# Patient Record
Sex: Female | Born: 1950 | Race: White | Hispanic: No | Marital: Married | Smoking: Current every day smoker
Health system: Southern US, Community
[De-identification: ages and names within clinical notes are randomized; demographics above are authoritative.]

## PROBLEM LIST (undated history)

## (undated) DIAGNOSIS — M1990 Unspecified osteoarthritis, unspecified site: Secondary | ICD-10-CM

## (undated) DIAGNOSIS — M545 Low back pain, unspecified: Secondary | ICD-10-CM

## (undated) DIAGNOSIS — Z8709 Personal history of other diseases of the respiratory system: Secondary | ICD-10-CM

## (undated) DIAGNOSIS — K635 Polyp of colon: Secondary | ICD-10-CM

## (undated) DIAGNOSIS — D649 Anemia, unspecified: Secondary | ICD-10-CM

## (undated) DIAGNOSIS — F32A Depression, unspecified: Secondary | ICD-10-CM

## (undated) DIAGNOSIS — C50919 Malignant neoplasm of unspecified site of unspecified female breast: Secondary | ICD-10-CM

## (undated) DIAGNOSIS — R0603 Acute respiratory distress: Secondary | ICD-10-CM

## (undated) DIAGNOSIS — G4700 Insomnia, unspecified: Secondary | ICD-10-CM

## (undated) DIAGNOSIS — F909 Attention-deficit hyperactivity disorder, unspecified type: Secondary | ICD-10-CM

## (undated) DIAGNOSIS — F329 Major depressive disorder, single episode, unspecified: Secondary | ICD-10-CM

## (undated) DIAGNOSIS — M300 Polyarteritis nodosa: Secondary | ICD-10-CM

## (undated) DIAGNOSIS — K648 Other hemorrhoids: Secondary | ICD-10-CM

## (undated) DIAGNOSIS — J189 Pneumonia, unspecified organism: Secondary | ICD-10-CM

## (undated) DIAGNOSIS — G8929 Other chronic pain: Secondary | ICD-10-CM

## (undated) DIAGNOSIS — K5790 Diverticulosis of intestine, part unspecified, without perforation or abscess without bleeding: Secondary | ICD-10-CM

## (undated) DIAGNOSIS — K219 Gastro-esophageal reflux disease without esophagitis: Secondary | ICD-10-CM

## (undated) DIAGNOSIS — F419 Anxiety disorder, unspecified: Secondary | ICD-10-CM

## (undated) DIAGNOSIS — IMO0002 Reserved for concepts with insufficient information to code with codable children: Secondary | ICD-10-CM

## (undated) DIAGNOSIS — R519 Headache, unspecified: Secondary | ICD-10-CM

## (undated) DIAGNOSIS — M7070 Other bursitis of hip, unspecified hip: Secondary | ICD-10-CM

## (undated) DIAGNOSIS — G4730 Sleep apnea, unspecified: Secondary | ICD-10-CM

## (undated) DIAGNOSIS — R51 Headache: Secondary | ICD-10-CM

## (undated) DIAGNOSIS — Z9221 Personal history of antineoplastic chemotherapy: Secondary | ICD-10-CM

## (undated) DIAGNOSIS — R911 Solitary pulmonary nodule: Secondary | ICD-10-CM

## (undated) HISTORY — PX: EYE SURGERY: SHX253

## (undated) HISTORY — PX: FRACTURE SURGERY: SHX138

## (undated) HISTORY — PX: JOINT REPLACEMENT: SHX530

## (undated) HISTORY — DX: Depression, unspecified: F32.A

## (undated) HISTORY — PX: CATARACT EXTRACTION, BILATERAL: SHX1313

## (undated) HISTORY — PX: TOTAL SHOULDER REPLACEMENT: SUR1217

## (undated) HISTORY — DX: Other hemorrhoids: K64.8

## (undated) HISTORY — DX: Attention-deficit hyperactivity disorder, unspecified type: F90.9

## (undated) HISTORY — DX: Major depressive disorder, single episode, unspecified: F32.9

## (undated) HISTORY — DX: Diverticulosis of intestine, part unspecified, without perforation or abscess without bleeding: K57.90

## (undated) HISTORY — DX: Polyp of colon: K63.5

---

## 1969-02-14 HISTORY — PX: CHOLECYSTECTOMY: SHX55

## 1974-02-14 HISTORY — PX: ABDOMINAL HYSTERECTOMY: SHX81

## 1975-02-15 HISTORY — PX: APPENDECTOMY: SHX54

## 1999-02-15 DIAGNOSIS — Z9221 Personal history of antineoplastic chemotherapy: Secondary | ICD-10-CM

## 1999-02-15 HISTORY — DX: Personal history of antineoplastic chemotherapy: Z92.21

## 1999-02-15 HISTORY — PX: BREAST BIOPSY: SHX20

## 1999-02-15 HISTORY — PX: PORT-A-CATH REMOVAL: SHX5289

## 1999-02-15 HISTORY — PX: MASTECTOMY: SHX3

## 1999-04-05 ENCOUNTER — Other Ambulatory Visit: Admission: RE | Admit: 1999-04-05 | Discharge: 1999-04-05 | Payer: Self-pay | Admitting: General Surgery

## 1999-04-12 ENCOUNTER — Encounter (INDEPENDENT_AMBULATORY_CARE_PROVIDER_SITE_OTHER): Payer: Self-pay | Admitting: *Deleted

## 1999-04-12 ENCOUNTER — Ambulatory Visit (HOSPITAL_BASED_OUTPATIENT_CLINIC_OR_DEPARTMENT_OTHER): Admission: RE | Admit: 1999-04-12 | Discharge: 1999-04-12 | Payer: Self-pay | Admitting: General Surgery

## 1999-05-03 ENCOUNTER — Encounter: Payer: Self-pay | Admitting: General Surgery

## 1999-05-03 ENCOUNTER — Encounter (INDEPENDENT_AMBULATORY_CARE_PROVIDER_SITE_OTHER): Payer: Self-pay | Admitting: *Deleted

## 1999-05-04 ENCOUNTER — Inpatient Hospital Stay (HOSPITAL_COMMUNITY): Admission: RE | Admit: 1999-05-04 | Discharge: 1999-05-05 | Payer: Self-pay | Admitting: General Surgery

## 1999-06-07 ENCOUNTER — Encounter: Payer: Self-pay | Admitting: Oncology

## 1999-06-07 ENCOUNTER — Ambulatory Visit (HOSPITAL_COMMUNITY): Admission: RE | Admit: 1999-06-07 | Discharge: 1999-06-07 | Payer: Self-pay | Admitting: Oncology

## 1999-06-16 ENCOUNTER — Ambulatory Visit (HOSPITAL_BASED_OUTPATIENT_CLINIC_OR_DEPARTMENT_OTHER): Admission: RE | Admit: 1999-06-16 | Discharge: 1999-06-16 | Payer: Self-pay | Admitting: General Surgery

## 1999-06-16 ENCOUNTER — Encounter: Payer: Self-pay | Admitting: General Surgery

## 1999-09-07 ENCOUNTER — Ambulatory Visit (HOSPITAL_BASED_OUTPATIENT_CLINIC_OR_DEPARTMENT_OTHER): Admission: RE | Admit: 1999-09-07 | Discharge: 1999-09-07 | Payer: Self-pay | Admitting: General Surgery

## 1999-10-06 ENCOUNTER — Ambulatory Visit (HOSPITAL_BASED_OUTPATIENT_CLINIC_OR_DEPARTMENT_OTHER): Admission: RE | Admit: 1999-10-06 | Discharge: 1999-10-06 | Payer: Self-pay | Admitting: Plastic Surgery

## 2000-01-18 ENCOUNTER — Ambulatory Visit (HOSPITAL_BASED_OUTPATIENT_CLINIC_OR_DEPARTMENT_OTHER): Admission: RE | Admit: 2000-01-18 | Discharge: 2000-01-18 | Payer: Self-pay | Admitting: Plastic Surgery

## 2000-03-02 ENCOUNTER — Encounter: Admission: RE | Admit: 2000-03-02 | Discharge: 2000-05-31 | Payer: Self-pay | Admitting: General Surgery

## 2000-03-03 ENCOUNTER — Other Ambulatory Visit: Admission: RE | Admit: 2000-03-03 | Discharge: 2000-03-03 | Payer: Self-pay | Admitting: *Deleted

## 2000-03-07 ENCOUNTER — Encounter: Admission: RE | Admit: 2000-03-07 | Discharge: 2000-03-07 | Payer: Self-pay | Admitting: *Deleted

## 2000-03-21 ENCOUNTER — Encounter: Admission: RE | Admit: 2000-03-21 | Discharge: 2000-03-21 | Payer: Self-pay | Admitting: *Deleted

## 2000-06-26 ENCOUNTER — Encounter (INDEPENDENT_AMBULATORY_CARE_PROVIDER_SITE_OTHER): Payer: Self-pay | Admitting: Specialist

## 2000-06-26 ENCOUNTER — Ambulatory Visit (HOSPITAL_COMMUNITY): Admission: RE | Admit: 2000-06-26 | Discharge: 2000-06-26 | Payer: Self-pay | Admitting: Gastroenterology

## 2004-07-29 ENCOUNTER — Ambulatory Visit: Payer: Self-pay | Admitting: Oncology

## 2005-01-27 ENCOUNTER — Ambulatory Visit: Payer: Self-pay | Admitting: Oncology

## 2005-04-15 ENCOUNTER — Ambulatory Visit: Payer: Self-pay | Admitting: Oncology

## 2005-10-25 ENCOUNTER — Ambulatory Visit: Payer: Self-pay | Admitting: Oncology

## 2005-12-22 ENCOUNTER — Ambulatory Visit: Payer: Self-pay | Admitting: Oncology

## 2005-12-26 ENCOUNTER — Encounter: Payer: Self-pay | Admitting: Cardiology

## 2005-12-26 ENCOUNTER — Ambulatory Visit: Payer: Self-pay

## 2005-12-26 LAB — RESEARCH LABS

## 2006-03-23 ENCOUNTER — Encounter: Payer: Self-pay | Admitting: Cardiology

## 2006-03-23 ENCOUNTER — Ambulatory Visit: Payer: Self-pay | Admitting: Oncology

## 2006-03-23 ENCOUNTER — Ambulatory Visit: Payer: Self-pay

## 2006-06-15 HISTORY — PX: ORIF SHOULDER FRACTURE: SHX5035

## 2006-06-27 ENCOUNTER — Ambulatory Visit: Payer: Self-pay | Admitting: Oncology

## 2006-06-29 ENCOUNTER — Encounter: Payer: Self-pay | Admitting: Cardiovascular Disease

## 2006-06-29 ENCOUNTER — Ambulatory Visit: Payer: Self-pay

## 2006-09-26 ENCOUNTER — Ambulatory Visit: Payer: Self-pay | Admitting: Oncology

## 2006-09-29 ENCOUNTER — Ambulatory Visit: Payer: Self-pay

## 2006-09-29 LAB — RESEARCH LABS

## 2006-10-16 HISTORY — PX: SHOULDER SURGERY: SHX246

## 2006-11-08 ENCOUNTER — Ambulatory Visit: Payer: Self-pay | Admitting: Oncology

## 2006-11-13 LAB — RESEARCH LABS

## 2006-12-16 HISTORY — PX: TOTAL KNEE ARTHROPLASTY: SHX125

## 2006-12-26 ENCOUNTER — Ambulatory Visit: Payer: Self-pay | Admitting: Oncology

## 2007-05-17 ENCOUNTER — Ambulatory Visit: Payer: Self-pay | Admitting: Oncology

## 2007-12-11 ENCOUNTER — Ambulatory Visit: Payer: Self-pay | Admitting: Oncology

## 2007-12-13 LAB — CBC WITH DIFFERENTIAL/PLATELET
BASO%: 0.5 % (ref 0.0–2.0)
EOS%: 2.9 % (ref 0.0–7.0)
Eosinophils Absolute: 0.2 10*3/uL (ref 0.0–0.5)
HCT: 36.4 % (ref 34.8–46.6)
MCH: 30.7 pg (ref 26.0–34.0)
MCHC: 33.9 g/dL (ref 32.0–36.0)
MCV: 90.7 fL (ref 81.0–101.0)
MONO#: 0.7 10*3/uL (ref 0.1–0.9)
NEUT%: 54.8 % (ref 39.6–76.8)
RBC: 4.01 10*6/uL (ref 3.70–5.32)
WBC: 7.6 10*3/uL (ref 3.9–10.0)
lymph#: 2.5 10*3/uL (ref 0.9–3.3)

## 2007-12-31 LAB — CBC WITH DIFFERENTIAL/PLATELET
BASO%: 0.5 % (ref 0.0–2.0)
Basophils Absolute: 0 10*3/uL (ref 0.0–0.1)
EOS%: 2.9 % (ref 0.0–7.0)
HCT: 36.3 % (ref 34.8–46.6)
LYMPH%: 33.5 % (ref 14.0–48.0)
MCH: 30.8 pg (ref 26.0–34.0)
MCHC: 34.5 g/dL (ref 32.0–36.0)
MCV: 89.2 fL (ref 81.0–101.0)
MONO#: 0.6 10*3/uL (ref 0.1–0.9)
MONO%: 7.4 % (ref 0.0–13.0)
NEUT#: 4.6 10*3/uL (ref 1.5–6.5)
NEUT%: 55.7 % (ref 39.6–76.8)
Platelets: 194 10*3/uL (ref 145–400)
RBC: 4.07 10*6/uL (ref 3.70–5.32)

## 2008-04-04 ENCOUNTER — Encounter: Admission: RE | Admit: 2008-04-04 | Discharge: 2008-04-04 | Payer: Self-pay | Admitting: Gastroenterology

## 2008-04-24 ENCOUNTER — Ambulatory Visit (HOSPITAL_COMMUNITY): Admission: RE | Admit: 2008-04-24 | Discharge: 2008-04-24 | Payer: Self-pay | Admitting: Gastroenterology

## 2008-04-24 ENCOUNTER — Encounter (INDEPENDENT_AMBULATORY_CARE_PROVIDER_SITE_OTHER): Payer: Self-pay | Admitting: Gastroenterology

## 2008-06-27 ENCOUNTER — Ambulatory Visit: Payer: Self-pay | Admitting: Oncology

## 2008-09-11 ENCOUNTER — Encounter: Admission: RE | Admit: 2008-09-11 | Discharge: 2008-09-11 | Payer: Self-pay | Admitting: Gastroenterology

## 2008-09-17 ENCOUNTER — Ambulatory Visit (HOSPITAL_COMMUNITY): Admission: RE | Admit: 2008-09-17 | Discharge: 2008-09-17 | Payer: Self-pay | Admitting: Gastroenterology

## 2008-12-26 ENCOUNTER — Ambulatory Visit: Payer: Self-pay | Admitting: Oncology

## 2009-01-26 ENCOUNTER — Ambulatory Visit: Payer: Self-pay | Admitting: Oncology

## 2009-03-05 ENCOUNTER — Emergency Department (HOSPITAL_BASED_OUTPATIENT_CLINIC_OR_DEPARTMENT_OTHER): Admission: EM | Admit: 2009-03-05 | Discharge: 2009-03-05 | Payer: Self-pay | Admitting: Emergency Medicine

## 2009-03-05 ENCOUNTER — Ambulatory Visit: Payer: Self-pay | Admitting: Diagnostic Radiology

## 2009-08-24 ENCOUNTER — Emergency Department (HOSPITAL_BASED_OUTPATIENT_CLINIC_OR_DEPARTMENT_OTHER): Admission: EM | Admit: 2009-08-24 | Discharge: 2009-08-24 | Payer: Self-pay | Admitting: Emergency Medicine

## 2009-08-24 ENCOUNTER — Ambulatory Visit: Payer: Self-pay | Admitting: Diagnostic Radiology

## 2009-09-09 ENCOUNTER — Ambulatory Visit: Payer: Self-pay | Admitting: Oncology

## 2009-11-12 ENCOUNTER — Ambulatory Visit: Payer: Self-pay | Admitting: Diagnostic Radiology

## 2009-11-12 ENCOUNTER — Emergency Department (HOSPITAL_BASED_OUTPATIENT_CLINIC_OR_DEPARTMENT_OTHER): Admission: EM | Admit: 2009-11-12 | Discharge: 2009-11-12 | Payer: Self-pay | Admitting: Emergency Medicine

## 2010-03-18 ENCOUNTER — Encounter (HOSPITAL_BASED_OUTPATIENT_CLINIC_OR_DEPARTMENT_OTHER): Payer: Medicare Other

## 2010-03-18 DIAGNOSIS — C50919 Malignant neoplasm of unspecified site of unspecified female breast: Secondary | ICD-10-CM

## 2010-04-29 LAB — DIFFERENTIAL
Basophils Relative: 1 % (ref 0–1)
Eosinophils Absolute: 0.1 10*3/uL (ref 0.0–0.7)
Eosinophils Relative: 2 % (ref 0–5)
Lymphocytes Relative: 34 % (ref 12–46)
Lymphs Abs: 1.7 10*3/uL (ref 0.7–4.0)
Monocytes Absolute: 0.5 10*3/uL (ref 0.1–1.0)
Monocytes Relative: 11 % (ref 3–12)
Neutro Abs: 2.6 10*3/uL (ref 1.7–7.7)
Neutrophils Relative %: 52 % (ref 43–77)

## 2010-04-29 LAB — POCT CARDIAC MARKERS
CKMB, poc: <1 ng/mL — ABNORMAL LOW (ref 1.0–8.0)
Myoglobin, poc: 11.9 ng/mL — ABNORMAL LOW (ref 12–200)
Troponin i, poc: <0.05 ng/mL (ref 0.00–0.09)

## 2010-04-29 LAB — URINALYSIS, ROUTINE W REFLEX MICROSCOPIC
Glucose, UA: NEGATIVE mg/dL
Nitrite: NEGATIVE
Specific Gravity, Urine: 1.008 (ref 1.005–1.030)

## 2010-04-29 LAB — BASIC METABOLIC PANEL
BUN: 11 mg/dL (ref 6–23)
CO2: 29 mEq/L (ref 19–32)
Calcium: 8.8 mg/dL (ref 8.4–10.5)
Chloride: 102 mEq/L (ref 96–112)
GFR calc non Af Amer: >60 mL/min (ref >60)
Potassium: 4.4 mEq/L (ref 3.5–5.1)
Sodium: 137 mEq/L (ref 135–145)

## 2010-04-29 LAB — CBC
HCT: 34.1 % — ABNORMAL LOW (ref 36.0–46.0)
MCH: 29.6 pg (ref 26.0–34.0)
MCHC: 33.6 g/dL (ref 30.0–36.0)
RBC: 3.87 MIL/uL (ref 3.87–5.11)
WBC: 4.9 10*3/uL (ref 4.0–10.5)

## 2010-05-02 LAB — CBC
HCT: 38.6 % (ref 36.0–46.0)
Hemoglobin: 12.5 g/dL (ref 12.0–15.0)
MCH: 29.4 pg (ref 26.0–34.0)
MCHC: 32.3 g/dL (ref 30.0–36.0)
MCV: 90.8 fL (ref 78.0–100.0)
Platelets: 306 10*3/uL (ref 150–400)
RBC: 4.25 MIL/uL (ref 3.87–5.11)
RDW: 14.4 % (ref 11.5–15.5)
WBC: 8.5 10*3/uL (ref 4.0–10.5)

## 2010-05-02 LAB — DIFFERENTIAL
Basophils Relative: 1 % (ref 0–1)
Eosinophils Absolute: 0 10*3/uL (ref 0.0–0.7)
Eosinophils Relative: 0 % (ref 0–5)
Lymphs Abs: 1.5 10*3/uL (ref 0.7–4.0)
Monocytes Absolute: 0.5 10*3/uL (ref 0.1–1.0)
Monocytes Relative: 6 % (ref 3–12)
Neutro Abs: 6.4 10*3/uL (ref 1.7–7.7)

## 2010-05-02 LAB — BASIC METABOLIC PANEL
Chloride: 103 mEq/L (ref 96–112)
GFR calc Af Amer: >60 mL/min (ref >60)
GFR calc non Af Amer: >60 mL/min (ref >60)

## 2010-05-02 LAB — D-DIMER, QUANTITATIVE: D-Dimer, Quant: 1.26 ug/mL-FEU — ABNORMAL HIGH (ref 0.00–0.48)

## 2010-05-03 LAB — CBC
HCT: 39.8 % (ref 36.0–46.0)
Hemoglobin: 13.2 g/dL (ref 12.0–15.0)
MCHC: 33.2 g/dL (ref 30.0–36.0)
Platelets: 210 10*3/uL (ref 150–400)
RDW: 12.8 % (ref 11.5–15.5)
WBC: 9.1 10*3/uL (ref 4.0–10.5)

## 2010-05-03 LAB — DIFFERENTIAL
Basophils Absolute: 0.2 10*3/uL — ABNORMAL HIGH (ref 0.0–0.1)
Basophils Relative: 3 % — ABNORMAL HIGH (ref 0–1)
Lymphocytes Relative: 20 % (ref 12–46)
Lymphs Abs: 1.8 10*3/uL (ref 0.7–4.0)
Monocytes Relative: 9 % (ref 3–12)
Neutrophils Relative %: 68 % (ref 43–77)

## 2010-05-03 LAB — URINE MICROSCOPIC-ADD ON

## 2010-05-03 LAB — COMPREHENSIVE METABOLIC PANEL
ALT: 21 U/L (ref 0–35)
AST: 19 U/L (ref 0–37)
Albumin: 3.7 g/dL (ref 3.5–5.2)
Alkaline Phosphatase: 106 U/L (ref 39–117)
Calcium: 8.4 mg/dL (ref 8.4–10.5)
Creatinine, Ser: 0.6 mg/dL (ref 0.4–1.2)
GFR calc non Af Amer: >60 mL/min (ref >60)
Glucose, Bld: 93 mg/dL (ref 70–99)
Sodium: 142 mEq/L (ref 135–145)
Total Bilirubin: 0.4 mg/dL (ref 0.3–1.2)
Total Protein: 6.7 g/dL (ref 6.0–8.3)

## 2010-05-03 LAB — LIPASE, BLOOD: Lipase: 55 U/L (ref 23–300)

## 2010-05-03 LAB — URINALYSIS, ROUTINE W REFLEX MICROSCOPIC
Bilirubin Urine: NEGATIVE
Glucose, UA: NEGATIVE mg/dL
Hgb urine dipstick: NEGATIVE
Ketones, ur: NEGATIVE mg/dL
Nitrite: NEGATIVE
Protein, ur: NEGATIVE mg/dL
Specific Gravity, Urine: 1.012 (ref 1.005–1.030)

## 2010-05-03 LAB — URINE CULTURE: Colony Count: NO GROWTH

## 2010-05-14 ENCOUNTER — Emergency Department (HOSPITAL_BASED_OUTPATIENT_CLINIC_OR_DEPARTMENT_OTHER)
Admission: EM | Admit: 2010-05-14 | Discharge: 2010-05-14 | Disposition: A | Discharge status: Home / Self Care | Payer: Medicare Other | Attending: Emergency Medicine | Admitting: Emergency Medicine

## 2010-05-14 ENCOUNTER — Emergency Department (INDEPENDENT_AMBULATORY_CARE_PROVIDER_SITE_OTHER): Payer: Medicare Other

## 2010-05-14 DIAGNOSIS — F17200 Nicotine dependence, unspecified, uncomplicated: Secondary | ICD-10-CM | POA: Insufficient documentation

## 2010-05-14 DIAGNOSIS — R109 Unspecified abdominal pain: Secondary | ICD-10-CM

## 2010-05-14 DIAGNOSIS — M549 Dorsalgia, unspecified: Secondary | ICD-10-CM | POA: Insufficient documentation

## 2010-05-14 DIAGNOSIS — IMO0002 Reserved for concepts with insufficient information to code with codable children: Secondary | ICD-10-CM | POA: Insufficient documentation

## 2010-05-14 DIAGNOSIS — G8929 Other chronic pain: Secondary | ICD-10-CM | POA: Insufficient documentation

## 2010-05-14 DIAGNOSIS — N390 Urinary tract infection, site not specified: Secondary | ICD-10-CM | POA: Insufficient documentation

## 2010-05-14 DIAGNOSIS — K5730 Diverticulosis of large intestine without perforation or abscess without bleeding: Secondary | ICD-10-CM

## 2010-05-14 DIAGNOSIS — M62838 Other muscle spasm: Secondary | ICD-10-CM | POA: Insufficient documentation

## 2010-05-14 DIAGNOSIS — K219 Gastro-esophageal reflux disease without esophagitis: Secondary | ICD-10-CM | POA: Insufficient documentation

## 2010-05-14 LAB — URINALYSIS, ROUTINE W REFLEX MICROSCOPIC
Hgb urine dipstick: NEGATIVE
Ketones, ur: 15 mg/dL — AB
Nitrite: NEGATIVE
Protein, ur: 30 mg/dL — AB
Specific Gravity, Urine: 1.024 (ref 1.005–1.030)
Urobilinogen, UA: 1 mg/dL (ref 0.0–1.0)

## 2010-05-14 LAB — BASIC METABOLIC PANEL
Calcium: 9.2 mg/dL (ref 8.4–10.5)
Chloride: 105 mEq/L (ref 96–112)
GFR calc Af Amer: >60 mL/min (ref >60)
GFR calc non Af Amer: >60 mL/min (ref >60)
Potassium: 4.2 mEq/L (ref 3.5–5.1)

## 2010-05-14 LAB — CBC
MCHC: 33.9 g/dL (ref 30.0–36.0)
MCV: 88.6 fL (ref 78.0–100.0)
Platelets: 199 10*3/uL (ref 150–400)
RDW: 14.6 % (ref 11.5–15.5)
WBC: 10.2 10*3/uL (ref 4.0–10.5)

## 2010-05-14 LAB — DIFFERENTIAL
Eosinophils Absolute: 0.2 10*3/uL (ref 0.0–0.7)
Eosinophils Relative: 2 % (ref 0–5)
Lymphocytes Relative: 22 % (ref 12–46)
Monocytes Relative: 11 % (ref 3–12)
Neutro Abs: 6.6 10*3/uL (ref 1.7–7.7)
Neutrophils Relative %: 65 % (ref 43–77)

## 2010-05-14 LAB — URINE MICROSCOPIC-ADD ON

## 2010-05-15 ENCOUNTER — Emergency Department (INDEPENDENT_AMBULATORY_CARE_PROVIDER_SITE_OTHER): Payer: Medicare Other

## 2010-05-15 ENCOUNTER — Emergency Department (HOSPITAL_BASED_OUTPATIENT_CLINIC_OR_DEPARTMENT_OTHER)
Admission: EM | Admit: 2010-05-15 | Discharge: 2010-05-15 | Disposition: A | Discharge status: Home / Self Care | Payer: Medicare Other | Attending: Emergency Medicine | Admitting: Emergency Medicine

## 2010-05-15 DIAGNOSIS — M549 Dorsalgia, unspecified: Secondary | ICD-10-CM

## 2010-05-15 DIAGNOSIS — G8929 Other chronic pain: Secondary | ICD-10-CM | POA: Insufficient documentation

## 2010-05-15 DIAGNOSIS — IMO0002 Reserved for concepts with insufficient information to code with codable children: Secondary | ICD-10-CM | POA: Insufficient documentation

## 2010-05-15 DIAGNOSIS — K219 Gastro-esophageal reflux disease without esophagitis: Secondary | ICD-10-CM | POA: Insufficient documentation

## 2010-05-15 DIAGNOSIS — M5380 Other specified dorsopathies, site unspecified: Secondary | ICD-10-CM | POA: Insufficient documentation

## 2010-05-15 DIAGNOSIS — R29898 Other symptoms and signs involving the musculoskeletal system: Secondary | ICD-10-CM

## 2010-05-15 DIAGNOSIS — Z853 Personal history of malignant neoplasm of breast: Secondary | ICD-10-CM

## 2010-05-15 DIAGNOSIS — F17200 Nicotine dependence, unspecified, uncomplicated: Secondary | ICD-10-CM | POA: Insufficient documentation

## 2010-05-27 LAB — HEMOGLOBIN AND HEMATOCRIT, BLOOD
HCT: 40.4 % (ref 36.0–46.0)
Hemoglobin: 13.5 g/dL (ref 12.0–15.0)

## 2010-06-29 NOTE — Op Note (Signed)
[name], Lindsey Rose                 ACCOUNT NO.:  [id number]   MEDICAL RECORD NO.:  [id number]          PATIENT TYPE:  AMB   LOCATION:  ENDO                         FACILITY:  [facility]   PHYSICIAN:  Bernette Redbird, M.D.   DATE OF BIRTH:  [birth year]   DATE OF PROCEDURE:  04/24/2008  DATE OF DISCHARGE:                               OPERATIVE REPORT   PROCEDURE:  Upper endoscopy with Savary dilatation of the esophagus  under fluoroscopy.   INDICATIONS:  A 60 year old female with dysphagia symptoms and  radiographically documented impediment of passage of a barium tablet.   FINDINGS:  Essentially normal exam.  Some bile reflux present.  No  discrete ring or stricture identified.  Dilatation performed to 18 mm.   DESCRIPTION OF PROCEDURE:  The procedure had been discussed with the  patient who provided written consent after coming as an outpatient to  the endoscopy unit at [facility].  Sedation was propofol and  Versed by the Anesthesia Department.  The Pentax video endoscope was  passed under direct vision, entering the esophagus without difficulty.  On the way out, I was able to see the vocal cords well and they appeared  normal.   The esophagus was endoscopically normal.  No reflux esophagitis,  Barrett's esophagus, varices, infection or neoplasia were observed.  A  very small hiatal hernia was present and there was some suggestion of a  ring at the squamocolumnar junction, but no discrete ring and no high-  grade stenosis.  There was perhaps a little bit of resistance to passage  of the endoscope, perhaps due to muscular spasm in that region.   The stomach contained a moderate bilious residual.  This was suctioned  up.  No gastritis, erosions, ulcers, polyps or masses were observed,  including a retroflexed view of the cardia.  The pylorus, duodenal bulb  and second duodenum looked normal.   Savary dilatation was then performed in the standard fashion.  The  spring tipped  guidewire was passed through the scope, and the scope was  removed in an exchange fashion, leaving the guidewire in place.  Sequential dilatation was then performed using Savary dilators, sizes 16  and 18 mm, each time confirming appropriate positioning of the wire and  passage of the widest portion of the dilator below the level of the  diaphragm.  There was no discrete pop or click as the dilators were  passed, and no significant resistance.   The patient was then re-endoscoped under direct vision.  There was no  mucosal disruption observed, specifically, no fracture of a ring, nor  any evidence of undue trauma to the hypopharynx, esophagus or stomach.  The scope was removed from the patient who tolerated the procedure well  and without apparent complication.   IMPRESSION:  1. Dysphagia symptoms without clearly defined source on current      examination.  2. Empiric dilatation to 18 mm by Savary technique performed as      described above.  3. Bile reflux of doubtful clinical significance, without obvious bile      gastritis.  PLAN:  Clinical follow-up of dysphagia symptoms.           ______________________________  Bernette Redbird, M.D.     RB/MEDQ  D:  04/24/2008  T:  04/24/2008  Job:  [id number]   cc:   Dalbert Mayotte, M.D.

## 2010-06-29 NOTE — Op Note (Signed)
Lindsey Rose, Lindsey Rose                 ACCOUNT NO.:  [id number]   MEDICAL RECORD NO.:  [id number]          PATIENT TYPE:  AMB   LOCATION:  ENDO                         [facility]ACILITY:  [facility]   PHYSICIAN:  Bernette Redbird, M.D.   DATE OF BIRTH:  Feb 28, 1950   DATE OF PROCEDURE:  04/24/2008  DATE OF DISCHARGE:                               OPERATIVE REPORT   PROCEDURE:  Colonoscopy with polypectomy and biopsy.   INDICATIONS:  A 60 year old female for follow-up of previous colonic  adenomas.   FINDINGS:  Several polyps removed.  Mild sigmoid diverticulosis.   DESCRIPTION OF PROCEDURE:  The nature, purpose and risks of the  procedure were familiar to the patient from prior examination and she  provided written consent.  Sedation was propofol and Versed by the  Anesthesia Department.  The Pentax pediatric video colonoscope was  advanced without too much difficulty through a somewhat sigmoid fixated  region and then with some difficulty in the proximal colon due to  significant looping which was overcome by external abdominal  compression, allowing [place] to reach the base of the cecum as identified by  the absence of further lumen and visualization of the ileocecal valve.  Pullback was then performed.  The quality of the prep was very good and  it is felt that all areas were well seen.   On the way in, I found a small sessile polyp near the distal transverse  colon that was removed by several cold biopsies.  A somewhat larger  polyp in the proximal colon was removed by cold snare, measuring  approximately 3 x 5 mm.  In the distal transverse colon, I encountered  another small polyp and finally near the splenic flexure yet another,  each removed by cold snare.  It is felt that all polyp fragments were  successfully retrieved with the possible exception of one.   No large polyps, cancer, colitis or vascular ectasia were noted.  There  was mild sigmoid diverticulosis.   Retroflexion in the rectum was  normal as was re-inspection of the  rectum.  The patient tolerated the procedure well and there were no  apparent complications.   IMPRESSION:  Multiple polyps removed as described above.   PLAN:  Await pathology results.  The fact that she has formed a large  number of polyps, albeit small in size, in the 2-1/2 years since her  last colonoscopy would imply that she probably will need fairly early  colonoscopic follow-up.           ______________________________  Bernette Redbird, M.D.     RB/MEDQ  D:  04/24/2008  T:  04/24/2008  Job:  [id number]   cc:   Dalbert Mayotte, M.D.

## 2010-07-02 NOTE — Consult Note (Signed)
[facility]  Patient:    Lindsey Rose, Lindsey Rose                          MRN: [id number] Proc. Date: 05/05/99 Adm. Date:  [id number] Attending:  Janalyn Rouse CC:         Alfredia Ferguson, M.D.             Winn Jock. Charmian Muff, M.D.             Rose Phi. Maple Hudson, M.D.                          Consultation Report  REFERRING:  Rose Phi. Maple Hudson, M.D.  IDENTIFICATION:  Lindsey Rose is a 60 year old with a new diagnosis of invasive breast cancer.  HISTORY OF PRESENT ILLNESS:  Lindsey Rose has a strong family history for invasive  breast cancer.  She had a screening mammogram at [facility] on March 23, 1999 that revealed a focal area of asymmetry with architectural distortion and scattered calcifications in the left breast.  She was referred for additional mammographic views and an ultrasound on March 25, 1999 and this confirmed a spiculated mass in the left breast with features suspicious for carcinoma.  She was referred to Dr. Francina Ames and a mass was palpated in the left breast nd she underwent a fine-needle aspiration biopsy ([id number]) that revealed findings suspicious for lobular carcinoma.  She was taken to an excisional biopsy procedure ([id number]) at [facility] on April 12, 1999 and this confirmed a 2 cm invasive lobular carcinoma with positive surgical margins.  There was a suspicion for vascular/lymphatic space invasion and there was a less than 5% in situ component.  The tumor returned positive for estrogen (96%) and negative for progesterone receptors.  The HER-2/neu stain returned positive at 3+ with a DNA index of 1.63 and an S phase fraction f 3.2%.  After further discussion with Dr. Maple Hudson and Dr. Delia Chimes, she was taken to he operating room and underwent bilateral mastectomy procedure and left-sided sentinel lymph node biopsy at [facility] on May 03, 1999.  The final pathology from this procedure is pending.  Lindsey Rose states that  she felt well prior to the recent surgery.  She had not noted any change over the left breast.  She denies pain.  PAST MEDICAL HISTORY:  G2, P2.  On Prempro for approximately the past five to six years.  PAST SURGICAL HISTORY: 1. Status post hysterectomy in 1976. 2. Cholecystectomy March 1993.  MEDICATIONS ON ADMISSION:  Prempro.  ALLERGIES:  ALEVE caused a rash and "swelling."  REVIEW OF SYSTEMS:  Unremarkable.  FAMILY HISTORY:  She has six sisters and one brother.  Three sisters have been diagnosed with breast cancer; one at age 50 and two (twins) at age 17.  A niece was recently diagnosed with breast cancer at age 48.  Her father died at age 43 with gastric carcinoma and a paternal aunt also was diagnosed with gastric cancer. o other family history of cancer including uterine, colorectal, lung, and ovarian  carcinoma.  SOCIAL HISTORY:  She works in an IT sales professional.  She smokes one pack of cigarettes per day and she has for 30 years.  She is a moderate alcohol drinker and stated today that she drank up to a six-pack of beer each night.  PHYSICAL EXAMINATION:  VITAL SIGNS:  Temperature 98.3, blood pressure 128/75, pulse 80,  respirations 18.  HEENT:  Oropharynx without visible mass lesions.  NECK:  Without palpable mass.  LUNGS:  Clear except for rales at the bases on end-inspiration.  No distress.  CARDIAC:  Regular rate and rhythm.  No gallop.  BREAST:  Status post bilateral mastectomies with healing incisions and tissue expanders in place.  She has bilateral Jackson-Pratt drains in place.  ABDOMEN:  Soft.  No organomegaly.  LYMPH NODES:  No palpable cervical, clavicular, or axillary nodes.  LABORATORY DATA:  From April 29, 1999:  Hemoglobin 13.4, white count 9.9, platelets 277,000.  BUN 10, creatinine 0.6, calcium 9.4.  Chest x-ray from March 31, 1999:  Normal.  IMPRESSIONS AND RECOMMENDATIONS:  Lindsey Rose is a 60 year old with an  extensive family history for invasive breast cancer, who has been diagnosed with an invasive lobular carcinoma of the left breast.  She has undergone a left mastectomy and prophylactic right mastectomy with a left-sided sentinel lymph node biopsy. The final pathology report from the mastectomy/sentinel lymph node procedure is pending at this time.  I had a general discussion with the patient and her husband today regarding her  diagnosis of invasive breast cancer and adjuvant treatment options.  I will recommend a five-year course of adjuvant tamoxifen therapy, given the hormone receptor positive nature of the lobular carcinoma.  Depending on the final pathology from both breasts and the sentinel lymph node biopsy, we will consider the indication of adjuvant systemic chemotherapy.  She has been evaluated in the genetics screening clinic at the [facility] and her family is currently undergoing testing for the BRCA-1/2 mutations.  I will plan to see her back in the office within the next few weeks to make a decision on adjuvant therapy. DD:  05/05/99 TD:  05/05/99 Job: [id number]

## 2011-01-19 ENCOUNTER — Other Ambulatory Visit: Payer: Self-pay

## 2011-01-19 ENCOUNTER — Inpatient Hospital Stay (HOSPITAL_BASED_OUTPATIENT_CLINIC_OR_DEPARTMENT_OTHER)
Admission: EM | Admit: 2011-01-19 | Discharge: 2011-01-21 | DRG: 315 | Disposition: A | Discharge status: Home / Self Care | Payer: Medicare Other | Attending: Internal Medicine | Admitting: Internal Medicine

## 2011-01-19 ENCOUNTER — Encounter: Payer: Self-pay | Admitting: Family Medicine

## 2011-01-19 ENCOUNTER — Emergency Department (INDEPENDENT_AMBULATORY_CARE_PROVIDER_SITE_OTHER): Payer: Medicare Other

## 2011-01-19 DIAGNOSIS — I951 Orthostatic hypotension: Secondary | ICD-10-CM | POA: Diagnosis present

## 2011-01-19 DIAGNOSIS — F32A Depression, unspecified: Secondary | ICD-10-CM | POA: Diagnosis present

## 2011-01-19 DIAGNOSIS — N390 Urinary tract infection, site not specified: Secondary | ICD-10-CM | POA: Diagnosis present

## 2011-01-19 DIAGNOSIS — M51369 Other intervertebral disc degeneration, lumbar region without mention of lumbar back pain or lower extremity pain: Secondary | ICD-10-CM | POA: Diagnosis present

## 2011-01-19 DIAGNOSIS — M545 Low back pain, unspecified: Secondary | ICD-10-CM | POA: Diagnosis present

## 2011-01-19 DIAGNOSIS — Z853 Personal history of malignant neoplasm of breast: Secondary | ICD-10-CM

## 2011-01-19 DIAGNOSIS — R001 Bradycardia, unspecified: Secondary | ICD-10-CM | POA: Diagnosis present

## 2011-01-19 DIAGNOSIS — R5381 Other malaise: Secondary | ICD-10-CM

## 2011-01-19 DIAGNOSIS — I498 Other specified cardiac arrhythmias: Secondary | ICD-10-CM | POA: Diagnosis present

## 2011-01-19 DIAGNOSIS — I959 Hypotension, unspecified: Principal | ICD-10-CM | POA: Diagnosis present

## 2011-01-19 DIAGNOSIS — G8929 Other chronic pain: Secondary | ICD-10-CM | POA: Diagnosis present

## 2011-01-19 DIAGNOSIS — F419 Anxiety disorder, unspecified: Secondary | ICD-10-CM | POA: Diagnosis present

## 2011-01-19 DIAGNOSIS — F341 Dysthymic disorder: Secondary | ICD-10-CM | POA: Diagnosis present

## 2011-01-19 DIAGNOSIS — M51379 Other intervertebral disc degeneration, lumbosacral region without mention of lumbar back pain or lower extremity pain: Secondary | ICD-10-CM | POA: Diagnosis present

## 2011-01-19 DIAGNOSIS — E860 Dehydration: Secondary | ICD-10-CM | POA: Diagnosis present

## 2011-01-19 DIAGNOSIS — M5136 Other intervertebral disc degeneration, lumbar region: Secondary | ICD-10-CM | POA: Diagnosis present

## 2011-01-19 DIAGNOSIS — M5137 Other intervertebral disc degeneration, lumbosacral region: Secondary | ICD-10-CM | POA: Diagnosis present

## 2011-01-19 DIAGNOSIS — R5383 Other fatigue: Secondary | ICD-10-CM

## 2011-01-19 DIAGNOSIS — F329 Major depressive disorder, single episode, unspecified: Secondary | ICD-10-CM | POA: Diagnosis present

## 2011-01-19 DIAGNOSIS — R42 Dizziness and giddiness: Secondary | ICD-10-CM

## 2011-01-19 HISTORY — DX: Malignant neoplasm of unspecified site of unspecified female breast: C50.919

## 2011-01-19 HISTORY — DX: Unspecified osteoarthritis, unspecified site: M19.90

## 2011-01-19 HISTORY — DX: Anxiety disorder, unspecified: F41.9

## 2011-01-19 HISTORY — DX: Reserved for concepts with insufficient information to code with codable children: IMO0002

## 2011-01-19 LAB — URINALYSIS, ROUTINE W REFLEX MICROSCOPIC
Glucose, UA: NEGATIVE mg/dL
Hgb urine dipstick: NEGATIVE
Nitrite: NEGATIVE
Protein, ur: NEGATIVE mg/dL
Specific Gravity, Urine: 1.023 (ref 1.005–1.030)
pH: 6 (ref 5.0–8.0)

## 2011-01-19 LAB — BASIC METABOLIC PANEL
BUN: 20 mg/dL (ref 6–23)
CO2: 25 mEq/L (ref 19–32)
Calcium: 9 mg/dL (ref 8.4–10.5)
Chloride: 104 mEq/L (ref 96–112)
Creatinine, Ser: 0.8 mg/dL (ref 0.50–1.10)
GFR calc Af Amer: 90 mL/min — ABNORMAL LOW (ref >90)
Glucose, Bld: 107 mg/dL — ABNORMAL HIGH (ref 70–99)
Potassium: 3.8 mEq/L (ref 3.5–5.1)

## 2011-01-19 LAB — CBC
HCT: 36.8 % (ref 36.0–46.0)
Hemoglobin: 12.1 g/dL (ref 12.0–15.0)
RBC: 4.08 MIL/uL (ref 3.87–5.11)
WBC: 6.7 10*3/uL (ref 4.0–10.5)

## 2011-01-19 LAB — URINE MICROSCOPIC-ADD ON

## 2011-01-19 LAB — LACTIC ACID, PLASMA: Lactic Acid, Venous: 0.7 mmol/L (ref 0.5–2.2)

## 2011-01-19 LAB — CARDIAC PANEL(CRET KIN+CKTOT+MB+TROPI)
Relative Index: INVALID (ref 0.0–2.5)
Troponin I: <0.3 ng/mL (ref <0.30)

## 2011-01-19 MED ORDER — CELECOXIB 100 MG PO CAPS
100.0000 mg | ORAL_CAPSULE | Freq: Two times a day (BID) | ORAL | Status: DC
Start: 2011-01-19 — End: 2011-01-21
  Administered 2011-01-20 – 2011-01-21 (×4): 100 mg via ORAL
  Filled 2011-01-19 (×6): qty 1

## 2011-01-19 MED ORDER — OXYCODONE HCL 5 MG PO TABS
15.0000 mg | ORAL_TABLET | Freq: Every day | ORAL | Status: DC
  Administered 2011-01-20: 15 mg via ORAL
  Filled 2011-01-19 (×2): qty 3

## 2011-01-19 MED ORDER — VANCOMYCIN HCL IN DEXTROSE 1-5 GM/200ML-% IV SOLN
1000.0000 mg | Freq: Once | INTRAVENOUS | Status: AC
  Administered 2011-01-19 (×2): 1000 mg via INTRAVENOUS
  Filled 2011-01-19: qty 200

## 2011-01-19 MED ORDER — SENNA 8.6 MG PO TABS
1.0000 | ORAL_TABLET | Freq: Two times a day (BID) | ORAL | Status: DC
  Administered 2011-01-20 (×3): 8.6 mg via ORAL
  Filled 2011-01-19 (×5): qty 1

## 2011-01-19 MED ORDER — SODIUM CHLORIDE 0.9 % IV BOLUS (SEPSIS)
1000.0000 mL | Freq: Once | INTRAVENOUS | Status: AC
  Administered 2011-01-19: 1000 mL via INTRAVENOUS

## 2011-01-19 MED ORDER — FLUTICASONE PROPIONATE 50 MCG/ACT NA SUSP
1.0000 | Freq: Every day | NASAL | Status: DC
  Administered 2011-01-20 – 2011-01-21 (×2): 1 via NASAL
  Filled 2011-01-19: qty 16

## 2011-01-19 MED ORDER — MUSCLE RUB 10-15 % EX CREA
TOPICAL_CREAM | CUTANEOUS | Status: DC | PRN
  Filled 2011-01-19: qty 85

## 2011-01-19 MED ORDER — TROLAMINE SALICYLATE 10 % EX LOTN: 1.0000 "application " | TOPICAL_LOTION | CUTANEOUS | Status: DC | PRN

## 2011-01-19 MED ORDER — FLUOXETINE HCL 20 MG PO CAPS
20.0000 mg | ORAL_CAPSULE | Freq: Every day | ORAL | Status: DC
  Administered 2011-01-20 – 2011-01-21 (×2): 20 mg via ORAL
  Filled 2011-01-19 (×2): qty 1

## 2011-01-19 MED ORDER — PIPERACILLIN-TAZOBACTAM 3.375 G IVPB
3.3750 g | Freq: Once | INTRAVENOUS | Status: AC
  Administered 2011-01-19: 3.375 g via INTRAVENOUS
  Filled 2011-01-19: qty 50

## 2011-01-19 MED ORDER — SODIUM CHLORIDE 0.9 % IV SOLN
INTRAVENOUS | Status: DC
  Administered 2011-01-19: via INTRAVENOUS

## 2011-01-19 MED ORDER — OXYCODONE HCL 10 MG PO TB12
30.0000 mg | ORAL_TABLET | Freq: Three times a day (TID) | ORAL | Status: DC
  Administered 2011-01-20: 30 mg via ORAL
  Filled 2011-01-19 (×2): qty 3

## 2011-01-19 MED ORDER — SODIUM CHLORIDE 0.9 % IV SOLN: INTRAVENOUS | Status: DC

## 2011-01-19 MED ORDER — DEXTROSE 5 % IV SOLN
1.0000 g | INTRAVENOUS | Status: DC
  Administered 2011-01-20 – 2011-01-21 (×2): 1 g via INTRAVENOUS
  Filled 2011-01-19 (×2): qty 10

## 2011-01-19 MED ORDER — ALBUTEROL SULFATE HFA 108 (90 BASE) MCG/ACT IN AERS
2.0000 | INHALATION_SPRAY | Freq: Four times a day (QID) | RESPIRATORY_TRACT | Status: DC | PRN
  Filled 2011-01-19: qty 6.7

## 2011-01-19 MED ORDER — ENOXAPARIN SODIUM 40 MG/0.4ML ~~LOC~~ SOLN
40.0000 mg | SUBCUTANEOUS | Status: DC
  Administered 2011-01-20 – 2011-01-21 (×2): 40 mg via SUBCUTANEOUS
  Filled 2011-01-19 (×2): qty 0.4

## 2011-01-19 NOTE — ED Notes (Signed)
Report given to Shon Hale RN on 2602

## 2011-01-19 NOTE — ED Notes (Signed)
Pt. To have only one set of blood cultures done.  Pt. Will need lactic acid drawn

## 2011-01-19 NOTE — H&P (Signed)
PCP:  Dalbert Mayotte, MD, MD   DOA:  01/19/2011  4:16 PM  Chief Complaint:  Dizziness and fatigue  HPI:  Patient is 60 year old female with past medical history outlined below who presents to [facility] transferred from [facility] for further evaluation of her dizziness and fatigue that initially started morning of admission. Upon admission to ED she was found to be hypotensive with a pressure of 80/40. She reports being in her usual state of health yesterday and woke up feeling tired with dizziness. She has had increasing pain in her left shoulder and she reports this is chronic pain for her. She denies chest pain, palpitations, shortness of breath, and no cough, no fevers or chills. She also denies specific abdominal or urinary concerns and no focal weakness. Patient also denies headaches, visual changes, syncopal episodes. She denies recent significant hospitalizations, no sick contacts or exposures.  Allergies: Allergies  Allergen Reactions  . Ivp Dye (Iodinated Diagnostic Agents) Hives and Shortness Of Breath  . Aleve Hives    Prior to Admission medications   Medication Sig Start Date End Date Taking? Authorizing Provider  albuterol (PROVENTIL HFA;VENTOLIN HFA) 108 (90 BASE) MCG/ACT inhaler Inhale 2 puffs into the lungs every 6 (six) hours as needed. For shortness of breath and wheezing    Yes Historical Provider, MD  celecoxib (CELEBREX) 100 MG capsule Take 100 mg by mouth 2 (two) times daily.     Yes Historical Provider, MD  FLUoxetine (PROZAC) 20 MG capsule Take 20 mg by mouth daily.     Yes Historical Provider, MD  fluticasone (FLONASE) 50 MCG/ACT nasal spray Place 1 spray into both nostrils daily.     Yes Historical Provider, MD  Multiple Vitamin (MULTIVITAMIN) tablet Take 1 tablet by mouth daily.     Yes Historical Provider, MD  oxycodone (OXYCONTIN) 30 MG TB12 Take 30 mg by mouth 3 (three) times daily.     Yes Historical Provider, MD  oxyCODONE (ROXICODONE) 15  MG immediate release tablet Take 15 mg by mouth 6 (six) times daily.     Yes Historical Provider, MD  Trolamine Salicylate (ASPERCREME) 10 % LOTN Apply topically as needed. For pain     Yes Historical Provider, MD  VITAMIN D, CHOLECALCIFEROL, PO Take 2,500 mg by mouth daily.     Yes Historical Provider, MD    Past Medical History  Diagnosis Date  . Cancer   . Breast cancer   . Arthritis   . Disc degeneration   . Anxiety     Past Surgical History  Procedure Date  . Breast surgery   . Abdominal hysterectomy   . Cholecystectomy   . Joint replacement     Social History:  reports that she has been smoking.  She does not have any smokeless tobacco history on file. She reports that she drinks alcohol. She reports that she does not use illicit drugs.  History reviewed. No pertinent family history.  Review of Systems:  Constitutional: Denies fever, chills, diaphoresis, appetite change and fatigue.  HEENT: Denies photophobia, eye pain, redness, hearing loss, ear pain, congestion, sore throat, rhinorrhea, sneezing, mouth sores, trouble swallowing, neck pain, neck stiffness and tinnitus.   Respiratory: Denies SOB, DOE, cough, chest tightness,  and wheezing.   Cardiovascular: Denies chest pain, palpitations and leg swelling.  Gastrointestinal: Denies nausea, vomiting, abdominal pain, diarrhea, constipation, blood in stool and abdominal distention.  Genitourinary: Denies dysuria, urgency, frequency, hematuria, flank pain and difficulty urinating.  Musculoskeletal: Denies myalgias, back pain,  joint swelling, arthralgias and gait problem.  Skin: Denies pallor, rash and wound.  Neurological: HPI Hematological: Denies adenopathy. Easy bruising, personal or family bleeding history  Psychiatric/Behavioral: Denies suicidal ideation, mood changes, confusion, nervousness, sleep disturbance and agitation   Physical Exam:  Filed Vitals:   01/19/11 1846 01/19/11 1953 01/19/11 2045 01/19/11 2205    BP: 105/60  144/68 153/52  Pulse: 52 71 55 55  Temp:      TempSrc:      Resp: 18  20   Height:      Weight:      SpO2: 100% 100% 99% 99%    Constitutional: Vital signs reviewed.  Patient is a well-developed and well-nourished in no acute distress and cooperative with exam. Alert and oriented x3.  Head: Normocephalic and atraumatic Ear: TM normal bilaterally Mouth: no erythema or exudates, MMM Eyes: PERRL, EOMI, conjunctivae normal, No scleral icterus.  Neck: Supple, Trachea midline normal ROM, No JVD, mass, thyromegaly, or carotid bruit present.  Cardiovascular: regular rhythm but bradycardic, S1 normal, S2 normal, no MRG, pulses symmetric and intact bilaterally Pulmonary/Chest: CTAB, no wheezes, rales, or rhonchi Abdominal: Soft. Non-tender, non-distended, bowel sounds are normal, no masses, organomegaly, or guarding present.  GU: no CVA tenderness Musculoskeletal: No joint deformities, erythema, or stiffness, ROM full and no nontender Ext: no edema and no cyanosis, pulses palpable bilaterally (DP and PT) Hematology: no cervical, inginal, or axillary adenopathy.  Neurological: A&O x3, Strenght is normal and symmetric bilaterally, cranial nerve II-XII are grossly intact, no focal motor deficit, sensory intact to light touch bilaterally.  Skin: Warm, dry and intact. No rash, cyanosis, or clubbing.  Psychiatric: Normal mood and affect. speech and behavior is normal. Judgment and thought content normal. Cognition and memory are normal.   Labs on Admission:  Results for orders placed during the hospital encounter of 01/19/11 (from the past 48 hour(s))  CARDIAC PANEL(CRET KIN+CKTOT+MB+TROPI)     Status: Normal   Collection Time   01/19/11  4:31 PM      Component Value Range Comment   Total CK 44  7 - 177 (U/L)    CK, MB 2.5  0.3 - 4.0 (ng/mL)    Troponin I <0.30  <0.30 (ng/mL)    Relative Index RELATIVE INDEX IS INVALID  0.0 - 2.5    BASIC METABOLIC PANEL     Status: Abnormal    Collection Time   01/19/11  4:31 PM      Component Value Range Comment   Sodium 139  135 - 145 (mEq/L)    Potassium 3.8  3.5 - 5.1 (mEq/L)    Chloride 104  96 - 112 (mEq/L)    CO2 25  19 - 32 (mEq/L)    Glucose, Bld 107 (*) 70 - 99 (mg/dL)    BUN 20  6 - 23 (mg/dL)    Creatinine, Ser 1.61  0.50 - 1.10 (mg/dL)    Calcium 9.0  8.4 - 10.5 (mg/dL)    GFR calc non Af Amer 77 (*) >90 (mL/min)    GFR calc Af Amer 90 (*) >90 (mL/min)   CBC     Status: Normal   Collection Time   01/19/11  4:31 PM      Component Value Range Comment   WBC 6.7  4.0 - 10.5 (K/uL)    RBC 4.08  3.87 - 5.11 (MIL/uL)    Hemoglobin 12.1  12.0 - 15.0 (g/dL)    HCT 09.6  04.5 - 40.9 (%)  MCV 90.2  78.0 - 100.0 (fL)    MCH 29.7  26.0 - 34.0 (pg)    MCHC 32.9  30.0 - 36.0 (g/dL)    RDW 96.0  45.4 - 09.8 (%)    Platelets 166  150 - 400 (K/uL)   URINALYSIS, ROUTINE W REFLEX MICROSCOPIC     Status: Abnormal   Collection Time   01/19/11  5:07 PM      Component Value Range Comment   Color, Urine YELLOW  YELLOW     APPearance CLEAR  CLEAR     Specific Gravity, Urine 1.023  1.005 - 1.030     pH 6.0  5.0 - 8.0     Glucose, UA NEGATIVE  NEGATIVE (mg/dL)    Hgb urine dipstick NEGATIVE  NEGATIVE     Bilirubin Urine SMALL (*) NEGATIVE     Ketones, ur NEGATIVE  NEGATIVE (mg/dL)    Protein, ur NEGATIVE  NEGATIVE (mg/dL)    Urobilinogen, UA 0.2  0.0 - 1.0 (mg/dL)    Nitrite NEGATIVE  NEGATIVE     Leukocytes, UA SMALL (*) NEGATIVE    URINE MICROSCOPIC-ADD ON     Status: Abnormal   Collection Time   01/19/11  5:07 PM      Component Value Range Comment   Squamous Epithelial / LPF RARE  RARE     WBC, UA 3-6  <3 (WBC/hpf)    RBC / HPF 0-2  <3 (RBC/hpf)    Bacteria, UA FEW (*) RARE     Casts HYALINE CASTS (*) NEGATIVE    LACTIC ACID, PLASMA     Status: Normal   Collection Time   01/19/11  8:14 PM      Component Value Range Comment   Lactic Acid, Venous 0.7  0.5 - 2.2 (mmol/L)     Radiological Exams on  Admission: 01/19/2011 - CXR: no acute cardiopulmonary processes noted  Assessment/Plan  Principal Problem:  *Hypotension - Unclear etiology of patient's hypotension on admission but her dizziness and fatigue are likely related to hypotension - Etiology includes dehydration, possibly infectious etiology even though unlikely given no fevers, no leukocytosis and no clinical symptoms suggestive of infection - Urinalysis significant only for small leukocytes and I'm not sure that this exactly needs to be treated since patient denies any specific urinary symptoms but she has already received dose of Rocephin at [facility] - She was admitted to step down unit - Will treat symptomatically with IV fluids, will obtain CT of the head without contrast - Check for orthostatic vitals, and followup on CBC, CMP, cardiac enzymes, 12-lead EKG, TSH  Active Problems:  UTI (lower urinary tract infection) - Given the absence of symptoms of urinary tract infection I'm not sure that this needs to be treated but will obtain urine culture - Will treat empirically with Rocephin for now   Bradycardia - Currently resolved - Will monitor on telemetry and obtain cardiac enzymes as well as 12-lead EKG - Check TSH   Disposition - Diagnosis, plan discussed with patient as well as blood work analysis - Patient has verbalized understanding and agrees with plan  Time Spent on Admission: Over 30 minutes  Lindsey Rose 01/19/2011, 11:29 PM

## 2011-01-19 NOTE — ED Notes (Signed)
Pt. Reports she feels tired and is in no distress at present time.  Pt. Reports no pain at this time.

## 2011-01-19 NOTE — ED Notes (Signed)
Pt. Has had bilat. Mastectomy with Node removal on the L.  B/P cuff is on the R lower limb due to bruise on the R upper arm at sited B/P was being checked.

## 2011-01-19 NOTE — ED Provider Notes (Signed)
History     CSN: [id number] Arrival date & time: 01/19/2011  4:16 PM   First MD Initiated Contact with Patient 01/19/11 1617      Chief Complaint  Patient presents with  . Hypotension   patient reported feeling dizzy and fatigued. This morning upon awakening. She says that she actually went back to bed 2 hours after she woke up. Subsequent to feeling fatigued. She was apparently in her normal state of health yesterday. Throughout the day. She had increasing pain in her left shoulder, which can be normal for her as she has chronic shoulder pain and problems. She denied any chest pain, however, denied any palpitations. Denies any other pains in her body. She has had minimal shortness of breath, but denies any pleuritic pain. She's had no recent fevers or illness. No new medications. No recent episodes of nausea, vomiting, or diarrhea. She denies any focal motor deficits. Denies any headaches. She states her blood pressure at home was approximately 80/40, and she states normally runs 110/60. She had no syncope. Denies any recent GI bleeding. Denies the use of any alcohol or illicit drugs.  (Consider location/radiation/quality/duration/timing/severity/associated sxs/prior treatment) HPI  Past Medical History  Diagnosis Date  . Cancer   . Breast cancer   . Arthritis   . Disc degeneration   . Anxiety     Past Surgical History  Procedure Date  . Breast surgery   . Abdominal hysterectomy   . Cholecystectomy   . Joint replacement     No family history on file.  History  Substance Use Topics  . Smoking status: Current Everyday Smoker  . Smokeless tobacco: Not on file  . Alcohol Use: Yes    OB History    Grav Para Term Preterm Abortions TAB SAB Ect Mult Living                  Review of Systems  All other systems reviewed and are negative.    Allergies  Ivp dye and Aleve  Home Medications   Current Outpatient Rx  Name Route Sig Dispense Refill  . ALBUTEROL SULFATE HFA  108 (90 BASE) MCG/ACT IN AERS Inhalation Inhale 2 puffs into the lungs every 6 (six) hours as needed. For shortness of breath and wheezing     . CELECOXIB 100 MG PO CAPS Oral Take 100 mg by mouth 2 (two) times daily.      Marland Kitchen FLUOXETINE HCL 20 MG PO CAPS Oral Take 20 mg by mouth daily.      Marland Kitchen FLUTICASONE PROPIONATE 50 MCG/ACT NA SUSP Each Nare Place 1 spray into both nostrils daily.      Marland Kitchen ONE-DAILY MULTI VITAMINS PO TABS Oral Take 1 tablet by mouth daily.      . OXYCODONE HCL ER 30 MG PO TB12 Oral Take 30 mg by mouth 3 (three) times daily.      . OXYCODONE HCL 15 MG PO TABS Oral Take 15 mg by mouth 6 (six) times daily.      . TROLAMINE SALICYLATE 10 % EX LOTN Apply externally Apply topically as needed. For pain      . VITAMIN D (CHOLECALCIFEROL) PO Oral Take 2,500 mg by mouth daily.        BP 97/57  Pulse 52  Temp(Src) 98 F (36.7 C) (Oral)  Resp 18  Ht 5' 2.5" (1.588 m)  Wt 140 lb (63.504 kg)  BMI 25.20 kg/m2  SpO2 100%  Physical Exam  Nursing note and vitals reviewed.  Constitutional: She is oriented to person, place, and time. She appears well-developed and well-nourished. No distress.  HENT:  Head: Normocephalic and atraumatic.  Eyes: Conjunctivae and EOM are normal. Pupils are equal, round, and reactive to light.  Neck: Neck supple.  Cardiovascular: Normal rate and regular rhythm.  Exam reveals no gallop and no friction rub.   No murmur heard. Pulmonary/Chest: Breath sounds normal. She has no wheezes. She has no rales. She exhibits no tenderness.  Abdominal: Soft. Bowel sounds are normal. She exhibits no distension. There is no tenderness. There is no rebound and no guarding.  Musculoskeletal: Normal range of motion. She exhibits no edema and no tenderness.  Neurological: She is alert and oriented to person, place, and time. She has normal reflexes. No cranial nerve deficit. She exhibits normal muscle tone. Coordination normal.  Skin: Skin is warm and dry. No rash noted.    Psychiatric: She has a normal mood and affect.    ED Course  Procedures (including critical care time)  Labs Reviewed  BASIC METABOLIC PANEL - Abnormal; Notable for the following:    Glucose, Bld 107 (*)    GFR calc non Af Amer 77 (*)    GFR calc Af Amer 90 (*)    All other components within normal limits  URINALYSIS, ROUTINE W REFLEX MICROSCOPIC - Abnormal; Notable for the following:    Bilirubin Urine SMALL (*)    Leukocytes, UA SMALL (*)    All other components within normal limits  URINE MICROSCOPIC-ADD ON - Abnormal; Notable for the following:    Bacteria, UA FEW (*)    Casts HYALINE CASTS (*)    All other components within normal limits  CARDIAC PANEL(CRET KIN+CKTOT+MB+TROPI)  CBC   Dg Chest 2 View  01/19/2011  *RADIOLOGY REPORT*  Clinical Data: Weakness and lightheadedness.  Hypotension.  History of breast cancer, hysterectomy and cholecystectomy.  CHEST - 2 VIEW  Comparison: 11/12/2009 radiographs and 08/24/2009 CT.  Findings: The heart size and mediastinal contours are stable.  The lungs are stable with mild diffuse central airway thickening. There is no hyperinflation, confluent airspace opacity or pleural effusion.  Postsurgical changes within the left proximal humerus are stable.  There are no acute osseous findings.  Multiple telemetry leads overlie the chest.  IMPRESSION: Stable mild chronic central airway thickening.  No acute cardiopulmonary process.  Original Report Authenticated By: Gerrianne Scale, M.D.     No diagnosis found.    MDM  Pt is seen and examined;  Initial history and physical completed.  Will follow.    Date: 01/19/2011  Rate: 53   Rhythm: sinus bradycardia  QRS Axis: normal  Intervals: normal  ST/T Wave abnormalities: normal  Conduction Disutrbances:prolonged QT  Narrative Interpretation:   Old EKG Reviewed: changes noted   Results for orders placed during the hospital encounter of 01/19/11  CARDIAC PANEL(CRET KIN+CKTOT+MB+TROPI)       Component Value Range   Total CK 44  7 - 177 (U/L)   CK, MB 2.5  0.3 - 4.0 (ng/mL)   Troponin I <0.30  <0.30 (ng/mL)   Relative Index RELATIVE INDEX IS INVALID  0.0 - 2.5   BASIC METABOLIC PANEL      Component Value Range   Sodium 139  135 - 145 (mEq/L)   Potassium 3.8  3.5 - 5.1 (mEq/L)   Chloride 104  96 - 112 (mEq/L)   CO2 25  19 - 32 (mEq/L)   Glucose, Bld 107 (*) 70 - 99 (mg/dL)  BUN 20  6 - 23 (mg/dL)   Creatinine, Ser 4.09  0.50 - 1.10 (mg/dL)   Calcium 9.0  8.4 - 81.1 (mg/dL)   GFR calc non Af Amer 77 (*) >90 (mL/min)   GFR calc Af Amer 90 (*) >90 (mL/min)  URINALYSIS, ROUTINE W REFLEX MICROSCOPIC      Component Value Range   Color, Urine YELLOW  YELLOW    APPearance CLEAR  CLEAR    Specific Gravity, Urine 1.023  1.005 - 1.030    pH 6.0  5.0 - 8.0    Glucose, UA NEGATIVE  NEGATIVE (mg/dL)   Hgb urine dipstick NEGATIVE  NEGATIVE    Bilirubin Urine SMALL (*) NEGATIVE    Ketones, ur NEGATIVE  NEGATIVE (mg/dL)   Protein, ur NEGATIVE  NEGATIVE (mg/dL)   Urobilinogen, UA 0.2  0.0 - 1.0 (mg/dL)   Nitrite NEGATIVE  NEGATIVE    Leukocytes, UA SMALL (*) NEGATIVE   CBC      Component Value Range   WBC 6.7  4.0 - 10.5 (K/uL)   RBC 4.08  3.87 - 5.11 (MIL/uL)   Hemoglobin 12.1  12.0 - 15.0 (g/dL)   HCT 91.4  78.2 - 95.6 (%)   MCV 90.2  78.0 - 100.0 (fL)   MCH 29.7  26.0 - 34.0 (pg)   MCHC 32.9  30.0 - 36.0 (g/dL)   RDW 21.3  08.6 - 57.8 (%)   Platelets 166  150 - 400 (K/uL)  URINE MICROSCOPIC-ADD ON      Component Value Range   Squamous Epithelial / LPF RARE  RARE    WBC, UA 3-6  <3 (WBC/hpf)   RBC / HPF 0-2  <3 (RBC/hpf)   Bacteria, UA FEW (*) RARE    Casts HYALINE CASTS (*) NEGATIVE    Dg Chest 2 View  01/19/2011  *RADIOLOGY REPORT*  Clinical Data: Weakness and lightheadedness.  Hypotension.  History of breast cancer, hysterectomy and cholecystectomy.  CHEST - 2 VIEW  Comparison: 11/12/2009 radiographs and 08/24/2009 CT.  Findings: The heart size and mediastinal  contours are stable.  The lungs are stable with mild diffuse central airway thickening. There is no hyperinflation, confluent airspace opacity or pleural effusion.  Postsurgical changes within the left proximal humerus are stable.  There are no acute osseous findings.  Multiple telemetry leads overlie the chest.  IMPRESSION: Stable mild chronic central airway thickening.  No acute cardiopulmonary process.  Original Report Authenticated By: Gerrianne Scale, M.D.    6:32 PM  Feels better, persistent low BP;  [facility] paged for further admission.   7:04 PM Discussed the [facility] hospitalist. They're requesting blood cultures, vancomycin and Zosyn to cover for possible early sepsis. They're accepting the patient to the step down unit, unit at [facility]. She remains stable at this time             Theron Arista A. Patrica Duel, MD 01/19/11 [id number]

## 2011-01-19 NOTE — ED Notes (Signed)
Pt sts BP low at home and c/o "feeling lightheaded and weak today". Pt BP 85/45 in triage.

## 2011-01-20 ENCOUNTER — Other Ambulatory Visit: Payer: Self-pay

## 2011-01-20 ENCOUNTER — Inpatient Hospital Stay (HOSPITAL_COMMUNITY): Payer: Medicare Other

## 2011-01-20 DIAGNOSIS — I951 Orthostatic hypotension: Secondary | ICD-10-CM | POA: Diagnosis present

## 2011-01-20 DIAGNOSIS — G8929 Other chronic pain: Secondary | ICD-10-CM | POA: Diagnosis present

## 2011-01-20 DIAGNOSIS — M5136 Other intervertebral disc degeneration, lumbar region: Secondary | ICD-10-CM | POA: Diagnosis present

## 2011-01-20 DIAGNOSIS — Z853 Personal history of malignant neoplasm of breast: Secondary | ICD-10-CM

## 2011-01-20 DIAGNOSIS — F419 Anxiety disorder, unspecified: Secondary | ICD-10-CM | POA: Diagnosis present

## 2011-01-20 DIAGNOSIS — M545 Low back pain, unspecified: Secondary | ICD-10-CM | POA: Diagnosis present

## 2011-01-20 DIAGNOSIS — E860 Dehydration: Secondary | ICD-10-CM | POA: Diagnosis present

## 2011-01-20 DIAGNOSIS — F32A Depression, unspecified: Secondary | ICD-10-CM | POA: Diagnosis present

## 2011-01-20 LAB — COMPREHENSIVE METABOLIC PANEL
ALT: 11 U/L (ref 0–35)
AST: 17 U/L (ref 0–37)
Albumin: 3.1 g/dL — ABNORMAL LOW (ref 3.5–5.2)
Alkaline Phosphatase: 89 U/L (ref 39–117)
Potassium: 4.1 mEq/L (ref 3.5–5.1)
Sodium: 138 mEq/L (ref 135–145)
Total Bilirubin: 0.3 mg/dL (ref 0.3–1.2)
Total Protein: 6.1 g/dL (ref 6.0–8.3)

## 2011-01-20 LAB — APTT: aPTT: 32 seconds (ref 24–37)

## 2011-01-20 LAB — CARDIAC PANEL(CRET KIN+CKTOT+MB+TROPI)
CK, MB: 2 ng/mL (ref 0.3–4.0)
CK, MB: 2.1 ng/mL (ref 0.3–4.0)
CK, MB: 2 ng/mL (ref 0.3–4.0)
Relative Index: INVALID (ref 0.0–2.5)
Total CK: 34 U/L (ref 7–177)
Total CK: 30 U/L (ref 7–177)
Troponin I: <0.3 ng/mL (ref <0.30)
Troponin I: <0.3 ng/mL (ref <0.30)

## 2011-01-20 LAB — PROTIME-INR
INR: 1.06 (ref 0.00–1.49)
Prothrombin Time: 14 seconds (ref 11.6–15.2)

## 2011-01-20 LAB — BASIC METABOLIC PANEL
BUN: 13 mg/dL (ref 6–23)
CO2: 24 mEq/L (ref 19–32)
Calcium: 8.4 mg/dL (ref 8.4–10.5)
Chloride: 109 mEq/L (ref 96–112)
Glucose, Bld: 87 mg/dL (ref 70–99)
Potassium: 3.8 mEq/L (ref 3.5–5.1)
Sodium: 142 mEq/L (ref 135–145)

## 2011-01-20 LAB — MRSA PCR SCREENING: MRSA by PCR: NEGATIVE

## 2011-01-20 LAB — CBC
HCT: 36.9 % (ref 36.0–46.0)
Hemoglobin: 12 g/dL (ref 12.0–15.0)
MCH: 29.6 pg (ref 26.0–34.0)
MCV: 90.9 fL (ref 78.0–100.0)
Platelets: 146 10*3/uL — ABNORMAL LOW (ref 150–400)
RBC: 4.06 MIL/uL (ref 3.87–5.11)

## 2011-01-20 LAB — HEMOGLOBIN A1C
Hgb A1c MFr Bld: 5.7 % — ABNORMAL HIGH (ref <5.7)
Mean Plasma Glucose: 117 mg/dL — ABNORMAL HIGH (ref <117)

## 2011-01-20 MED ORDER — POLYETHYLENE GLYCOL 3350 17 G PO PACK
17.0000 g | PACK | Freq: Every day | ORAL | Status: DC | PRN
  Filled 2011-01-20: qty 1

## 2011-01-20 MED ORDER — POLYETHYLENE GLYCOL 3350 17 G PO PACK
17.0000 g | PACK | Freq: Every day | ORAL | Status: DC
  Administered 2011-01-20: 17 g via ORAL
  Filled 2011-01-20 (×2): qty 1

## 2011-01-20 MED ORDER — PNEUMOCOCCAL VAC POLYVALENT 25 MCG/0.5ML IJ INJ
0.5000 mL | INJECTION | INTRAMUSCULAR | Status: DC
  Filled 2011-01-20: qty 0.5

## 2011-01-20 MED ORDER — OXYCODONE HCL 5 MG PO TABS
15.0000 mg | ORAL_TABLET | Freq: Four times a day (QID) | ORAL | Status: DC | PRN
  Administered 2011-01-20 – 2011-01-21 (×3): 15 mg via ORAL
  Filled 2011-01-20 (×4): qty 3

## 2011-01-20 MED ORDER — OXYCODONE HCL 10 MG PO TB12: 30.0000 mg | ORAL_TABLET | Freq: Three times a day (TID) | ORAL | Status: DC

## 2011-01-20 MED ORDER — PNEUMOCOCCAL 13-VAL CONJ VACC IM SUSP: 0.5000 mL | INTRAMUSCULAR | Status: DC

## 2011-01-20 MED ORDER — DOCUSATE SODIUM 100 MG PO CAPS
100.0000 mg | ORAL_CAPSULE | Freq: Two times a day (BID) | ORAL | Status: DC
  Administered 2011-01-20 – 2011-01-21 (×3): 100 mg via ORAL
  Filled 2011-01-20 (×4): qty 1

## 2011-01-20 MED ORDER — OXYCODONE HCL 5 MG PO TABS: 15.0000 mg | ORAL_TABLET | Freq: Four times a day (QID) | ORAL | Status: DC | PRN

## 2011-01-20 MED ORDER — OXYCODONE HCL 10 MG PO TB12
30.0000 mg | ORAL_TABLET | Freq: Three times a day (TID) | ORAL | Status: DC
  Administered 2011-01-20 – 2011-01-21 (×2): 30 mg via ORAL
  Filled 2011-01-20 (×2): qty 3

## 2011-01-20 NOTE — Progress Notes (Signed)
D/C from PT before the evaluation due to pt being back at baseline and Independent with mobility.  01/20/2011  Corcoran Bing, PT [phone] (pager)

## 2011-01-20 NOTE — Progress Notes (Signed)
Subjective: Awake. Denies current dizziness, chest pain or SOB. Did feel she is constipated but states was on Miralax in the past but stopped "because it made have a bowel movement every day" Endorses has lost about 5 ponds over the past month but also states has had poor appetite. Denies change in bowel patterns or any blood in stools. States had normal colonoscopy about 1 year ago.  Objective: Vital signs in last 24 hours: Temp:  [97.5 F (36.4 C)-98 F (36.7 C)] 97.9 F (36.6 C) (12/06 1143) Pulse Rate:  [47-71] 58  (12/06 1143) Resp:  [7-20] 16  (12/06 1143) BP: (85-156)/(45-68) 122/56 mmHg (12/06 1143) SpO2:  [95 %-100 %] 99 % (12/06 1143) Weight:  [62 kg (136 lb 11 oz)-63.504 kg (140 lb)] 136 lb 11 oz (62 kg) (12/05 2334) Weight change:     Intake/Output from previous day: 12/05 0701 - 12/06 0700 In: 2292.5 [P.O.:180; I.V.:2000; IV Piggyback:112.5] Out: 1830 [Urine:1830] Intake/Output this shift: Total I/O In: 240 [P.O.:240] Out: 600 [Urine:600]  General appearance: alert, cooperative, appears stated age and no distress Resp: clear to auscultation bilaterally Cardio: regular rate and rhythm, S1, S2 normal, no murmur, click, rub or gallop; IN fluids at 125 cc/hr GI: soft, non-tender; bowel sounds normal; no masses,  no organomegaly Extremities: extremities normal, atraumatic, no cyanosis or edema Neurologic: Grossly normal  Lab Results:  Basename 01/20/11 0425 01/19/11 1631  WBC 6.1 6.7  HGB 12.0 12.1  HCT 36.9 36.8  PLT 146* 166   BMET  Basename 01/20/11 0425 01/19/11 2346  NA 142 138  K 3.8 4.1  CL 109 108  CO2 24 20  GLUCOSE 87 91  BUN 13 15  CREATININE 0.76 0.70  CALCIUM 8.4 8.4    Studies/Results: Dg Chest 2 View  01/19/2011  *RADIOLOGY REPORT*  Clinical Data: Weakness and lightheadedness.  Hypotension.  History of breast cancer, hysterectomy and cholecystectomy.  CHEST - 2 VIEW  Comparison: 11/12/2009 radiographs and 08/24/2009 CT.  Findings: The  heart size and mediastinal contours are stable.  The lungs are stable with mild diffuse central airway thickening. There is no hyperinflation, confluent airspace opacity or pleural effusion.  Postsurgical changes within the left proximal humerus are stable.  There are no acute osseous findings.  Multiple telemetry leads overlie the chest.  IMPRESSION: Stable mild chronic central airway thickening.  No acute cardiopulmonary process.  Original Report Authenticated By: Gerrianne Scale, M.D.   Ct Head Wo Contrast  01/20/2011  *RADIOLOGY REPORT*  Clinical Data: Hypotension; near-syncope.  Dizziness.  CT HEAD WITHOUT CONTRAST  Technique:  Contiguous axial images were obtained from the base of the skull through the vertex without contrast.  Comparison: None.  Findings: There is no evidence of acute infarction, mass lesion, or intra- or extra-axial hemorrhage on CT.  Prominence of the sulci suggests mild cortical volume loss.  The posterior fossa, including the cerebellum, brainstem and fourth ventricle, is within normal limits.  The third and lateral ventricles, and basal ganglia are unremarkable in appearance.  The cerebral hemispheres are symmetric in appearance, with normal gray- white differentiation.  No mass effect or midline shift is seen.  There is no evidence of fracture; visualized osseous structures are unremarkable in appearance.  The visualized portions of the orbits are within normal limits.  The paranasal sinuses and mastoid air cells are well-aerated.  No significant soft tissue abnormalities are seen.  IMPRESSION:  1.  No acute intracranial pathology seen on CT. 2.  Mild cortical volume  loss noted.  Original Report Authenticated By: Tonia Ghent, M.D.    Medications: I have reviewed the patient's current medications.  Assessment/Plan:  Principal Problem:  *Hypotension/ Orthostasis/Dehydration Now normotensive after restoration of fluid volume. Suspect poor intake pre admit in setting of  chronic narcotics is contributing factor. Suspect she may be constipated as well. Will repeat OVS at least once. Will continue IV fluids for now.  Active Problems:  UTI (lower urinary tract infection) UA borderline- follow up on urine culture. Suspect UA abnormalities due to dehydration   Bradycardia Not on offending meds pre admit and currently is normotensive insetting of HR low 50's and high 40's. TSH is normal. 2D ECHO pending. Cardiac panel negative x 3.   Chronic lumbar pain/ Degenerative disc disease, lumbar Will continue home meds. RN did call and the patient took a dose of her long-acting Oxycontin which she had kept in her room. The patient reported she did not realize that the Oxycontin the RN gave her earlier that am was also the long acting form. Subsequently the home meds have been sent home with the husband. Due to concerns of possible constipation from these meds we will add stool softners and PRN Miralax.   Anxiety and depression Stable at this time   History of breast cancer in female Treated in 2001   Disposition Due to extra narcotics she will remain in SDU additional 24 hours for observation.   LOS: 1 day   Junious Silk, ANP pager [phone] 01/20/2011, 2:16 PM

## 2011-01-20 NOTE — Progress Notes (Signed)
  Echocardiogram 2D Echocardiogram has been performed.  Lindsey Rose 01/20/2011, 10:36 AM

## 2011-01-20 NOTE — Progress Notes (Signed)
I have examined the patient and reviewed the chart. The patient's nurse has contacted me and mentioned that although she is due for her regularly scheduled Oxycodone, she is not having much pain. This may be due to the fact that she had a double dose of Oxycontin this AM. I have changed her Oxycodone to PRN for now.

## 2011-01-21 LAB — BASIC METABOLIC PANEL
CO2: 23 mEq/L (ref 19–32)
Calcium: 8.2 mg/dL — ABNORMAL LOW (ref 8.4–10.5)
Chloride: 107 mEq/L (ref 96–112)
Glucose, Bld: 87 mg/dL (ref 70–99)
Potassium: 3.8 mEq/L (ref 3.5–5.1)
Sodium: 139 mEq/L (ref 135–145)

## 2011-01-21 LAB — URINE CULTURE
Culture  Setup Time: 201212060502
Culture: NO GROWTH

## 2011-01-21 LAB — CBC
HCT: 34.4 % — ABNORMAL LOW (ref 36.0–46.0)
Hemoglobin: 11.2 g/dL — ABNORMAL LOW (ref 12.0–15.0)
Platelets: 146 10*3/uL — ABNORMAL LOW (ref 150–400)
RBC: 3.83 MIL/uL — ABNORMAL LOW (ref 3.87–5.11)
WBC: 6.3 10*3/uL (ref 4.0–10.5)

## 2011-01-21 MED ORDER — POLYETHYLENE GLYCOL 3350 17 G PO PACK: 17.0000 g | PACK | Freq: Every day | ORAL | Status: AC

## 2011-01-21 MED ORDER — DSS 100 MG PO CAPS: 100.0000 mg | ORAL_CAPSULE | Freq: Two times a day (BID) | ORAL | Status: AC

## 2011-01-21 NOTE — Discharge Summary (Signed)
DISCHARGE SUMMARY  Lindsey Rose  MR#: [id number]  DOB:03/14/1950  Date of Admission: 01/19/2011 Date of Discharge: 01/21/2011  Attending Physician:Saima Rizwan  Patient's ION:GEXBMW,UXLKGM, MD, MD  Consults: None  Discharge Diagnoses: Principal Problem:  *Hypotension Active Problems:  UTI (lower urinary tract infection)  Bradycardia  Orthostasis  Dehydration  Chronic lumbar pain  Degenerative disc disease, lumbar  Anxiety and depression  History of breast cancer in female   Radiology: Dg Chest 2 View  01/19/2011  *RADIOLOGY REPORT*  Clinical Data: Weakness and lightheadedness.  Hypotension.  History of breast cancer, hysterectomy and cholecystectomy.  CHEST - 2 VIEW  Comparison: 11/12/2009 radiographs and 08/24/2009 CT.  Findings: The heart size and mediastinal contours are stable.  The lungs are stable with mild diffuse central airway thickening. There is no hyperinflation, confluent airspace opacity or pleural effusion.  Postsurgical changes within the left proximal humerus are stable.  There are no acute osseous findings.  Multiple telemetry leads overlie the chest.  IMPRESSION: Stable mild chronic central airway thickening.  No acute cardiopulmonary process.  Original Report Authenticated By: Gerrianne Scale, M.D.   Ct Head Wo Contrast  01/20/2011  *RADIOLOGY REPORT*  Clinical Data: Hypotension; near-syncope.  Dizziness.  CT HEAD WITHOUT CONTRAST  Technique:  Contiguous axial images were obtained from the base of the skull through the vertex without contrast.  Comparison: None.  Findings: There is no evidence of acute infarction, mass lesion, or intra- or extra-axial hemorrhage on CT.  Prominence of the sulci suggests mild cortical volume loss.  The posterior fossa, including the cerebellum, brainstem and fourth ventricle, is within normal limits.  The third and lateral ventricles, and basal ganglia are unremarkable in appearance.  The cerebral hemispheres are symmetric in  appearance, with normal gray- white differentiation.  No mass effect or midline shift is seen.  There is no evidence of fracture; visualized osseous structures are unremarkable in appearance.  The visualized portions of the orbits are within normal limits.  The paranasal sinuses and mastoid air cells are well-aerated.  No significant soft tissue abnormalities are seen.  IMPRESSION:  1.  No acute intracranial pathology seen on CT. 2.  Mild cortical volume loss noted.  Original Report Authenticated By: Tonia Ghent, M.D.    Laboratory: Results for orders placed during the hospital encounter of 01/19/11 (from the past 48 hour(s))  CARDIAC PANEL(CRET KIN+CKTOT+MB+TROPI)     Status: Normal   Collection Time   01/19/11  4:31 PM      Component Value Range Comment   Total CK 44  7 - 177 (U/L)    CK, MB 2.5  0.3 - 4.0 (ng/mL)    Troponin I <0.30  <0.30 (ng/mL)    Relative Index RELATIVE INDEX IS INVALID  0.0 - 2.5    BASIC METABOLIC PANEL     Status: Abnormal   Collection Time   01/19/11  4:31 PM      Component Value Range Comment   Sodium 139  135 - 145 (mEq/L)    Potassium 3.8  3.5 - 5.1 (mEq/L)    Chloride 104  96 - 112 (mEq/L)    CO2 25  19 - 32 (mEq/L)    Glucose, Bld 107 (*) 70 - 99 (mg/dL)    BUN 20  6 - 23 (mg/dL)    Creatinine, Ser 0.10  0.50 - 1.10 (mg/dL)    Calcium 9.0  8.4 - 10.5 (mg/dL)    GFR calc non Af Amer 77 (*) >90 (mL/min)  GFR calc Af Amer 90 (*) >90 (mL/min)   CBC     Status: Normal   Collection Time   01/19/11  4:31 PM      Component Value Range Comment   WBC 6.7  4.0 - 10.5 (K/uL)    RBC 4.08  3.87 - 5.11 (MIL/uL)    Hemoglobin 12.1  12.0 - 15.0 (g/dL)    HCT 16.1  09.6 - 04.5 (%)    MCV 90.2  78.0 - 100.0 (fL)    MCH 29.7  26.0 - 34.0 (pg)    MCHC 32.9  30.0 - 36.0 (g/dL)    RDW 40.9  81.1 - 91.4 (%)    Platelets 166  150 - 400 (K/uL)   URINALYSIS, ROUTINE W REFLEX MICROSCOPIC     Status: Abnormal   Collection Time   01/19/11  5:07 PM      Component Value  Range Comment   Color, Urine YELLOW  YELLOW     APPearance CLEAR  CLEAR     Specific Gravity, Urine 1.023  1.005 - 1.030     pH 6.0  5.0 - 8.0     Glucose, UA NEGATIVE  NEGATIVE (mg/dL)    Hgb urine dipstick NEGATIVE  NEGATIVE     Bilirubin Urine SMALL (*) NEGATIVE     Ketones, ur NEGATIVE  NEGATIVE (mg/dL)    Protein, ur NEGATIVE  NEGATIVE (mg/dL)    Urobilinogen, UA 0.2  0.0 - 1.0 (mg/dL)    Nitrite NEGATIVE  NEGATIVE     Leukocytes, UA SMALL (*) NEGATIVE    URINE MICROSCOPIC-ADD ON     Status: Abnormal   Collection Time   01/19/11  5:07 PM      Component Value Range Comment   Squamous Epithelial / LPF RARE  RARE     WBC, UA 3-6  <3 (WBC/hpf)    RBC / HPF 0-2  <3 (RBC/hpf)    Bacteria, UA FEW (*) RARE     Casts HYALINE CASTS (*) NEGATIVE    LACTIC ACID, PLASMA     Status: Normal   Collection Time   01/19/11  8:14 PM      Component Value Range Comment   Lactic Acid, Venous 0.7  0.5 - 2.2 (mmol/L)   CULTURE, BLOOD (ROUTINE X 2)     Status: Normal (Preliminary result)   Collection Time   01/19/11  8:20 PM      Component Value Range Comment   Specimen Description BLOOD RIGHT WRIST IV SITE      Special Requests NONE BOTTLES DRAWN AEROBIC AND ANAEROBIC 5 ML      Setup Time [id number]      Culture        Value:        BLOOD CULTURE RECEIVED NO GROWTH TO DATE CULTURE WILL BE HELD FOR 5 DAYS BEFORE ISSUING A FINAL NEGATIVE REPORT   Report Status PENDING     MRSA PCR SCREENING     Status: Normal   Collection Time   01/19/11 11:24 PM      Component Value Range Comment   MRSA by PCR NEGATIVE  NEGATIVE    COMPREHENSIVE METABOLIC PANEL     Status: Abnormal   Collection Time   01/19/11 11:46 PM      Component Value Range Comment   Sodium 138  135 - 145 (mEq/L)    Potassium 4.1  3.5 - 5.1 (mEq/L)    Chloride 108  96 - 112 (mEq/L)  CO2 20  19 - 32 (mEq/L)    Glucose, Bld 91  70 - 99 (mg/dL)    BUN 15  6 - 23 (mg/dL)    Creatinine, Ser 4.09  0.50 - 1.10 (mg/dL)    Calcium 8.4  8.4  - 10.5 (mg/dL)    Total Protein 6.1  6.0 - 8.3 (g/dL)    Albumin 3.1 (*) 3.5 - 5.2 (g/dL)    AST 17  0 - 37 (U/L)    ALT 11  0 - 35 (U/L)    Alkaline Phosphatase 89  39 - 117 (U/L)    Total Bilirubin 0.3  0.3 - 1.2 (mg/dL)    GFR calc non Af Amer >90  >90 (mL/min)    GFR calc Af Amer >90  >90 (mL/min)   MAGNESIUM     Status: Normal   Collection Time   01/19/11 11:46 PM      Component Value Range Comment   Magnesium 2.2  1.5 - 2.5 (mg/dL)   PHOSPHORUS     Status: Normal   Collection Time   01/19/11 11:46 PM      Component Value Range Comment   Phosphorus 4.1  2.3 - 4.6 (mg/dL)   TSH     Status: Normal   Collection Time   01/19/11 11:46 PM      Component Value Range Comment   TSH 1.827  0.350 - 4.500 (uIU/mL)   APTT     Status: Normal   Collection Time   01/19/11 11:46 PM      Component Value Range Comment   aPTT 32  24 - 37 (seconds)   PROTIME-INR     Status: Normal   Collection Time   01/19/11 11:46 PM      Component Value Range Comment   Prothrombin Time 14.0  11.6 - 15.2 (seconds)    INR 1.06  0.00 - 1.49    CARDIAC PANEL(CRET KIN+CKTOT+MB+TROPI)     Status: Normal   Collection Time   01/19/11 11:46 PM      Component Value Range Comment   Total CK 34  7 - 177 (U/L)    CK, MB 2.0  0.3 - 4.0 (ng/mL)    Troponin I <0.30  <0.30 (ng/mL)    Relative Index RELATIVE INDEX IS INVALID  0.0 - 2.5    HEMOGLOBIN A1C     Status: Abnormal   Collection Time   01/19/11 11:46 PM      Component Value Range Comment   Hemoglobin A1C 5.7 (*) <5.7 (%)    Mean Plasma Glucose 117 (*) <117 (mg/dL)   BASIC METABOLIC PANEL     Status: Abnormal   Collection Time   01/20/11  4:25 AM      Component Value Range Comment   Sodium 142  135 - 145 (mEq/L)    Potassium 3.8  3.5 - 5.1 (mEq/L)    Chloride 109  96 - 112 (mEq/L)    CO2 24  19 - 32 (mEq/L)    Glucose, Bld 87  70 - 99 (mg/dL)    BUN 13  6 - 23 (mg/dL)    Creatinine, Ser 8.11  0.50 - 1.10 (mg/dL)    Calcium 8.4  8.4 - 10.5 (mg/dL)    GFR  calc non Af Amer 89 (*) >90 (mL/min)    GFR calc Af Amer >90  >90 (mL/min)   CBC     Status: Abnormal   Collection Time   01/20/11  4:25 AM  Component Value Range Comment   WBC 6.1  4.0 - 10.5 (K/uL)    RBC 4.06  3.87 - 5.11 (MIL/uL)    Hemoglobin 12.0  12.0 - 15.0 (g/dL)    HCT 08.6  57.8 - 46.9 (%)    MCV 90.9  78.0 - 100.0 (fL)    MCH 29.6  26.0 - 34.0 (pg)    MCHC 32.5  30.0 - 36.0 (g/dL)    RDW 62.9  52.8 - 41.3 (%)    Platelets 146 (*) 150 - 400 (K/uL)   PROTIME-INR     Status: Normal   Collection Time   01/20/11  4:25 AM      Component Value Range Comment   Prothrombin Time 13.7  11.6 - 15.2 (seconds)    INR 1.03  0.00 - 1.49    APTT     Status: Normal   Collection Time   01/20/11  4:25 AM      Component Value Range Comment   aPTT 33  24 - 37 (seconds)   URINE CULTURE     Status: Normal   Collection Time   01/20/11  4:30 AM      Component Value Range Comment   Specimen Description URINE, CLEAN CATCH      Special Requests NONE      Setup Time [id number]      Colony Count NO GROWTH      Culture NO GROWTH      Report Status 01/21/2011 FINAL     CARDIAC PANEL(CRET KIN+CKTOT+MB+TROPI)     Status: Normal   Collection Time   01/20/11  9:04 AM      Component Value Range Comment   Total CK 32  7 - 177 (U/L)    CK, MB 2.1  0.3 - 4.0 (ng/mL)    Troponin I <0.30  <0.30 (ng/mL)    Relative Index RELATIVE INDEX IS INVALID  0.0 - 2.5    CARDIAC PANEL(CRET KIN+CKTOT+MB+TROPI)     Status: Normal   Collection Time   01/20/11  3:32 PM      Component Value Range Comment   Total CK 30  7 - 177 (U/L)    CK, MB 2.0  0.3 - 4.0 (ng/mL)    Troponin I <0.30  <0.30 (ng/mL)    Relative Index RELATIVE INDEX IS INVALID  0.0 - 2.5    CBC     Status: Abnormal   Collection Time   01/21/11  4:20 AM      Component Value Range Comment   WBC 6.3  4.0 - 10.5 (K/uL)    RBC 3.83 (*) 3.87 - 5.11 (MIL/uL)    Hemoglobin 11.2 (*) 12.0 - 15.0 (g/dL)    HCT 64.4 (*) 03.4 - 46.0 (%)    MCV 89.8   78.0 - 100.0 (fL)    MCH 29.2  26.0 - 34.0 (pg)    MCHC 32.6  30.0 - 36.0 (g/dL)    RDW 74.2  59.5 - 63.8 (%)    Platelets 146 (*) 150 - 400 (K/uL)   BASIC METABOLIC PANEL     Status: Abnormal   Collection Time   01/21/11  4:20 AM      Component Value Range Comment   Sodium 139  135 - 145 (mEq/L)    Potassium 3.8  3.5 - 5.1 (mEq/L)    Chloride 107  96 - 112 (mEq/L)    CO2 23  19 - 32 (mEq/L)    Glucose, Bld 87  70 - 99 (mg/dL)    BUN 11  6 - 23 (mg/dL)    Creatinine, Ser 4.78  0.50 - 1.10 (mg/dL)    Calcium 8.2 (*) 8.4 - 10.5 (mg/dL)    GFR calc non Af Amer >90  >90 (mL/min)    GFR calc Af Amer >90  >90 (mL/min)      Current Discharge Medication List    START taking these medications   Details  docusate sodium 100 MG CAPS Take 100 mg by mouth 2 (two) times daily. Qty: 10 capsule    polyethylene glycol (MIRALAX / GLYCOLAX) packet Take 17 g by mouth daily. Qty: 14 each      CONTINUE these medications which have NOT CHANGED   Details  albuterol (PROVENTIL HFA;VENTOLIN HFA) 108 (90 BASE) MCG/ACT inhaler Inhale 2 puffs into the lungs every 6 (six) hours as needed. For shortness of breath and wheezing     celecoxib (CELEBREX) 100 MG capsule Take 100 mg by mouth 2 (two) times daily.      FLUoxetine (PROZAC) 20 MG capsule Take 20 mg by mouth daily.      fluticasone (FLONASE) 50 MCG/ACT nasal spray Place 1 spray into both nostrils daily.      Multiple Vitamin (MULTIVITAMIN) tablet Take 1 tablet by mouth daily.      oxycodone (OXYCONTIN) 30 MG TB12 Take 30 mg by mouth 3 (three) times daily.      oxyCODONE (ROXICODONE) 15 MG immediate release tablet Take 15 mg by mouth 6 (six) times daily.      Trolamine Salicylate (ASPERCREME) 10 % LOTN Apply topically as needed. For pain      VITAMIN D, CHOLECALCIFEROL, PO Take 2,500 mg by mouth daily.         History of Present Illness: 3 transfer from [facility] to [facility] for further evaluation of dizziness and  fatigue. Symptoms initially started on 01/18/2011 [facility] Emergency Department where she was found to be hypotensive BP reading 80/40. In addition she woke up feeling very tired with dizziness. She has chronic left shoulder pain which appears to be somewhat worse as well. She denied any chest pain, sensation of palpitations, shortness of breath. She denied any constitutional symptoms such as cough fevers or chills. She denied any abdominal pains or urinary symptoms. She had no focal weakness or numbness. She denied headaches visual changes syncopal episodes. Significant hospitalization sick contacts or recent exposures to illness. When she initially arrived to the emergency department her systolic blood pressure was 85 with a heart rate of 58. Is given fluid challenges with blood pressure upon transfer out of the emergency department being 153/52 with a heart rate of 55. She was in no acute distress. Exam completed at admission revealed no significant clinical findings other than the presentation with hypotension and bradycardia. Due to  symptomatic hypotension with bradycardia she was admitted to [facility] for further evaluation.  Hospital Course: Principal Problem:  *Hypotension/Orthostasis/Dehydration Her hypotension resolved slowly with restoration of fluid volume with a combination of normal saline  fluid challenges as well as continuous IV fluid hydration. In further discussion with the patient endorses sensation of feeling constipated. As noted she is on chronic narcotic pain medications and she does endorse that she had previously been on MiraLAX in the past but she stopped this because "it made me have a bowel movement every day". She endorsed non-volitional weight loss of 5 pounds over the past month but stated this was in correlation  with recent poor appetite. She denied any change in bowel patterns or any blood in her stools or dark stools. She endorse she had a normal colonoscopy about one  year prior. Orthostatic vital signs were ordered fortunately these were completed after she has been appropriately rehydrated and these were within normal limits. Based on the history given it is suspected that the primary etiology to the patient's symptomatic hypotension was underlying dehydration due to recent poor oral intake.  Active Problems:  UTI (lower urinary tract infection) Tthe initial urinalysis was negative. Urine culture pending at time of discharge. She had no leukocytosis of other or other evidence of acute infection or fevers. It is surmised that the abnormalites found on the urinalysis were due to dehydration.    Bradycardia   She was initially bradycardic with heart rates as low as the mid 40s at presentation. Given the fact that she was also hypotensive it was unclear as to as well as a symptomatic bradycardia he had other etiologies separated from the low blood pressure. Because of enzymes were cycled x4 collections and were negative. Echocardiogram was completed but results are not available at time of discharge. She has not had any cardiac symptoms since admission such as chest pain, shortness of breath or sensation of tachypalpitations. Noted that with restoration of a normotensive blood pressure her bradycardia persisted with rates in the high 50s over the past 12 hours making it less likely that she was experiencing symptomatic bradycardia. Morning of discharge heart rate was 63 with a blood pressure of 152/76.    Chronic lumbar pain/ Degenerative disc disease, lumbar  She takes a combination of long-acting and short-acting narcotics at home. As previously mentioned we feel that the primary etiology to her dehydration was related to poor by mouth intake. We feel this is a combination of constipation from narcotics as well as an expected appetite suppressant effect of the narcotic medication. Because she has significant pain she should continue her current medication. We have added  stool softeners and laxative agents to her usual medication regimen to prevent constipation in the future. She has been instructed on a taking these medications as prescribed.   Anxiety and depression  This problem has remained stable this admission and she will resume preadmission medications.    History of breast cancer in female No acute issues regarding this problem during this hospitalization.   Day of Discharge BP 152/76  Pulse 63  Temp(Src) 97.8 F (36.6 C) (Oral)  Resp 15  Ht 5\' 2"  (1.575 m)  Wt 69.7 kg (153 lb 10.6 oz)  BMI 28.10 kg/m2  SpO2 97%  Physical Exam:  General appearance: alert, cooperative, appears stated age and no distress Resp: clear to auscultation bilaterally Cardio: regular rate and rhythm, S1, S2 normal, no murmur, click, rub or gallop GI: soft, non-tender; bowel sounds normal; no masses,  no organomegaly Neurologic: Grossly normal  Follow-up:  Please call Dr. Ilsa Iha to be seen in one to 2 weeks for routine hospital followup   Disposition: She will discharge to home in accompaniment of her husband with no need to anticipated at this time   Junious Silk, ANP pager [phone]

## 2011-01-21 NOTE — Progress Notes (Signed)
   CARE MANAGEMENT NOTE 01/21/2011  Patient:  Lindsey Rose, Lindsey Rose   Account Number:  [id number]  Date Initiated:  01/21/2011  Documentation initiated by:  Onnie Boer  Subjective/Objective Assessment:   PT WAS ADMITTED WITH HYPOTENSION AND WEAKNESS     Action/Plan:   PROGRESSION OF CARE AND DISCHARGE PLANNING   Anticipated DC Date:  01/21/2011   Anticipated DC Plan:  HOME/SELF CARE      DC Planning Services  CM consult      Choice offered to / List presented to:             Status of service:  Completed, signed off Medicare Important Message given?   (If response is "NO", the following Medicare IM given date fields will be blank) Date Medicare IM given:   Date Additional Medicare IM given:    Discharge Disposition:  HOME/SELF CARE  Per UR Regulation:  Reviewed for med. necessity/level of care/duration of stay  Comments:  UR COMPLETED 01/21/2011 Onnie Boer, RN, BSN 1049 PT WILL DC TO HOME WITH SELF CARE TODAY

## 2011-01-26 LAB — CULTURE, BLOOD (ROUTINE X 2): Culture: NO GROWTH

## 2011-01-30 NOTE — Discharge Summary (Signed)
I have examined Lindsey Rose and discussed discharge plans with her today. I have reviewed the above discharge summary outlined by Junious Silk NP and agree with its contents.   Calvert Cantor MD [phone]

## 2011-02-21 ENCOUNTER — Encounter: Payer: Self-pay | Admitting: *Deleted

## 2011-02-22 ENCOUNTER — Ambulatory Visit (INDEPENDENT_AMBULATORY_CARE_PROVIDER_SITE_OTHER): Payer: Medicare Other | Admitting: Cardiology

## 2011-02-22 ENCOUNTER — Encounter: Payer: Self-pay | Admitting: Cardiology

## 2011-02-22 DIAGNOSIS — F17200 Nicotine dependence, unspecified, uncomplicated: Secondary | ICD-10-CM

## 2011-02-22 DIAGNOSIS — I959 Hypotension, unspecified: Secondary | ICD-10-CM

## 2011-02-22 DIAGNOSIS — Z720 Tobacco use: Secondary | ICD-10-CM

## 2011-02-22 NOTE — Assessment & Plan Note (Signed)
The patient was most likely mildly dehydrated at the time of her admission. Her symptoms improved with IV fluids. She also was treated for urinary tract infection. She has had no recurrent symptoms since discharge. Her echocardiogram showed normal LV function, her electrocardiogram is normal and her enzymes were negative. She was apparently bradycardic in the hospital but symptoms not clearly related. I do not think we need to pursue further cardiac evaluation at this time unless she develops recurrent symptoms in the future.

## 2011-02-22 NOTE — Assessment & Plan Note (Signed)
Patient counseled on discontinuing. 

## 2011-02-22 NOTE — Progress Notes (Signed)
HPI: 61 year old female with no prior cardiac history for evaluation of palpitations and hypotension. Admitted in December with dizziness and fatigue. Blood pressure was 80/40. Echocardiogram in December of 2012 showed normal LV function. TSH normal. Cardiac enzymes negative. Patient states that she was not being well heart to that admission. She also had some fatigue. She felt increased dizziness with standing that improved with sitting. She was treated with IV fluids and also for a possible urinary tract infection by her report. Her symptoms resolved and she has had no problems since. No history of syncope. She denies dyspnea on exertion, orthopnea, PND, pedal edema, exertional chest pain. Occasional brief "skipped" but no sustained palpitations.  Current Outpatient Prescriptions  Medication Sig Dispense Refill  . albuterol (PROVENTIL HFA;VENTOLIN HFA) 108 (90 BASE) MCG/ACT inhaler Inhale 2 puffs into the lungs every 6 (six) hours as needed. For shortness of breath and wheezing       . Bisacodyl (DULCOLAX PO) Take by mouth.        . celecoxib (CELEBREX) 100 MG capsule Take 100 mg by mouth 2 (two) times daily.        . cyanocobalamin 500 MCG tablet Take 1,000 mcg by mouth daily.       . Ferrous Sulfate (IRON SUPPLEMENT PO) Take 1 tablet by mouth every other day.        Marland Kitchen FLUoxetine (PROZAC) 20 MG capsule Take 20 mg by mouth daily.        . fluticasone (FLONASE) 50 MCG/ACT nasal spray Place 1 spray into both nostrils daily.        Marland Kitchen gabapentin (NEURONTIN) 300 MG capsule daily. Two tabs at night      . Glucosamine-Chondroitin (COSAMIN DS PO) Take 1 tablet by mouth daily.        . Melatonin 3 MG TABS Take 1 tablet by mouth as needed.        . Multiple Vitamin (MULTIVITAMIN) tablet Take 1 tablet by mouth daily.        Marland Kitchen oxycodone (OXYCONTIN) 30 MG TB12 Take 30 mg by mouth 3 (three) times daily.        Marland Kitchen oxyCODONE (ROXICODONE) 15 MG immediate release tablet Take 15 mg by mouth 6 (six) times daily.         . polyethylene glycol powder (GLYCOLAX/MIRALAX) powder Take 17 g by mouth 2 (two) times daily.        . traZODone (DESYREL) 50 MG tablet Take by mouth. 1-2 tablets, daily at bedtime       . Trolamine Salicylate (ASPERCREME) 10 % LOTN Apply topically as needed. For pain        . VITAMIN D, CHOLECALCIFEROL, PO Take 2,500 mg by mouth daily.          Allergies  Allergen Reactions  . Ivp Dye (Iodinated Diagnostic Agents) Hives and Shortness Of Breath  . Aleve Hives    Past Medical History  Diagnosis Date  . Breast cancer   . Arthritis   . Disc degeneration   . Anxiety   . Depression   . ADD (attention deficit disorder with hyperactivity)   . Diverticulosis   . Internal hemorrhoid   . Colon polyps     Past Surgical History  Procedure Date  . Mastectomy     Bilateral 2001  . Abdominal hysterectomy   . Cholecystectomy   . Right knee replacement   . Appendectomy     History   Social History  . Marital Status: Married  Spouse Name: N/A    Number of Children: 2  . Years of Education: N/A   Occupational History  .      Disability   Social History Main Topics  . Smoking status: Current Everyday Smoker -- 0.5 packs/day for 42 years    Types: Cigarettes  . Smokeless tobacco: Not on file  . Alcohol Use: Yes     occasional  . Drug Use: No  . Sexually Active: Yes   Other Topics Concern  . Not on file   Social History Narrative  . No narrative on file    Family History  Problem Relation Age of Onset  . Cancer Father   . Hypertension Mother   . Heart disease Father     MI at age 58  . Diabetes Mother   . Hypertension Paternal Grandfather   . Heart disease Paternal Grandfather   . Coronary artery disease Maternal Grandfather   . Coronary artery disease Maternal Grandmother   . Breast cancer Sister   . Breast cancer Sister   . Breast cancer Sister     ROS: Problems with back pain but no fevers or chills, productive cough, hemoptysis, dysphasia,  odynophagia, melena, hematochezia, dysuria, hematuria, rash, seizure activity, orthopnea, PND, pedal edema, claudication. Remaining systems are negative.  Physical Exam:   Blood pressure 127/73, pulse 70, height 5\' 3"  (1.6 m), weight 149 lb (67.586 kg).  General:  Well developed/well nourished in NAD Skin warm/dry Patient not depressed No peripheral clubbing Back-normal HEENT-normal/normal eyelids Neck supple/normal carotid upstroke bilaterally; no bruits; no JVD; no thyromegaly chest - CTA/ normal expansion; previous breast reconstruction CV - RRR/normal S1 and S2; no  rubs or gallops;  PMI nondisplaced; 1/6 systolic ejection murmur Abdomen -NT/ND, no HSM, no mass, + bowel sounds, no bruit 2+ femoral pulses, no bruits Ext-no edema, chords, 2+ DP Neuro-grossly nonfocal  ECG normal sinus rhythm at a rate of 70. No ST changes.

## 2011-03-18 ENCOUNTER — Ambulatory Visit (HOSPITAL_BASED_OUTPATIENT_CLINIC_OR_DEPARTMENT_OTHER): Payer: Medicare Other | Admitting: Oncology

## 2011-03-18 VITALS — BP 129/69 | HR 89 | Temp 97.9°F | Ht 63.0 in | Wt 148.2 lb

## 2011-03-18 DIAGNOSIS — C50919 Malignant neoplasm of unspecified site of unspecified female breast: Secondary | ICD-10-CM

## 2011-03-18 DIAGNOSIS — Z853 Personal history of malignant neoplasm of breast: Secondary | ICD-10-CM

## 2011-03-18 NOTE — Progress Notes (Signed)
OFFICE PROGRESS NOTE   INTERVAL HISTORY:   She returns as scheduled. She recently discontinue smoking. There's been no change at the chest wall. She has no complaint.  Objective:  Vital signs in last 24 hours:  Blood pressure 129/69, pulse 89, temperature 97.9 F (36.6 C), temperature source Oral, height 5\' 3"  (1.6 m), weight 148 lb 3.2 oz (67.223 kg).    HEENT: Neck without mass Lymphatics: No cervical, supraclavicular, or axillary nodes Resp: Lungs with scattered end inspiratory rails and wheezes. No respiratory distress. Cardio: Regular rate and rhythm GI: No hepatomegaly Vascular: No leg edema  Breast: Status post bilateral mastectomy with implants in place. No evidence for chest wall tumor recurrence.    Medications: I have reviewed the patient's current medications.  Assessment/Plan: 1.Bilateral invasive lobular breast cancer diagnosed in 04/1999, status post bilateral mastectomy with implants.  She was treated with adjuvant AC chemotherapy, and began tamoxifen in 08/1999.  She completed 5 years of tamoxifen, and began Femara in 07/2004.  She completed 5 years of Femara in 07/2009.  2. Status post left shoulder surgery with a decreased range of motion at the left shoulder. 3. Chronic arthralgias. 4. Status post right total knee replacement   Disposition:  She remains in clinical remission from breast cancer. She would like to continue followup at the cancer Center. She will return for an office visit in one year.   Lucile Shutters, MD  03/18/2011  12:01 PM

## 2011-06-21 ENCOUNTER — Emergency Department (INDEPENDENT_AMBULATORY_CARE_PROVIDER_SITE_OTHER): Payer: Medicare Other

## 2011-06-21 ENCOUNTER — Inpatient Hospital Stay (HOSPITAL_BASED_OUTPATIENT_CLINIC_OR_DEPARTMENT_OTHER)
Admission: EM | Admit: 2011-06-21 | Discharge: 2011-06-24 | DRG: 193 | Disposition: A | Discharge status: Home / Self Care | Payer: Medicare Other | Attending: Internal Medicine | Admitting: Internal Medicine

## 2011-06-21 ENCOUNTER — Encounter (HOSPITAL_BASED_OUTPATIENT_CLINIC_OR_DEPARTMENT_OTHER): Payer: Self-pay | Admitting: *Deleted

## 2011-06-21 DIAGNOSIS — Z96619 Presence of unspecified artificial shoulder joint: Secondary | ICD-10-CM

## 2011-06-21 DIAGNOSIS — M129 Arthropathy, unspecified: Secondary | ICD-10-CM | POA: Diagnosis present

## 2011-06-21 DIAGNOSIS — R0902 Hypoxemia: Secondary | ICD-10-CM | POA: Diagnosis present

## 2011-06-21 DIAGNOSIS — Z79899 Other long term (current) drug therapy: Secondary | ICD-10-CM

## 2011-06-21 DIAGNOSIS — R918 Other nonspecific abnormal finding of lung field: Secondary | ICD-10-CM

## 2011-06-21 DIAGNOSIS — F411 Generalized anxiety disorder: Secondary | ICD-10-CM | POA: Diagnosis present

## 2011-06-21 DIAGNOSIS — M5136 Other intervertebral disc degeneration, lumbar region: Secondary | ICD-10-CM | POA: Diagnosis present

## 2011-06-21 DIAGNOSIS — Z96659 Presence of unspecified artificial knee joint: Secondary | ICD-10-CM

## 2011-06-21 DIAGNOSIS — J9601 Acute respiratory failure with hypoxia: Secondary | ICD-10-CM

## 2011-06-21 DIAGNOSIS — M5137 Other intervertebral disc degeneration, lumbosacral region: Secondary | ICD-10-CM | POA: Diagnosis present

## 2011-06-21 DIAGNOSIS — E876 Hypokalemia: Secondary | ICD-10-CM | POA: Diagnosis present

## 2011-06-21 DIAGNOSIS — J189 Pneumonia, unspecified organism: Principal | ICD-10-CM | POA: Diagnosis present

## 2011-06-21 DIAGNOSIS — F17200 Nicotine dependence, unspecified, uncomplicated: Secondary | ICD-10-CM | POA: Diagnosis present

## 2011-06-21 DIAGNOSIS — D72829 Elevated white blood cell count, unspecified: Secondary | ICD-10-CM | POA: Diagnosis present

## 2011-06-21 DIAGNOSIS — F329 Major depressive disorder, single episode, unspecified: Secondary | ICD-10-CM | POA: Diagnosis present

## 2011-06-21 DIAGNOSIS — M51379 Other intervertebral disc degeneration, lumbosacral region without mention of lumbar back pain or lower extremity pain: Secondary | ICD-10-CM | POA: Diagnosis present

## 2011-06-21 DIAGNOSIS — Z860100 Personal history of colon polyps, unspecified: Secondary | ICD-10-CM

## 2011-06-21 DIAGNOSIS — Z853 Personal history of malignant neoplasm of breast: Secondary | ICD-10-CM

## 2011-06-21 DIAGNOSIS — J9600 Acute respiratory failure, unspecified whether with hypoxia or hypercapnia: Secondary | ICD-10-CM | POA: Diagnosis present

## 2011-06-21 DIAGNOSIS — J4489 Other specified chronic obstructive pulmonary disease: Secondary | ICD-10-CM | POA: Diagnosis present

## 2011-06-21 DIAGNOSIS — G8929 Other chronic pain: Secondary | ICD-10-CM | POA: Diagnosis present

## 2011-06-21 DIAGNOSIS — F32A Depression, unspecified: Secondary | ICD-10-CM | POA: Diagnosis present

## 2011-06-21 DIAGNOSIS — R059 Cough, unspecified: Secondary | ICD-10-CM

## 2011-06-21 DIAGNOSIS — F419 Anxiety disorder, unspecified: Secondary | ICD-10-CM | POA: Diagnosis present

## 2011-06-21 DIAGNOSIS — R05 Cough: Secondary | ICD-10-CM

## 2011-06-21 DIAGNOSIS — R0602 Shortness of breath: Secondary | ICD-10-CM

## 2011-06-21 DIAGNOSIS — F3289 Other specified depressive episodes: Secondary | ICD-10-CM | POA: Diagnosis present

## 2011-06-21 DIAGNOSIS — J449 Chronic obstructive pulmonary disease, unspecified: Secondary | ICD-10-CM | POA: Diagnosis present

## 2011-06-21 DIAGNOSIS — D649 Anemia, unspecified: Secondary | ICD-10-CM | POA: Diagnosis present

## 2011-06-21 HISTORY — DX: Personal history of other diseases of the respiratory system: Z87.09

## 2011-06-21 HISTORY — DX: Attention-deficit hyperactivity disorder, unspecified type: F90.9

## 2011-06-21 HISTORY — DX: Low back pain, unspecified: M54.50

## 2011-06-21 HISTORY — DX: Other chronic pain: G89.29

## 2011-06-21 HISTORY — DX: Gastro-esophageal reflux disease without esophagitis: K21.9

## 2011-06-21 HISTORY — DX: Pneumonia, unspecified organism: J18.9

## 2011-06-21 HISTORY — DX: Anemia, unspecified: D64.9

## 2011-06-21 HISTORY — DX: Low back pain: M54.5

## 2011-06-21 HISTORY — DX: Acute respiratory distress: R06.03

## 2011-06-21 HISTORY — DX: Other bursitis of hip, unspecified hip: M70.70

## 2011-06-21 HISTORY — DX: Insomnia, unspecified: G47.00

## 2011-06-21 HISTORY — DX: Personal history of antineoplastic chemotherapy: Z92.21

## 2011-06-21 LAB — CBC
MCH: 30 pg (ref 26.0–34.0)
MCHC: 34.6 g/dL (ref 30.0–36.0)
MCV: 86.6 fL (ref 78.0–100.0)
Platelets: 241 10*3/uL (ref 150–400)
RDW: 13.3 % (ref 11.5–15.5)
WBC: 20.8 10*3/uL — ABNORMAL HIGH (ref 4.0–10.5)

## 2011-06-21 LAB — DIFFERENTIAL
Basophils Absolute: 0 10*3/uL (ref 0.0–0.1)
Basophils Relative: 0 % (ref 0–1)
Eosinophils Absolute: 0.2 10*3/uL (ref 0.0–0.7)
Eosinophils Relative: 1 % (ref 0–5)
Lymphocytes Relative: 8 % — ABNORMAL LOW (ref 12–46)
Monocytes Absolute: 1.3 10*3/uL — ABNORMAL HIGH (ref 0.1–1.0)
Neutrophils Relative %: 85 % — ABNORMAL HIGH (ref 43–77)

## 2011-06-21 NOTE — ED Provider Notes (Addendum)
History     CSN: [id number]  Arrival date & time 06/21/11  2300   First MD Initiated Contact with Patient 06/21/11 2331      Chief Complaint  Patient presents with  . Shortness of Breath    (Consider location/radiation/quality/duration/timing/severity/associated sxs/prior treatment) HPI This is a 61 year old white female with about a two-week history of respiratory infection. By this she means primarily cough. She's been using albuterol and was placed on amoxicillin a week ago. She is here this evening because her symptoms have worsened today. She is complaining of shortness of breath, worse with exertion, along with low-grade fever, general malaise and worsening cough. She has chest soreness with cough and has had difficulty coughing up phlegm. She was noted to be hypoxic in triage. She denies nausea or vomiting.  Past Medical History  Diagnosis Date  . Breast cancer   . Arthritis   . Disc degeneration   . Anxiety   . Depression   . ADD (attention deficit disorder with hyperactivity)   . Diverticulosis   . Internal hemorrhoid   . Colon polyps     Past Surgical History  Procedure Date  . Mastectomy     Bilateral 2001  . Abdominal hysterectomy   . Cholecystectomy   . Right knee replacement   . Appendectomy   . Total shoulder replacement     Family History  Problem Relation Age of Onset  . Cancer Father   . Hypertension Mother   . Heart disease Father     MI at age 3  . Diabetes Mother   . Hypertension Paternal Grandfather   . Heart disease Paternal Grandfather   . Coronary artery disease Maternal Grandfather   . Coronary artery disease Maternal Grandmother   . Breast cancer Sister   . Breast cancer Sister   . Breast cancer Sister     History  Substance Use Topics  . Smoking status: Current Everyday Smoker -- 0.5 packs/day for 42 years    Types: Cigarettes  . Smokeless tobacco: Not on file  . Alcohol Use: Yes     occasional    OB History    Grav Para  Term Preterm Abortions TAB SAB Ect Mult Living                  Review of Systems  All other systems reviewed and are negative.    Allergies  Ivp dye and Naproxen sodium  Home Medications   Current Outpatient Rx  Name Route Sig Dispense Refill  . ALBUTEROL SULFATE HFA 108 (90 BASE) MCG/ACT IN AERS Inhalation Inhale 2 puffs into the lungs every 6 (six) hours as needed. For shortness of breath and wheezing     . DULCOLAX PO Oral Take by mouth.      . CELECOXIB 100 MG PO CAPS Oral Take 100 mg by mouth 2 (two) times daily.      . CYANOCOBALAMIN 500 MCG PO TABS Oral Take 1,000 mcg by mouth daily.     . IRON SUPPLEMENT PO Oral Take 1 tablet by mouth every other day.      Marland Kitchen FLUOXETINE HCL 20 MG PO CAPS Oral Take 20 mg by mouth daily.      Marland Kitchen FLUTICASONE PROPIONATE 50 MCG/ACT NA SUSP Each Nare Place 1 spray into both nostrils daily.      Marland Kitchen GABAPENTIN 300 MG PO CAPS  600 mg daily as needed. Two tabs at night    . COSAMIN DS PO Oral Take 1  tablet by mouth daily.      Marland Kitchen MELATONIN 3 MG PO TABS Oral Take 1 tablet by mouth as needed.      Marland Kitchen ONE-DAILY MULTI VITAMINS PO TABS Oral Take 1 tablet by mouth daily.      . OXYCODONE HCL ER 30 MG PO TB12 Oral Take 30 mg by mouth 3 (three) times daily.      . OXYCODONE HCL 15 MG PO TABS Oral Take 15 mg by mouth 4 (four) times daily.     Marland Kitchen POLYETHYLENE GLYCOL 3350 PO POWD Oral Take 17 g by mouth daily as needed.     . TROLAMINE SALICYLATE 10 % EX LOTN Apply externally Apply topically as needed. For pain      . VITAMIN D (CHOLECALCIFEROL) PO Oral Take 2,500 mg by mouth daily.        BP 104/60  Pulse 76  Temp(Src) 98.1 F (36.7 C) (Oral)  Resp 20  SpO2 94%  Physical Exam General: Well-developed, well-nourished female in no acute distress; appearance consistent with age of record HENT: normocephalic, atraumatic Eyes: pupils equal round and reactive to light; extraocular muscles intact Neck: supple Heart: regular rate and rhythm; tachycardic Lungs:  Tachypnea; rales in bases bilaterally Abdomen: soft; nondistended; nontender; bowel sounds present Extremities: No deformity; full range of motion; pulses normal Neurologic: Awake, alert and oriented; motor function intact in all extremities and symmetric; no facial droop Skin: Warm and dry     ED Course  Procedures (including critical care time)     MDM   Nursing notes and vitals signs, including pulse oximetry, reviewed.  Summary of this visit's results, reviewed by myself:  Labs:  Results for orders placed during the hospital encounter of 06/21/11  CBC      Component Value Range   WBC 20.8 (*) 4.0 - 10.5 (K/uL)   RBC 3.97  3.87 - 5.11 (MIL/uL)   Hemoglobin 11.9 (*) 12.0 - 15.0 (g/dL)   HCT 16.1 (*) 09.6 - 46.0 (%)   MCV 86.6  78.0 - 100.0 (fL)   MCH 30.0  26.0 - 34.0 (pg)   MCHC 34.6  30.0 - 36.0 (g/dL)   RDW 04.5  40.9 - 81.1 (%)   Platelets 241  150 - 400 (K/uL)  DIFFERENTIAL      Component Value Range   Neutrophils Relative 85 (*) 43 - 77 (%)   Neutro Abs 17.7 (*) 1.7 - 7.7 (K/uL)   Lymphocytes Relative 8 (*) 12 - 46 (%)   Lymphs Abs 1.6  0.7 - 4.0 (K/uL)   Monocytes Relative 6  3 - 12 (%)   Monocytes Absolute 1.3 (*) 0.1 - 1.0 (K/uL)   Eosinophils Relative 1  0 - 5 (%)   Eosinophils Absolute 0.2  0.0 - 0.7 (K/uL)   Basophils Relative 0  0 - 1 (%)   Basophils Absolute 0.0  0.0 - 0.1 (K/uL)  COMPREHENSIVE METABOLIC PANEL      Component Value Range   Sodium 133 (*) 135 - 145 (mEq/L)   Potassium 3.6  3.5 - 5.1 (mEq/L)   Chloride 98  96 - 112 (mEq/L)   CO2 26  19 - 32 (mEq/L)   Glucose, Bld 137 (*) 70 - 99 (mg/dL)   BUN 13  6 - 23 (mg/dL)   Creatinine, Ser 9.14  0.50 - 1.10 (mg/dL)   Calcium 9.1  8.4 - 78.2 (mg/dL)   Total Protein 7.3  6.0 - 8.3 (g/dL)   Albumin 3.2 (*)  3.5 - 5.2 (g/dL)   AST 15  0 - 37 (U/L)   ALT 9  0 - 35 (U/L)   Alkaline Phosphatase 113  39 - 117 (U/L)   Total Bilirubin 0.4  0.3 - 1.2 (mg/dL)   GFR calc non Af Amer >90  >90 (mL/min)    GFR calc Af Amer >90  >90 (mL/min)  PRO B NATRIURETIC PEPTIDE      Component Value Range   Pro B Natriuretic peptide (BNP) 394.1 (*) 0 - 125 (pg/mL)  TROPONIN I      Component Value Range   Troponin I <0.30  <0.30 (ng/mL)  D-DIMER, QUANTITATIVE      Component Value Range   D-Dimer, Quant 1.66 (*) 0.00 - 0.48 (ug/mL-FEU)  POCT I-STAT 3, BLOOD GAS (G3+)      Component Value Range   pH, Arterial 7.345 (*) 7.350 - 7.400    pCO2 arterial 46.3 (*) 35.0 - 45.0 (mmHg)   pO2, Arterial 77.0 (*) 80.0 - 100.0 (mmHg)   Bicarbonate 25.3 (*) 20.0 - 24.0 (mEq/L)   TCO2 27  0 - 100 (mmol/L)   O2 Saturation 95.0     Acid-base deficit 1.0  0.0 - 2.0 (mmol/L)   Patient temperature 98.1 F     Collection site RADIAL, Pastorino'S TEST ACCEPTABLE     Drawn by RT     Sample type ARTERIAL      Imaging Studies: Dg Chest 2 View  06/22/2011  *RADIOLOGY REPORT*  Clinical Data: Shortness of breath and cough.  CHEST - 2 VIEW  Comparison: Chest radiograph performed 01/19/2011  Findings: The lungs are well-aerated.  Mild bibasilar airspace opacities may reflect mild pneumonia.  There is no evidence of pleural effusion or pneumothorax.  The heart is normal in size; the mediastinal contour is within normal limits.  No acute osseous abnormalities are seen.  Pins are noted at the left humeral head.  IMPRESSION: Mild bibasilar airspace opacities may reflect mild pneumonia.  Original Report Authenticated By: Tonia Ghent, M.D.   Ct Angio Chest W/cm &/or Wo Cm  06/22/2011  *RADIOLOGY REPORT*  Clinical Data: Shortness of breath and cough.  CT ANGIOGRAPHY CHEST  Technique:  Multidetector CT imaging of the chest using the standard protocol during bolus administration of intravenous contrast. Multiplanar reconstructed images including MIPs were obtained and reviewed to evaluate the vascular anatomy.  Contrast: 80mL OMNIPAQUE IOHEXOL 350 MG/ML SOLN  Comparison: Chest radiograph performed 06/21/2011, and CTA of the chest performed  08/24/2009  Findings: There is no evidence of significant pulmonary embolus. Evaluation for pulmonary embolus is suboptimal in areas of airspace consolidation.  Patchy airspace opacification is noted at both lung bases, compatible with pneumonia.  Additional ground-glass airspace opacification is seen throughout the expanded portions of both lungs.  There is no evidence of pleural effusion or pneumothorax. No masses are identified; no abnormal focal contrast enhancement is seen.  Scattered hilar and mediastinal nodes are borderline normal in size, aside from a mildly prominent 1.1 cm right hilar node.  No pericardial effusion is identified.  There is mild luminal narrowing along the left subclavian artery due to intramural thrombus.  No axillary lymphadenopathy is seen.  The visualized portions of the thyroid gland are unremarkable in appearance.  Mild apparent wall thickening along the distal esophagus may reflect mild esophagitis.  The visualized portions of the liver and spleen are unremarkable.  Bilateral breast implants are noted.  No acute osseous abnormalities are seen.  Screws are noted at  the left humeral head.  IMPRESSION:  1.  No evidence of significant pulmonary embolus. 2.  Bibasilar pneumonia noted; additional ground-glass airspace opacification throughout the expanded portions of both lungs, also compatible with pneumonia. 3.  Borderline prominent right hilar node. 4.  Mild apparent wall thickening along the distal esophagus may reflect mild esophagitis. 5.  Mild luminal narrowing along the left subclavian artery due to a small amount of intramural thrombus.  Original Report Authenticated By: Tonia Ghent, M.D.    Date: 06/21/2011 23:25  Rate: 97  Rhythm: normal sinus rhythm  QRS Axis: normal  Intervals: normal  ST/T Wave abnormalities: normal  Conduction Disutrbances: none  Narrative Interpretation: unremarkable  Previous EKG: Rate is faster  12:51 AM We'll start antibiotics for  presumed early pneumonia. The patient's recent airplane flight also raises concern of pulmonary embolism, and chest x-ray findings do not fully explain the patient's tachypnea and hypoxia. We will premedicate do to history of urticaria due to IVP dye and obtain CT imaging the chest.                Hanley Seamen, MD 06/22/11 1610  Hanley Seamen, MD 06/22/11 [phone]

## 2011-06-21 NOTE — ED Notes (Signed)
Patient transported to X-ray. O2 Sat 93% on 2L [place].  Restricted arm band placed on left arm.

## 2011-06-21 NOTE — ED Notes (Signed)
Pt has had a URI for approx 2 weeks. Was prescribed antibiotics without some relief. Pt had a flight in from [place] today and was SOB at that time but worsened this evening. Pt c/o fatigue, cough and SOB.

## 2011-06-22 ENCOUNTER — Encounter (HOSPITAL_COMMUNITY): Payer: Self-pay | Admitting: Internal Medicine

## 2011-06-22 ENCOUNTER — Emergency Department (INDEPENDENT_AMBULATORY_CARE_PROVIDER_SITE_OTHER): Payer: Medicare Other

## 2011-06-22 DIAGNOSIS — F17200 Nicotine dependence, unspecified, uncomplicated: Secondary | ICD-10-CM

## 2011-06-22 DIAGNOSIS — R05 Cough: Secondary | ICD-10-CM

## 2011-06-22 DIAGNOSIS — J189 Pneumonia, unspecified organism: Secondary | ICD-10-CM

## 2011-06-22 DIAGNOSIS — D696 Thrombocytopenia, unspecified: Secondary | ICD-10-CM

## 2011-06-22 DIAGNOSIS — J9600 Acute respiratory failure, unspecified whether with hypoxia or hypercapnia: Secondary | ICD-10-CM

## 2011-06-22 DIAGNOSIS — R0602 Shortness of breath: Secondary | ICD-10-CM

## 2011-06-22 DIAGNOSIS — D7289 Other specified disorders of white blood cells: Secondary | ICD-10-CM

## 2011-06-22 DIAGNOSIS — R059 Cough, unspecified: Secondary | ICD-10-CM

## 2011-06-22 HISTORY — DX: Pneumonia, unspecified organism: J18.9

## 2011-06-22 LAB — COMPREHENSIVE METABOLIC PANEL
ALT: 9 U/L (ref 0–35)
ALT: 9 U/L (ref 0–35)
AST: 15 U/L (ref 0–37)
AST: 16 U/L (ref 0–37)
Albumin: 2.8 g/dL — ABNORMAL LOW (ref 3.5–5.2)
Albumin: 3.2 g/dL — ABNORMAL LOW (ref 3.5–5.2)
Alkaline Phosphatase: 117 U/L (ref 39–117)
CO2: 22 mEq/L (ref 19–32)
Calcium: 9 mg/dL (ref 8.4–10.5)
Calcium: 9.1 mg/dL (ref 8.4–10.5)
Chloride: 102 mEq/L (ref 96–112)
Creatinine, Ser: 0.48 mg/dL — ABNORMAL LOW (ref 0.50–1.10)
Creatinine, Ser: 0.6 mg/dL (ref 0.50–1.10)
GFR calc non Af Amer: >90 mL/min (ref >90)
Potassium: 4.1 mEq/L (ref 3.5–5.1)
Sodium: 133 mEq/L — ABNORMAL LOW (ref 135–145)
Total Bilirubin: 0.2 mg/dL — ABNORMAL LOW (ref 0.3–1.2)
Total Bilirubin: 0.4 mg/dL (ref 0.3–1.2)
Total Protein: 7.3 g/dL (ref 6.0–8.3)

## 2011-06-22 LAB — CBC
HCT: 34.7 % — ABNORMAL LOW (ref 36.0–46.0)
MCH: 29 pg (ref 26.0–34.0)
MCHC: 32.9 g/dL (ref 30.0–36.0)
MCV: 88.3 fL (ref 78.0–100.0)
Platelets: 234 10*3/uL (ref 150–400)
RBC: 3.93 MIL/uL (ref 3.87–5.11)
RDW: 13.7 % (ref 11.5–15.5)

## 2011-06-22 LAB — TROPONIN I: Troponin I: <0.3 ng/mL (ref <0.30)

## 2011-06-22 LAB — DIFFERENTIAL
Basophils Absolute: 0 10*3/uL (ref 0.0–0.1)
Basophils Relative: 0 % (ref 0–1)
Eosinophils Absolute: 0 10*3/uL (ref 0.0–0.7)
Eosinophils Relative: 0 % (ref 0–5)
Lymphs Abs: 1 10*3/uL (ref 0.7–4.0)
Neutrophils Relative %: 94 % — ABNORMAL HIGH (ref 43–77)

## 2011-06-22 LAB — POCT I-STAT 3, ART BLOOD GAS (G3+)
Acid-base deficit: 1 mmol/L (ref 0.0–2.0)
Bicarbonate: 25.3 mEq/L — ABNORMAL HIGH (ref 20.0–24.0)
TCO2: 27 mmol/L (ref 0–100)
pH, Arterial: 7.345 — ABNORMAL LOW (ref 7.350–7.400)
pO2, Arterial: 77 mmHg — ABNORMAL LOW (ref 80.0–100.0)

## 2011-06-22 LAB — PRO B NATRIURETIC PEPTIDE
Pro B Natriuretic peptide (BNP): 394.1 pg/mL — ABNORMAL HIGH (ref 0–125)
Pro B Natriuretic peptide (BNP): 483.3 pg/mL — ABNORMAL HIGH (ref 0–125)

## 2011-06-22 LAB — STREP PNEUMONIAE URINARY ANTIGEN: Strep Pneumo Urinary Antigen: NEGATIVE

## 2011-06-22 LAB — D-DIMER, QUANTITATIVE: D-Dimer, Quant: 1.66 ug/mL-FEU — ABNORMAL HIGH (ref 0.00–0.48)

## 2011-06-22 MED ORDER — GUAIFENESIN ER 600 MG PO TB12
1200.0000 mg | ORAL_TABLET | Freq: Two times a day (BID) | ORAL | Status: DC
  Administered 2011-06-22 – 2011-06-23 (×4): 1200 mg via ORAL
  Filled 2011-06-22 (×6): qty 2

## 2011-06-22 MED ORDER — DEXTROSE 5 % IV SOLN
500.0000 mg | Freq: Once | INTRAVENOUS | Status: AC
  Administered 2011-06-22: 500 mg via INTRAVENOUS
  Filled 2011-06-22: qty 500

## 2011-06-22 MED ORDER — SODIUM CHLORIDE 0.9 % IV BOLUS (SEPSIS)
1000.0000 mL | Freq: Once | INTRAVENOUS | Status: AC
  Administered 2011-06-22: 1000 mL via INTRAVENOUS

## 2011-06-22 MED ORDER — METHYLPREDNISOLONE SODIUM SUCC 125 MG IJ SOLR
125.0000 mg | Freq: Once | INTRAMUSCULAR | Status: AC
  Administered 2011-06-22: 125 mg via INTRAVENOUS
  Filled 2011-06-22: qty 2

## 2011-06-22 MED ORDER — FLUOXETINE HCL 20 MG PO CAPS
20.0000 mg | ORAL_CAPSULE | Freq: Every day | ORAL | Status: DC
Start: 2011-06-22 — End: 2011-06-24
  Administered 2011-06-22 – 2011-06-23 (×2): 20 mg via ORAL
  Filled 2011-06-22 (×4): qty 1

## 2011-06-22 MED ORDER — CEFTRIAXONE SODIUM 1 G IJ SOLR
1.0000 g | INTRAMUSCULAR | Status: DC
  Administered 2011-06-22 – 2011-06-24 (×3): 1 g via INTRAVENOUS
  Filled 2011-06-22 (×3): qty 10

## 2011-06-22 MED ORDER — ACETAMINOPHEN 650 MG RE SUPP: 650.0000 mg | Freq: Four times a day (QID) | RECTAL | Status: DC | PRN

## 2011-06-22 MED ORDER — ENOXAPARIN SODIUM 40 MG/0.4ML ~~LOC~~ SOLN
40.0000 mg | SUBCUTANEOUS | Status: DC
  Administered 2011-06-22 – 2011-06-23 (×2): 40 mg via SUBCUTANEOUS
  Filled 2011-06-22 (×2): qty 0.4

## 2011-06-22 MED ORDER — POLYETHYLENE GLYCOL 3350 17 GM/SCOOP PO POWD
17.0000 g | Freq: Every day | ORAL | Status: DC | PRN
  Filled 2011-06-22: qty 255

## 2011-06-22 MED ORDER — GABAPENTIN 300 MG PO CAPS
600.0000 mg | ORAL_CAPSULE | Freq: Every day | ORAL | Status: DC | PRN
  Administered 2011-06-23 (×2): 600 mg via ORAL
  Filled 2011-06-22 (×2): qty 2

## 2011-06-22 MED ORDER — DIPHENHYDRAMINE HCL 50 MG/ML IJ SOLN
50.0000 mg | Freq: Once | INTRAMUSCULAR | Status: AC
  Administered 2011-06-22: 50 mg via INTRAVENOUS
  Filled 2011-06-22: qty 1

## 2011-06-22 MED ORDER — IPRATROPIUM BROMIDE 0.02 % IN SOLN
0.5000 mg | Freq: Four times a day (QID) | RESPIRATORY_TRACT | Status: DC
  Administered 2011-06-22 (×2): 0.5 mg via RESPIRATORY_TRACT
  Filled 2011-06-22 (×2): qty 2.5

## 2011-06-22 MED ORDER — FERROUS SULFATE 325 (65 FE) MG PO TABS
325.0000 mg | ORAL_TABLET | Freq: Every day | ORAL | Status: DC
  Administered 2011-06-22 – 2011-06-24 (×3): 325 mg via ORAL
  Filled 2011-06-22 (×4): qty 1

## 2011-06-22 MED ORDER — SODIUM CHLORIDE 0.9 % IV SOLN
INTRAVENOUS | Status: DC
  Administered 2011-06-22: 07:00:00 via INTRAVENOUS

## 2011-06-22 MED ORDER — SODIUM CHLORIDE 0.9 % IV SOLN
INTRAVENOUS | Status: DC
  Administered 2011-06-22: 17:00:00 via INTRAVENOUS

## 2011-06-22 MED ORDER — SODIUM CHLORIDE 0.9 % IJ SOLN
3.0000 mL | Freq: Two times a day (BID) | INTRAMUSCULAR | Status: DC
  Administered 2011-06-22 – 2011-06-23 (×3): 3 mL via INTRAVENOUS

## 2011-06-22 MED ORDER — ACETAMINOPHEN 325 MG PO TABS: 650.0000 mg | ORAL_TABLET | Freq: Four times a day (QID) | ORAL | Status: DC | PRN

## 2011-06-22 MED ORDER — VITAMIN B-12 1000 MCG PO TABS
1000.0000 ug | ORAL_TABLET | Freq: Every day | ORAL | Status: DC
  Administered 2011-06-22 – 2011-06-23 (×2): 1000 ug via ORAL
  Filled 2011-06-22 (×3): qty 1

## 2011-06-22 MED ORDER — OXYCODONE HCL 5 MG PO TABS
15.0000 mg | ORAL_TABLET | Freq: Four times a day (QID) | ORAL | Status: DC | PRN
  Administered 2011-06-22 – 2011-06-24 (×5): 15 mg via ORAL
  Filled 2011-06-22 (×6): qty 3

## 2011-06-22 MED ORDER — ONDANSETRON HCL 4 MG/2ML IJ SOLN: 4.0000 mg | Freq: Four times a day (QID) | INTRAMUSCULAR | Status: DC | PRN

## 2011-06-22 MED ORDER — IOHEXOL 350 MG/ML SOLN
80.0000 mL | Freq: Once | INTRAVENOUS | Status: AC | PRN
  Administered 2011-06-22: 80 mL via INTRAVENOUS

## 2011-06-22 MED ORDER — ALBUTEROL SULFATE (5 MG/ML) 0.5% IN NEBU: 2.5000 mg | INHALATION_SOLUTION | RESPIRATORY_TRACT | Status: DC | PRN

## 2011-06-22 MED ORDER — ALBUTEROL SULFATE (5 MG/ML) 0.5% IN NEBU
2.5000 mg | INHALATION_SOLUTION | Freq: Four times a day (QID) | RESPIRATORY_TRACT | Status: DC
  Administered 2011-06-22 – 2011-06-23 (×3): 2.5 mg via RESPIRATORY_TRACT
  Filled 2011-06-22 (×4): qty 0.5

## 2011-06-22 MED ORDER — DEXTROSE 5 % IV SOLN
500.0000 mg | INTRAVENOUS | Status: DC
  Administered 2011-06-22 – 2011-06-24 (×3): 500 mg via INTRAVENOUS
  Filled 2011-06-22 (×3): qty 500

## 2011-06-22 MED ORDER — OXYCODONE HCL 5 MG PO TABS: 15.0000 mg | ORAL_TABLET | Freq: Four times a day (QID) | ORAL | Status: DC

## 2011-06-22 MED ORDER — ONDANSETRON HCL 4 MG PO TABS: 4.0000 mg | ORAL_TABLET | Freq: Four times a day (QID) | ORAL | Status: DC | PRN

## 2011-06-22 MED ORDER — IPRATROPIUM BROMIDE 0.02 % IN SOLN
0.5000 mg | Freq: Four times a day (QID) | RESPIRATORY_TRACT | Status: DC
  Administered 2011-06-23 (×2): 0.5 mg via RESPIRATORY_TRACT
  Filled 2011-06-22 (×2): qty 2.5

## 2011-06-22 MED ORDER — CEFTRIAXONE SODIUM 1 G IJ SOLR
1.0000 g | Freq: Once | INTRAMUSCULAR | Status: AC
  Administered 2011-06-22: 1 g via INTRAVENOUS
  Filled 2011-06-22: qty 10

## 2011-06-22 MED ORDER — OXYCODONE HCL 10 MG PO TB12
30.0000 mg | ORAL_TABLET | Freq: Three times a day (TID) | ORAL | Status: DC
  Administered 2011-06-22 – 2011-06-23 (×6): 30 mg via ORAL
  Filled 2011-06-22 (×6): qty 3

## 2011-06-22 MED ORDER — FLUTICASONE PROPIONATE 50 MCG/ACT NA SUSP
1.0000 | Freq: Every day | NASAL | Status: DC
  Administered 2011-06-22 – 2011-06-23 (×2): 1 via NASAL
  Filled 2011-06-22: qty 16

## 2011-06-22 NOTE — Care Management Note (Signed)
    Page 1 of 1   06/24/2011     11:36:00 AM   CARE MANAGEMENT NOTE 06/24/2011  Patient:  Lindsey Rose, Lindsey Rose   Account Number:  [id number]  Date Initiated:  06/22/2011  Documentation initiated by:  Letha Cape  Subjective/Objective Assessment:   dx bil pna  admit- lives with spouse.  pta independent.     Action/Plan:   pt eval- no pt needs   Anticipated DC Date:  06/24/2011   Anticipated DC Plan:  HOME/SELF CARE      DC Planning Services  CM consult      Choice offered to / List presented to:             Status of service:  Completed, signed off Medicare Important Message given?   (If response is "NO", the following Medicare IM given date fields will be blank) Date Medicare IM given:   Date Additional Medicare IM given:    Discharge Disposition:  HOME/SELF CARE  Per UR Regulation:  Reviewed for med. necessity/level of care/duration of stay  If discussed at Long Length of Stay Meetings, dates discussed:    Comments:  PCP Dalbert Mayotte  06/24/11 11:34 Letha Cape RN, BSN [phone] patient discharged to home today, patient has no needs.  06/22/11 15:01 Letha Cape RN, BSN [phone] patient lives with spouse.  PTA indpendent.  Patient has medicare part D for medication coverage and also has [employer] [place], she has transportation whenever she is ready for dc. Per physical therapy patient has no pt needs.  NCM will continue to follow for dc needs.

## 2011-06-22 NOTE — Progress Notes (Signed)
Patient ID: Lindsey Rose, female   DOB: 1950-04-06, 61 y.o.   MRN: [id number] PATIENT DETAILS Name: Lindsey Rose Age: 61 y.o. Sex: female Date of Birth: 06/16/1950 Admit Date: 06/21/2011 PCP:  Dalbert Mayotte, MD, MD    Interim History: H/O breast cancer, s/p double mastectomy and chemotherapy.  H/O mild COPD on occassional albuterol inhaler.  SOB x 2 weeks.  Failed outpt treatment with 7 day course of Augmentin.  Continues to smoke.  Subjective: Feels better than yesterday.  Becomes significantly SOB with exertion.  +Cough and low grade fever.  Denies diarrhea/vomiting.  Objective: Weight change:   Intake/Output Summary (Last 24 hours) at 06/22/11 0741 Last data filed at 06/22/11 0559  Gross per 24 hour  Intake      0 ml  Output    150 ml  Net   -150 ml   Blood pressure 102/64, pulse 84, temperature 98.4 F (36.9 C), temperature source Axillary, resp. rate 24, height 5' 2.5" (1.588 m), weight 68.1 kg (150 lb 2.1 oz), SpO2 96.00%. Filed Vitals:   06/22/11 0159 06/22/11 0417 06/22/11 0431 06/22/11 0553  BP: 114/58 104/60  102/64  Pulse: 93 76  84  Temp: 99.5 F (37.5 C)  98.1 F (36.7 C) 98.4 F (36.9 C)  TempSrc: Oral  Oral Axillary  Resp: 20 20  24   Height:    5' 2.5" (1.588 m)  Weight:    68.1 kg (150 lb 2.1 oz)  SpO2: 95% 94%  96%    Physical Exam: General: No acute distress, sleepy,  On 40% vente mask. Lungs: Posterior crackles, no accessory muscle movement. Cardiovascular: Regular rate and rhythm without murmur gallop or rub normal S1 and S2 Abdomen: Nontender, nondistended, soft, bowel sounds positive, no rebound, no ascites, no appreciable mass Extremities: No significant cyanosis, clubbing, or edema bilateral lower extremities  Basic Metabolic Panel:  Lab 06/21/11 4098  NA 133*  K 3.6  CL 98  CO2 26  GLUCOSE 137*  BUN 13  CREATININE 0.60  CALCIUM 9.1  MG --  PHOS --   Liver Function Tests:  Lab 06/21/11 2332  AST 15  ALT 9  ALKPHOS 113  BILITOT  0.4  PROT 7.3  ALBUMIN 3.2*   CBC:  Lab 06/21/11 2332  WBC 20.8*  NEUTROABS 17.7*  HGB 11.9*  HCT 34.4*  MCV 86.6  PLT 241   Cardiac Enzymes:  Lab 06/21/11 2326  CKTOTAL --  CKMB --  CKMBINDEX --  TROPONINI <0.30   BNP: No components found with this basename: POCBNP:3 D-Dimer:  Lab 06/21/11 2326  DDIMER 1.66*    Studies/Results: Scheduled Meds:   . albuterol  2.5 mg Nebulization Q6H  . azithromycin  500 mg Intravenous Once  . azithromycin  500 mg Intravenous Q24H  . cefTRIAXone (ROCEPHIN)  IV  1 g Intravenous Once  . cefTRIAXone (ROCEPHIN)  IV  1 g Intravenous Q24H  . diphenhydrAMINE  50 mg Intravenous Once  . enoxaparin  40 mg Subcutaneous Q24H  . ferrous sulfate  325 mg Oral Q breakfast  . FLUoxetine  20 mg Oral Daily  . fluticasone  1 spray Each Nare Daily  . methylPREDNISolone (SOLU-MEDROL) injection  125 mg Intravenous Once  . oxyCODONE  15 mg Oral QID  . oxycodone  30 mg Oral TID  . sodium chloride  1,000 mL Intravenous Once  . sodium chloride  3 mL Intravenous Q12H  . cyanocobalamin  1,000 mcg Oral Daily   Continuous Infusions:   . sodium  chloride 75 mL/hr at 06/22/11 0648   PRN Meds:.acetaminophen, acetaminophen, albuterol, gabapentin, iohexol, ondansetron (ZOFRAN) IV, ondansetron, polyethylene glycol powder  Anti-infectives:  Anti-infectives     Start     Dose/Rate Route Frequency Ordered Stop   06/22/11 0700   cefTRIAXone (ROCEPHIN) 1 g in dextrose 5 % 50 mL IVPB        1 g 100 mL/hr over 30 Minutes Intravenous Every 24 hours 06/22/11 0635     06/22/11 0700   azithromycin (ZITHROMAX) 500 mg in dextrose 5 % 250 mL IVPB        500 mg 250 mL/hr over 60 Minutes Intravenous Every 24 hours 06/22/11 0635     06/22/11 0100   cefTRIAXone (ROCEPHIN) 1 g in dextrose 5 % 50 mL IVPB        1 g 100 mL/hr over 30 Minutes Intravenous  Once 06/22/11 0055 06/22/11 0146   06/22/11 0100   azithromycin (ZITHROMAX) 500 mg in dextrose 5 % 250 mL IVPB         500 mg 250 mL/hr over 60 Minutes Intravenous  Once 06/22/11 0055 06/22/11 0407          Assessment/Plan:  #1.  CAP, failed outpatient Augmentin.  On Zithromax and Rocephin. Urine legionella and strep antigen pending.  Will add on blood cultures (unfortunately after AB given).  #2. Acute hypoxic respiratory failure-secondary to above, CT angioplasty chest negative for pulmonary embolism. This morning she was on a 40% Ventimask, she is now being titrated to oxygen via nasal cannula.  #3.  Chronic pain secondary to degenerative disc disease - continue present medications.   #4. Normocytic and normochromic anemia - reviewing her chart patient did have anemia back in December in 2012. Closely follow CBC. In the interim continue with iron supplementation and vitamin B12 supplementation  #5. History of breast cancer status post mastectomy and chemotherapy in 2001.   #6. Ongoing tobacco abuse - advised patient to quit smoking.   #7. Leukocytosis from #1 reason.  #8. COPD/chronic bronchitis-will continue with scheduled Atrovent  #9. Depression-continue with fluoxetine   DVT Prophylaxis: lovenox  Disposition:  Home when medically ready.  CODE STATUS: Full code   LOS: 1 day   Stephani Police 06/22/2011, 7:41 AM [phone]

## 2011-06-22 NOTE — Progress Notes (Signed)
Pt placed on 40% venti-mask to maintain saturations greater than 90%. Will continue to monitor and maintain

## 2011-06-22 NOTE — H&P (Signed)
Lindsey Rose is an 61 y.o. female.   PCP - Dr.Allison Drue Second. Chief Complaint: Shortness of breath. HPI: 61 year-old female with known history of breast cancer status post mastectomy and chemotherapy in 2001, chronic pain secondary to degenerative disc disease and ongoing tobacco abuse has been experiencing cough, shortness of breath with subjective feeling of fever and chills over the last 2 weeks. Initially she went to an urgent care where she was given amoxicillin and she took it for a week. Despite taking this patient still short of breath with cough nonproductive and subjective feeling of fever chills which has been progressively worsening and patient decided to come to the [place] at [facility] [place]. Patient had CT angiogram of the chest done which shows bilateral multifocal pneumonia. In addition patient was found to have leukocytosis and has been started on ceftriaxone and Zithromax for community-acquired pneumonia. Patient gets chest pain only on coughing otherwise denies any nausea vomiting abdominal pain dizziness or loss of consciousness or diarrhea. Patient has had a recent travel to [place] to visit her family. Her husband also had several symptoms which got better with antibiotics.  Past Medical History  Diagnosis Date  . Breast cancer   . Arthritis   . Disc degeneration   . Anxiety   . Depression   . ADD (attention deficit disorder with hyperactivity)   . Diverticulosis   . Internal hemorrhoid   . Colon polyps     Past Surgical History  Procedure Date  . Mastectomy     Bilateral 2001  . Abdominal hysterectomy   . Cholecystectomy   . Right knee replacement   . Appendectomy   . Total shoulder replacement   . Breast surgery     Family History  Problem Relation Age of Onset  . Cancer Father   . Hypertension Mother   . Heart disease Father     MI at age 63  . Diabetes Mother   . Hypertension Paternal Grandfather   . Heart disease Paternal Grandfather   . Coronary  artery disease Maternal Grandfather   . Coronary artery disease Maternal Grandmother   . Breast cancer Sister   . Breast cancer Sister   . Breast cancer Sister    Social History:  reports that she has been smoking Cigarettes.  She has a 21 pack-year smoking history. She does not have any smokeless tobacco history on file. She reports that she drinks alcohol. She reports that she does not use illicit drugs.  Allergies:  Allergies  Allergen Reactions  . Ivp Dye (Iodinated Diagnostic Agents) Hives and Shortness Of Breath  . Naproxen Sodium Hives    Medications Prior to Admission  Medication Sig Dispense Refill  . albuterol (PROVENTIL HFA;VENTOLIN HFA) 108 (90 BASE) MCG/ACT inhaler Inhale 2 puffs into the lungs every 6 (six) hours as needed. For shortness of breath and wheezing       . Bisacodyl (DULCOLAX PO) Take 1 tablet by mouth every 30 (thirty) days. For constipation      . celecoxib (CELEBREX) 100 MG capsule Take 100 mg by mouth 2 (two) times daily.        . cyanocobalamin 500 MCG tablet Take 1,000 mcg by mouth daily.       . ferrous sulfate 325 (65 FE) MG tablet Take 325 mg by mouth daily with breakfast.      . FLUoxetine (PROZAC) 20 MG capsule Take 20 mg by mouth daily.        . fluticasone (FLONASE)  50 MCG/ACT nasal spray Place 1 spray into both nostrils daily.        Marland Kitchen gabapentin (NEURONTIN) 300 MG capsule 600 mg daily as needed. Two tabs at night      . Glucosamine-Chondroitin (COSAMIN DS PO) Take 1 tablet by mouth daily.        . Melatonin 3 MG TABS Take 1 tablet by mouth at bedtime as needed.       . Multiple Vitamin (MULITIVITAMIN WITH MINERALS) TABS Take 1 tablet by mouth daily.      Marland Kitchen oxycodone (OXYCONTIN) 30 MG TB12 Take 30 mg by mouth 3 (three) times daily.        Marland Kitchen oxyCODONE (ROXICODONE) 15 MG immediate release tablet Take 15 mg by mouth 4 (four) times daily.       . polyethylene glycol powder (GLYCOLAX/MIRALAX) powder Take 17 g by mouth daily as needed. constipation        . Trolamine Salicylate (ASPERCREME) 10 % LOTN Apply topically as needed. For pain        . VITAMIN D, CHOLECALCIFEROL, PO Take 2,500 mg by mouth daily.        Marland Kitchen amoxicillin (AMOXIL) 500 MG capsule Take 500 mg by mouth 3 (three) times daily. For 7 days        Results for orders placed during the hospital encounter of 06/21/11 (from the past 48 hour(s))  TROPONIN I     Status: Normal   Collection Time   06/21/11 11:26 PM      Component Value Range Comment   Troponin I <0.30  <0.30 (ng/mL)   D-DIMER, QUANTITATIVE     Status: Abnormal   Collection Time   06/21/11 11:26 PM      Component Value Range Comment   D-Dimer, Quant 1.66 (*) 0.00 - 0.48 (ug/mL-FEU)   CBC     Status: Abnormal   Collection Time   06/21/11 11:32 PM      Component Value Range Comment   WBC 20.8 (*) 4.0 - 10.5 (K/uL)    RBC 3.97  3.87 - 5.11 (MIL/uL)    Hemoglobin 11.9 (*) 12.0 - 15.0 (g/dL)    HCT 96.0 (*) 45.4 - 46.0 (%)    MCV 86.6  78.0 - 100.0 (fL)    MCH 30.0  26.0 - 34.0 (pg)    MCHC 34.6  30.0 - 36.0 (g/dL)    RDW 09.8  11.9 - 14.7 (%)    Platelets 241  150 - 400 (K/uL)   DIFFERENTIAL     Status: Abnormal   Collection Time   06/21/11 11:32 PM      Component Value Range Comment   Neutrophils Relative 85 (*) 43 - 77 (%)    Neutro Abs 17.7 (*) 1.7 - 7.7 (K/uL)    Lymphocytes Relative 8 (*) 12 - 46 (%)    Lymphs Abs 1.6  0.7 - 4.0 (K/uL)    Monocytes Relative 6  3 - 12 (%)    Monocytes Absolute 1.3 (*) 0.1 - 1.0 (K/uL)    Eosinophils Relative 1  0 - 5 (%)    Eosinophils Absolute 0.2  0.0 - 0.7 (K/uL)    Basophils Relative 0  0 - 1 (%)    Basophils Absolute 0.0  0.0 - 0.1 (K/uL)   COMPREHENSIVE METABOLIC PANEL     Status: Abnormal   Collection Time   06/21/11 11:32 PM      Component Value Range Comment   Sodium 133 (*) 135 -  145 (mEq/L)    Potassium 3.6  3.5 - 5.1 (mEq/L)    Chloride 98  96 - 112 (mEq/L)    CO2 26  19 - 32 (mEq/L)    Glucose, Bld 137 (*) 70 - 99 (mg/dL)    BUN 13  6 - 23 (mg/dL)     Creatinine, Ser 4.09  0.50 - 1.10 (mg/dL)    Calcium 9.1  8.4 - 10.5 (mg/dL)    Total Protein 7.3  6.0 - 8.3 (g/dL)    Albumin 3.2 (*) 3.5 - 5.2 (g/dL)    AST 15  0 - 37 (U/L)    ALT 9  0 - 35 (U/L)    Alkaline Phosphatase 113  39 - 117 (U/L)    Total Bilirubin 0.4  0.3 - 1.2 (mg/dL)    GFR calc non Af Amer >90  >90 (mL/min)    GFR calc Af Amer >90  >90 (mL/min)   PRO B NATRIURETIC PEPTIDE     Status: Abnormal   Collection Time   06/21/11 11:32 PM      Component Value Range Comment   Pro B Natriuretic peptide (BNP) 394.1 (*) 0 - 125 (pg/mL)   POCT I-STAT 3, BLOOD GAS (G3+)     Status: Abnormal   Collection Time   06/22/11  4:37 AM      Component Value Range Comment   pH, Arterial 7.345 (*) 7.350 - 7.400     pCO2 arterial 46.3 (*) 35.0 - 45.0 (mmHg)    pO2, Arterial 77.0 (*) 80.0 - 100.0 (mmHg)    Bicarbonate 25.3 (*) 20.0 - 24.0 (mEq/L)    TCO2 27  0 - 100 (mmol/L)    O2 Saturation 95.0      Acid-base deficit 1.0  0.0 - 2.0 (mmol/L)    Patient temperature 98.1 F      Collection site RADIAL, Brophy'S TEST ACCEPTABLE      Drawn by RT      Sample type ARTERIAL      Dg Chest 2 View  06/22/2011  *RADIOLOGY REPORT*  Clinical Data: Shortness of breath and cough.  CHEST - 2 VIEW  Comparison: Chest radiograph performed 01/19/2011  Findings: The lungs are well-aerated.  Mild bibasilar airspace opacities may reflect mild pneumonia.  There is no evidence of pleural effusion or pneumothorax.  The heart is normal in size; the mediastinal contour is within normal limits.  No acute osseous abnormalities are seen.  Pins are noted at the left humeral head.  IMPRESSION: Mild bibasilar airspace opacities may reflect mild pneumonia.  Original Report Authenticated By: Tonia Ghent, M.D.   Ct Angio Chest W/cm &/or Wo Cm  06/22/2011  *RADIOLOGY REPORT*  Clinical Data: Shortness of breath and cough.  CT ANGIOGRAPHY CHEST  Technique:  Multidetector CT imaging of the chest using the standard protocol during bolus  administration of intravenous contrast. Multiplanar reconstructed images including MIPs were obtained and reviewed to evaluate the vascular anatomy.  Contrast: 80mL OMNIPAQUE IOHEXOL 350 MG/ML SOLN  Comparison: Chest radiograph performed 06/21/2011, and CTA of the chest performed 08/24/2009  Findings: There is no evidence of significant pulmonary embolus. Evaluation for pulmonary embolus is suboptimal in areas of airspace consolidation.  Patchy airspace opacification is noted at both lung bases, compatible with pneumonia.  Additional ground-glass airspace opacification is seen throughout the expanded portions of both lungs.  There is no evidence of pleural effusion or pneumothorax. No masses are identified; no abnormal focal contrast enhancement is seen.  Scattered  hilar and mediastinal nodes are borderline normal in size, aside from a mildly prominent 1.1 cm right hilar node.  No pericardial effusion is identified.  There is mild luminal narrowing along the left subclavian artery due to intramural thrombus.  No axillary lymphadenopathy is seen.  The visualized portions of the thyroid gland are unremarkable in appearance.  Mild apparent wall thickening along the distal esophagus may reflect mild esophagitis.  The visualized portions of the liver and spleen are unremarkable.  Bilateral breast implants are noted.  No acute osseous abnormalities are seen.  Screws are noted at the left humeral head.  IMPRESSION:  1.  No evidence of significant pulmonary embolus. 2.  Bibasilar pneumonia noted; additional ground-glass airspace opacification throughout the expanded portions of both lungs, also compatible with pneumonia. 3.  Borderline prominent right hilar node. 4.  Mild apparent wall thickening along the distal esophagus may reflect mild esophagitis. 5.  Mild luminal narrowing along the left subclavian artery due to a small amount of intramural thrombus.  Original Report Authenticated By: Tonia Ghent, M.D.    Review  of Systems  Constitutional: Positive for fever and chills.  HENT: Negative.   Eyes: Negative.   Respiratory: Positive for cough and shortness of breath.   Cardiovascular: Negative.   Gastrointestinal: Negative.   Genitourinary: Negative.   Musculoskeletal: Negative.   Skin: Negative.   Neurological: Negative.   Endo/Heme/Allergies: Negative.   Psychiatric/Behavioral: Negative.     Blood pressure 102/64, pulse 84, temperature 98.4 F (36.9 C), temperature source Axillary, resp. rate 24, height 5' 2.5" (1.588 m), weight 68.1 kg (150 lb 2.1 oz), SpO2 96.00%. Physical Exam  Constitutional: She is oriented to person, place, and time. She appears well-developed and well-nourished. No distress.  HENT:  Head: Normocephalic and atraumatic.  Right Ear: External ear normal.  Left Ear: External ear normal.  Mouth/Throat: No oropharyngeal exudate.  Eyes: Conjunctivae are normal. Pupils are equal, round, and reactive to light. Right eye exhibits no discharge. Left eye exhibits no discharge. No scleral icterus.  Neck: Normal range of motion. Neck supple.  Cardiovascular: Normal rate and regular rhythm.   Respiratory: Effort normal and breath sounds normal. No respiratory distress. She has no wheezes. She has no rales.  GI: Soft. Bowel sounds are normal. She exhibits no distension. There is no tenderness. There is no rebound.  Musculoskeletal: Normal range of motion. She exhibits no edema and no tenderness.  Neurological: She is alert and oriented to person, place, and time.       Moves all extremities.  Skin: Skin is warm and dry. No rash noted. She is not diaphoretic. No erythema.     Assessment/Plan #1. Multifocal pneumonia - at this time I have discussed with critical care specialist Dr. Kendrick Fries. He is advised to continue with antibiotics and to consult them if there is any worsening of her symptoms. Patient at this time is on 40% Ventimask but able to talk complete sentences without any  difficulty. We will continue with ceftriaxone and Zithromax and check urine Legionella and strep antigen. Recheck her basic labs again. #2. Chronic pain secondary to degenerative disc disease  - continue present medications. #3. Normocytic and normochromic anemia - reviewing her chart patient did have anemia back in December in 2012. Closely follow CBC. #4. History of breast cancer status post mastectomy and chemotherapy in 2001. #5. Ongoing tobacco abuse - advised patient to quit smoking. #6. Leukocytosis from #1 reason.  CODE STATUS - full code.   Lindsey Rose N.  06/22/2011, 6:43 AM

## 2011-06-22 NOTE — Evaluation (Addendum)
Physical Therapy Evaluation Patient Details Name: Uma Jerde MRN: [id number] DOB: 1950-03-14 Today's Date: 06/22/2011 Time: 1191-4782 PT Time Calculation (min): 20 min  PT Assessment / Plan / Recommendation Clinical Impression  Mrs. Caporaso was admitted with PNA. She is at her baseline mobility with the exception of being short of breath. No further PT needs identified at this time. Recommend pt ambulate daily with nursing staff to prevent further weakness from immobility. She is aware of this recommendation.  She may need O2 initially.     PT Assessment  Patent does not need any further PT services    Follow Up Recommendations  No PT follow up    Equipment Recommendations  None recommended by PT    Frequency      Precautions / Restrictions Precautions Precautions: None   Vitals: Pt resting in her room on RA initially upon my entrance at 91%. She needed to use the restroom so got up and went to the bathroom but obviously short of breath during this activity. Checked O2 sats when she returned to her chair and they had dropped to 82-85%. Cued pt for pursed lip breathing and returned her venturi mask to her face and O2 returned to 98%. RN made aware that they will need to ambulate her to determine how much her sats actually drop during gait but she should be fine to ambulate without assist at this point.       Mobility  Bed Mobility Bed Mobility: Supine to Sit;Sitting - Scoot to Edge of Bed Supine to Sit: 7: Independent Sitting - Scoot to [place] of Bed: 7: Independent Details for Bed Mobility Assistance: pt sat right up into long sitting independently and pivoted to EOB with no difficulty other than SOB Transfers Transfers: Sit to Stand;Stand to Sit Sit to Stand: 7: Independent;From bed;From toilet;Without upper extremity assist Stand to Sit: 7: Independent;To toilet;Without upper extremity assist;To chair/3-in-1 Details for Transfer Assistance: stood from low surfaces with no upper  extremity assist and no difficulties Ambulation/Gait Ambulation/Gait Assistance: 7: Independent Ambulation Distance (Feet): 20 Feet Assistive device: None Ambulation/Gait Assistance Details: amb with slower gait speed but only because she is short of breath, able to manuever tight spaces with ease, direction changes with ease  Gait Pattern: Within Functional Limits Stairs: No (pt out of breath following toileting so did not test)    Exercises     PT Goals    Visit Information  Last PT Received On: 06/22/11 Assistance Needed: +1    Subjective Data  Subjective: I feel alright   Prior Functioning  Home Living Lives With: Spouse Available Help at Discharge: Family Type of Home: House Home Access: Stairs to enter Home Layout: Two level Alternate Level Stairs-Number of Steps: 1 flight Home Adaptive Equipment: None Prior Function Level of Independence: Independent Able to Take Stairs?: Reciprically Driving: Yes Vocation: Retired Musician: No difficulties    Cognition  Overall Cognitive Status: Appears within functional limits for tasks assessed/performed Arousal/Alertness: Awake/alert Orientation Level: Appears intact for tasks assessed Behavior During Session: [facility] for tasks performed    Extremity/Trunk Assessment Right Upper Extremity Assessment RUE ROM/Strength/Tone: Within functional levels RUE Sensation: WFL - Light Touch;WFL - Proprioception RUE Coordination: WFL - gross/fine motor Left Upper Extremity Assessment LUE ROM/Strength/Tone: Within functional levels LUE Sensation: WFL - Light Touch;WFL - Proprioception LUE Coordination: WFL - gross/fine motor Right Lower Extremity Assessment RLE ROM/Strength/Tone: Within functional levels RLE Sensation: WFL - Light Touch;WFL - Proprioception RLE Coordination: WFL - gross/fine motor Left Lower  Extremity Assessment LLE ROM/Strength/Tone: Within functional levels LLE Sensation: WFL - Light Touch;WFL -  Proprioception LLE Coordination: WFL - gross/fine motor Trunk Assessment Trunk Assessment: Normal   Balance Balance Balance Assessed: Yes Dynamic Standing Balance Dynamic Standing - Balance Support: No upper extremity supported Dynamic Standing - Level of Assistance: 7: Independent Dynamic Standing - Comments: pt performed pericare independently demonstrating ability to weight shift and rotate trunk in a partial squat position, no LOB balance noted no difficulties identified High Level Balance High Level Balance Activites: Turns;Direction changes High Level Balance Comments: pt able to turn with ease for sitting in chair and on toilet  End of Session PT - End of Session Equipment Utilized During Treatment: Gait belt Activity Tolerance: Patient tolerated treatment well;Patient limited by fatigue Patient left: in chair;with call bell/phone within reach;with nursing in room Nurse Communication: Mobility status   [facility] HELEN 06/22/2011, 9:25 AM

## 2011-06-23 DIAGNOSIS — J9601 Acute respiratory failure with hypoxia: Secondary | ICD-10-CM

## 2011-06-23 DIAGNOSIS — J449 Chronic obstructive pulmonary disease, unspecified: Secondary | ICD-10-CM | POA: Diagnosis present

## 2011-06-23 DIAGNOSIS — J9600 Acute respiratory failure, unspecified whether with hypoxia or hypercapnia: Secondary | ICD-10-CM

## 2011-06-23 DIAGNOSIS — F17200 Nicotine dependence, unspecified, uncomplicated: Secondary | ICD-10-CM

## 2011-06-23 DIAGNOSIS — D696 Thrombocytopenia, unspecified: Secondary | ICD-10-CM

## 2011-06-23 DIAGNOSIS — D7289 Other specified disorders of white blood cells: Secondary | ICD-10-CM

## 2011-06-23 LAB — CBC
Hemoglobin: 10.4 g/dL — ABNORMAL LOW (ref 12.0–15.0)
Platelets: 205 10*3/uL (ref 150–400)
RBC: 3.55 MIL/uL — ABNORMAL LOW (ref 3.87–5.11)
WBC: 17.1 10*3/uL — ABNORMAL HIGH (ref 4.0–10.5)

## 2011-06-23 LAB — LEGIONELLA ANTIGEN, URINE

## 2011-06-23 LAB — BASIC METABOLIC PANEL
CO2: 24 mEq/L (ref 19–32)
Calcium: 8.6 mg/dL (ref 8.4–10.5)
Creatinine, Ser: 0.43 mg/dL — ABNORMAL LOW (ref 0.50–1.10)
GFR calc Af Amer: >90 mL/min (ref >90)
Glucose, Bld: 110 mg/dL — ABNORMAL HIGH (ref 70–99)
Potassium: 3.2 mEq/L — ABNORMAL LOW (ref 3.5–5.1)
Sodium: 140 mEq/L (ref 135–145)

## 2011-06-23 LAB — PRO B NATRIURETIC PEPTIDE: Pro B Natriuretic peptide (BNP): 1288 pg/mL — ABNORMAL HIGH (ref 0–125)

## 2011-06-23 MED ORDER — DIPHENHYDRAMINE HCL 25 MG PO CAPS
25.0000 mg | ORAL_CAPSULE | Freq: Every evening | ORAL | Status: DC | PRN
  Administered 2011-06-23: 25 mg via ORAL
  Filled 2011-06-23: qty 1

## 2011-06-23 MED ORDER — IPRATROPIUM BROMIDE 0.02 % IN SOLN: 0.5000 mg | Freq: Four times a day (QID) | RESPIRATORY_TRACT | Status: DC | PRN

## 2011-06-23 MED ORDER — LEVALBUTEROL HCL 0.63 MG/3ML IN NEBU
0.6300 mg | INHALATION_SOLUTION | Freq: Four times a day (QID) | RESPIRATORY_TRACT | Status: DC
  Administered 2011-06-23: 0.63 mg via RESPIRATORY_TRACT
  Filled 2011-06-23 (×5): qty 3

## 2011-06-23 MED ORDER — LEVALBUTEROL HCL 0.63 MG/3ML IN NEBU
0.6300 mg | INHALATION_SOLUTION | Freq: Three times a day (TID) | RESPIRATORY_TRACT | Status: DC
  Filled 2011-06-23 (×3): qty 3

## 2011-06-23 MED ORDER — LEVOFLOXACIN 750 MG PO TABS: 750.0000 mg | ORAL_TABLET | Freq: Every day | ORAL | Status: AC

## 2011-06-23 MED ORDER — TIOTROPIUM BROMIDE MONOHYDRATE 18 MCG IN CAPS: 18.0000 ug | ORAL_CAPSULE | Freq: Every day | RESPIRATORY_TRACT | Status: DC

## 2011-06-23 MED ORDER — LEVALBUTEROL HCL 0.63 MG/3ML IN NEBU
0.6300 mg | INHALATION_SOLUTION | Freq: Four times a day (QID) | RESPIRATORY_TRACT | Status: DC | PRN
Start: 2011-06-23 — End: 2011-06-24
  Filled 2011-06-23: qty 3

## 2011-06-23 MED ORDER — LORAZEPAM 1 MG PO TABS
1.0000 mg | ORAL_TABLET | Freq: Two times a day (BID) | ORAL | Status: DC | PRN
  Filled 2011-06-23: qty 1

## 2011-06-23 MED ORDER — TIOTROPIUM BROMIDE MONOHYDRATE 18 MCG IN CAPS
18.0000 ug | ORAL_CAPSULE | Freq: Every day | RESPIRATORY_TRACT | Status: DC
  Administered 2011-06-24: 18 ug via RESPIRATORY_TRACT
  Filled 2011-06-23 (×2): qty 5

## 2011-06-23 MED ORDER — POTASSIUM CHLORIDE CRYS ER 20 MEQ PO TBCR
40.0000 meq | EXTENDED_RELEASE_TABLET | Freq: Two times a day (BID) | ORAL | Status: AC
  Administered 2011-06-23 (×2): 40 meq via ORAL
  Filled 2011-06-23 (×2): qty 2

## 2011-06-23 MED ORDER — GUAIFENESIN ER 600 MG PO TB12: 1200.0000 mg | ORAL_TABLET | Freq: Two times a day (BID) | ORAL | Status: AC

## 2011-06-23 NOTE — Discharge Instructions (Addendum)
It is very important to stop smoking or you will continue to have worsening lung problems..  Consider doing it now as you have already been off of cigarettes for several days.

## 2011-06-23 NOTE — Progress Notes (Signed)
Ambulated patient in hall on room air.  Before ambulation pulse ox was at 96% r/a.  During ambulation pulse ox ranged from 93-95%.  Patient tolerated ambulation well.

## 2011-06-23 NOTE — Progress Notes (Signed)
Patient ID: Lindsey Rose, female   DOB: 15-Apr-1950, 61 y.o.   MRN: [id number]  PATIENT DETAILS Name: Lindsey Rose Age: 61 y.o. Sex: female Date of Birth: 1950-09-09 Admit Date: 06/21/2011 PCP:  Dalbert Mayotte, MD, MD    Interim History: H/O breast cancer, s/p double mastectomy and chemotherapy.  H/O mild COPD on occassional albuterol inhaler.  SOB x 2 weeks.  Failed outpt treatment with 7 day course of Augmentin.  Continues to smoke.  Subjective: Feels "Jacked up", like she's about to come out of her skin.  Complaining about painful lovenox shots in her abdomen.  Objective: Weight change:   Intake/Output Summary (Last 24 hours) at 06/23/11 1030 Last data filed at 06/23/11 0647  Gross per 24 hour  Intake   2070 ml  Output    150 ml  Net   1920 ml   Blood pressure 118/70, pulse 88, temperature 97.9 F (36.6 C), temperature source Oral, resp. rate 16, height 5' 2.5" (1.588 m), weight 68.1 kg (150 lb 2.1 oz), SpO2 95.00%. Filed Vitals:   06/22/11 2146 06/23/11 0212 06/23/11 0418 06/23/11 0805  BP:   118/70   Pulse:   88   Temp:   97.9 F (36.6 C)   TempSrc:   Oral   Resp:   16   Height:      Weight:      SpO2: 96% 97% 94% 95%    Physical Exam: General: Anxious, tearful, Awake, alert, sitting in chair, face and chest are flushed pink, hands are fidgety Lungs: CTA,  no accessory muscle movement. Cardiovascular: Tachycardic without murmur gallop or rub normal S1 and S2 Abdomen: Nontender, nondistended, soft, bowel sounds positive, no rebound, no ascites, no appreciable mass Extremities: No significant cyanosis, clubbing, or edema bilateral lower extremities Continuous Pulse Ox:  Desaturated to 78% this morning at 8:30 am.  Basic Metabolic Panel:  Lab 06/23/11 4098 06/22/11 0710  NA 140 134*  K 3.2* 4.1  CL 105 102  CO2 24 22  GLUCOSE 110* 156*  BUN 8 9  CREATININE 0.43* 0.48*  CALCIUM 8.6 9.0  MG -- --  PHOS -- --   Liver Function Tests:  Lab 06/22/11 0710  06/21/11 2332  AST 16 15  ALT 9 9  ALKPHOS 117 113  BILITOT 0.2* 0.4  PROT 7.0 7.3  ALBUMIN 2.8* 3.2*   CBC:  Lab 06/23/11 0625 06/22/11 0710 06/21/11 2332  WBC 17.1* 19.2* --  NEUTROABS -- 18.0* 17.7*  HGB 10.4* 11.4* --  HCT 31.0* 34.7* --  MCV 87.3 88.3 --  PLT 205 234 --   Cardiac Enzymes:  Lab 06/21/11 2326  CKTOTAL --  CKMB --  CKMBINDEX --  TROPONINI <0.30   BNP: 1288 on 06/23/2011  D-Dimer:  Lab 06/21/11 2326  DDIMER 1.66*    Studies/Results: Scheduled Meds:    . azithromycin  500 mg Intravenous Q24H  . cefTRIAXone (ROCEPHIN)  IV  1 g Intravenous Q24H  . ferrous sulfate  325 mg Oral Q breakfast  . FLUoxetine  20 mg Oral Daily  . fluticasone  1 spray Each Nare Daily  . guaiFENesin  1,200 mg Oral BID  . oxycodone  30 mg Oral TID  . potassium chloride  40 mEq Oral BID  . sodium chloride  3 mL Intravenous Q12H  . cyanocobalamin  1,000 mcg Oral Daily  . DISCONTD: albuterol  2.5 mg Nebulization Q6H  . DISCONTD: enoxaparin  40 mg Subcutaneous Q24H  . DISCONTD: ipratropium  0.5 mg  Nebulization QID  . DISCONTD: ipratropium  0.5 mg Nebulization Q6H  . DISCONTD: levalbuterol  0.63 mg Nebulization Q6H  . DISCONTD: levalbuterol  0.63 mg Nebulization Q8H   Continuous Infusions:    . DISCONTD: sodium chloride 75 mL/hr at 06/22/11 0648  . DISCONTD: sodium chloride 50 mL/hr at 06/23/11 0600   PRN Meds:.acetaminophen, acetaminophen, diphenhydrAMINE, gabapentin, ipratropium, levalbuterol, LORazepam, ondansetron (ZOFRAN) IV, ondansetron, oxyCODONE, polyethylene glycol powder, DISCONTD: albuterol  Anti-infectives:  Anti-infectives     Start     Dose/Rate Route Frequency Ordered Stop   06/22/11 0700   cefTRIAXone (ROCEPHIN) 1 g in dextrose 5 % 50 mL IVPB        1 g 100 mL/hr over 30 Minutes Intravenous Every 24 hours 06/22/11 0635     06/22/11 0700   azithromycin (ZITHROMAX) 500 mg in dextrose 5 % 250 mL IVPB        500 mg 250 mL/hr over 60 Minutes Intravenous  Every 24 hours 06/22/11 0635     06/22/11 0100   cefTRIAXone (ROCEPHIN) 1 g in dextrose 5 % 50 mL IVPB        1 g 100 mL/hr over 30 Minutes Intravenous  Once 06/22/11 0055 06/22/11 0146   06/22/11 0100   azithromycin (ZITHROMAX) 500 mg in dextrose 5 % 250 mL IVPB        500 mg 250 mL/hr over 60 Minutes Intravenous  Once 06/22/11 0055 06/22/11 0407          Assessment/Plan:  #1.  CAP, failed outpatient Augmentin.  On Zithromax and Rocephin. Urine legionella and strep antigen pending. Breathing improved however still hypoxic at times.  Wants D/C in am 5/10.  Will order ambulating oxygen sats as patient may need to go home on oxygen.Blood cultures-5/8 neg to date. Leukocytosis downtrending as well.  #2. Acute hypoxic respiratory failure-secondary to above, CT angioplasty chest negative for pulmonary embolism.  On admission was requiring 40 %FIO2 via a venti mask-currently down to just 2 liters. Now with significant improvement  #3.  Chronic pain secondary to degenerative disc disease - continue present medications.   #4. Normocytic and normochromic anemia - reviewing her chart patient did have anemia back in December in 2012. Closely follow CBC. In the interim continue with iron supplementation and vitamin B12 supplementation  #5. History of breast cancer status post mastectomy and chemotherapy in 2001.   #6. Ongoing tobacco abuse - advised patient to quit smoking.  Currently continues to smoke.  States she will try to quit very slowly.  #7. Leukocytosis from #1 reason.  #8. COPD/chronic bronchitis-will continue with PRN Atrovent, patient may need to be discharged on Spiriva.  #9. Depression-continue with fluoxetine   #10  Elevated BNP - Echo 12/12 shows EF of 60 - 65% no wall motion abnormalities.  IVF D/C'd  DVT Prophylaxis: lovenox  Disposition:  Home when medically ready. Patient requests discharge no later than 06/24/11.  CODE STATUS: Full code   LOS: 2 days   Stephani Police 06/23/2011, 10:30 AM [phone]  Attending - I have seen and examined the patient, I agree with the above documentation. She is significantly better, she wanted to go home today, but I requested her to stay one more day and she agreed. If she continues to do well, I will discharge her in the am.

## 2011-06-23 NOTE — Discharge Summary (Signed)
Patient ID: Marcianne Ozbun MRN: [id number] DOB/AGE: [birth year] 61 y.o.  Admit date: 06/21/2011 Discharge date: 06/24/2011  Primary Care Physician:  Dalbert Mayotte, MD, MD  Discharge Diagnoses:   Present on Admission: Principal Problem:  *Community acquired pneumonia with acute hypoxic respiratory failure. Active Problems:  Degenerative disc disease, lumbar  Anxiety and depression  History of breast cancer in female  COPD (chronic obstructive pulmonary disease)  Acute respiratory failure with hypoxia   Medication List  As of 06/24/2011  8:16 AM   STOP taking these medications         amoxicillin 500 MG capsule         TAKE these medications         albuterol 108 (90 BASE) MCG/ACT inhaler   Commonly known as: PROVENTIL HFA;VENTOLIN HFA   Inhale 2 puffs into the lungs every 6 (six) hours as needed. For shortness of breath and wheezing      ASPERCREME 10 % Lotn   Generic drug: Trolamine Salicylate   Apply topically as needed. For pain        celecoxib 100 MG capsule   Commonly known as: CELEBREX   Take 100 mg by mouth 2 (two) times daily.      COSAMIN DS PO   Take 1 tablet by mouth daily.      cyanocobalamin 500 MCG tablet   Take 1,000 mcg by mouth daily.      DULCOLAX PO   Take 1 tablet by mouth every 30 (thirty) days. For constipation      ferrous sulfate 325 (65 FE) MG tablet   Take 325 mg by mouth daily with breakfast.      FLUoxetine 20 MG capsule   Commonly known as: PROZAC   Take 20 mg by mouth daily.      fluticasone 50 MCG/ACT nasal spray   Commonly known as: FLONASE   Place 1 spray into both nostrils daily.      gabapentin 300 MG capsule   Commonly known as: NEURONTIN   600 mg daily as needed. Two tabs at night      guaiFENesin 600 MG 12 hr tablet   Commonly known as: MUCINEX   Take 2 tablets (1,200 mg total) by mouth 2 (two) times daily.      levofloxacin 750 MG tablet   Commonly known as: LEVAQUIN   Take 1 tablet (750 mg total) by mouth daily.       Melatonin 3 MG Tabs   Take 1 tablet by mouth at bedtime as needed.      mulitivitamin with minerals Tabs   Take 1 tablet by mouth daily.      oxycodone 30 MG Tb12   Commonly known as: OXYCONTIN   Take 30 mg by mouth 3 (three) times daily.      oxyCODONE 15 MG immediate release tablet   Commonly known as: ROXICODONE   Take 15 mg by mouth 4 (four) times daily.      polyethylene glycol powder powder   Commonly known as: GLYCOLAX/MIRALAX   Take 17 g by mouth daily as needed. constipation      tiotropium 18 MCG inhalation capsule   Commonly known as: SPIRIVA   Place 1 capsule (18 mcg total) into inhaler and inhale daily.      VITAMIN D (CHOLECALCIFEROL) PO   Take 2,500 mg by mouth daily.            Brief H and P: From the admission note:  61 year-old  female with known history of breast cancer status post mastectomy and chemotherapy in 2001, chronic pain secondary to degenerative disc disease and ongoing tobacco abuse has been experiencing cough, shortness of breath with subjective feeling of fever and chills over the last 2 weeks. Initially she went to an urgent care where she was given amoxicillin and she took it for a week. Despite taking this, the patient was still short of breath with cough nonproductive and subjective feeling of fever chills which has been progressively worsening and patient decided to come to the ER at [facility] [place]. Patient had CT angiogram of the chest done which shows bilateral multifocal pneumonia. In addition patient was found to have leukocytosis and has been started on ceftriaxone and Zithromax for community-acquired pneumonia. Patient gets chest pain only on coughing otherwise denies any nausea vomiting abdominal pain dizziness or loss of consciousness or diarrhea.  1.  Community Acquired Pneumonia with Acute Hypoxic Respiratory Failure.  Ms. Crafton CT Angio Chest was negative for PE.  On the first day of her hospitalization Ms. Bevill was  significantly hypoxic and required a 40% ventimask to maintain her oxygen saturation at 90+.  Over the next several hours she was able to wake up and transitioned to oxygen via nasal canula.  She became alert and reported she felt better.  Blood cultures show no growth to date, but are still pending.  Urine legionella antigen was negative, as well Strep Pneumo urinary antigen. Rocephin and Azithromycin were continued thru out her 3 day hospitalization.  She was then transitioned to Levaquin to finish her antibiotic course as an outpatient.  2.  COPD - after collecting further history it was determined that Ms. Balin has underlying COPD.  She was treated with nebulizers and improved.  She will be discharged on Spiriva to improve her breathing status.  3.  On-going tobacco use.  Ms. Cale was counseled to quit smoking.  She's had both recent death and illness in the family and feels she is unable to quit this time.  However, the patient was still encouraged to quit.  4.. Chronic pain. secondary to degenerative disc disease - continue home medications. Consider slowly lowering the dosage of heavy narcotics.   5. Normocytic and normochromic anemia. The patient did have anemia back in December in 2012. Closely follow CBC. In the interim we are continuing with iron supplementation and vitamin B12 supplementation.  6. Depression continued with fluoxetine.  7.  Elevated BNP - Echo 12/12 shows EF of 60 - 65% no wall motion abnormalities. Patient is not fluid overloaded and does not show signs of CHF.   We would request that her primary care physician be alert to any future signs of possible CHF.  8. History of breast cancer status post mastectomy and chemotherapy in 2001.   9.  Hypokalemia - repleted orally.  Physical Exam on Discharge: General: Alert, awake, oriented x3, in no acute distress. HEENT: No bruits, no goiter. Heart: Regular rate and rhythm, without murmurs, rubs, gallops. Lungs: Clear to  auscultation bilaterally. Abdomen: Soft, nontender, nondistended, positive bowel sounds. Extremities: No clubbing cyanosis or edema with positive pedal pulses. Neuro: Grossly intact, nonfocal.  Filed Vitals:   06/23/11 2227 06/24/11 0438 06/24/11 0700 06/24/11 0745  BP: 129/80 137/84    Pulse: 90 77    Temp: 98.9 F (37.2 C) 98.7 F (37.1 C)    TempSrc: Oral Oral    Resp: 20 20    Height:  Weight:   69.6 kg (153 lb 7 oz)   SpO2: 95% 92%  93%     Intake/Output Summary (Last 24 hours) at 06/24/11 0816 Last data filed at 06/23/11 1300  Gross per 24 hour  Intake    340 ml  Output      0 ml  Net    340 ml    Basic Metabolic Panel:  Lab 06/23/11 4098 06/22/11 0710  NA 140 134*  K 3.2* 4.1  CL 105 102  CO2 24 22  GLUCOSE 110* 156*  BUN 8 9  CREATININE 0.43* 0.48*  CALCIUM 8.6 9.0  MG -- --  PHOS -- --   Liver Function Tests:  Lab 06/22/11 0710 06/21/11 2332  AST 16 15  ALT 9 9  ALKPHOS 117 113  BILITOT 0.2* 0.4  PROT 7.0 7.3  ALBUMIN 2.8* 3.2*   CBC:  Lab 06/23/11 0625 06/22/11 0710 06/21/11 2332  WBC 17.1* 19.2* --  NEUTROABS -- 18.0* 17.7*  HGB 10.4* 11.4* --  HCT 31.0* 34.7* --  MCV 87.3 88.3 --  PLT 205 234 --   Cardiac Enzymes:  Lab 06/21/11 2326  CKTOTAL --  CKMB --  CKMBINDEX --  TROPONINI <0.30   D-Dimer:  Lab 06/21/11 2326  DDIMER 1.66*   Significant Diagnostic Studies:  Dg Chest 2 View  06/22/2011  *RADIOLOGY REPORT*  Clinical Data: Shortness of breath and cough.  CHEST - 2 VIEW  Comparison: Chest radiograph performed 01/19/2011  Findings: The lungs are well-aerated.  Mild bibasilar airspace opacities may reflect mild pneumonia.  There is no evidence of pleural effusion or pneumothorax.  The heart is normal in size; the mediastinal contour is within normal limits.  No acute osseous abnormalities are seen.  Pins are noted at the left humeral head.  IMPRESSION: Mild bibasilar airspace opacities may reflect mild pneumonia.  Original  Report Authenticated By: Tonia Ghent, M.D.   Ct Angio Chest W/cm &/or Wo Cm  06/22/2011  *RADIOLOGY REPORT*  Clinical Data: Shortness of breath and cough.  CT ANGIOGRAPHY CHEST  Technique:  Multidetector CT imaging of the chest using the standard protocol during bolus administration of intravenous contrast. Multiplanar reconstructed images including MIPs were obtained and reviewed to evaluate the vascular anatomy.  Contrast: 80mL OMNIPAQUE IOHEXOL 350 MG/ML SOLN  Comparison: Chest radiograph performed 06/21/2011, and CTA of the chest performed 08/24/2009  Findings: There is no evidence of significant pulmonary embolus. Evaluation for pulmonary embolus is suboptimal in areas of airspace consolidation.  Patchy airspace opacification is noted at both lung bases, compatible with pneumonia.  Additional ground-glass airspace opacification is seen throughout the expanded portions of both lungs.  There is no evidence of pleural effusion or pneumothorax. No masses are identified; no abnormal focal contrast enhancement is seen.  Scattered hilar and mediastinal nodes are borderline normal in size, aside from a mildly prominent 1.1 cm right hilar node.  No pericardial effusion is identified.  There is mild luminal narrowing along the left subclavian artery due to intramural thrombus.  No axillary lymphadenopathy is seen.  The visualized portions of the thyroid gland are unremarkable in appearance.  Mild apparent wall thickening along the distal esophagus may reflect mild esophagitis.  The visualized portions of the liver and spleen are unremarkable.  Bilateral breast implants are noted.  No acute osseous abnormalities are seen.  Screws are noted at the left humeral head.  IMPRESSION:  1.  No evidence of significant pulmonary embolus. 2.  Bibasilar pneumonia noted; additional  ground-glass airspace opacification throughout the expanded portions of both lungs, also compatible with pneumonia. 3.  Borderline prominent right  hilar node. 4.  Mild apparent wall thickening along the distal esophagus may reflect mild esophagitis. 5.  Mild luminal narrowing along the left subclavian artery due to a small amount of intramural thrombus.  Original Report Authenticated By: Tonia Ghent, M.D.   Disposition and Follow-up: Stable for discharge to home with Primary Care Physician follow up  Discharge Orders    Future Appointments: Provider: Department: Dept Phone: Center:   03/19/2012 10:30 AM Ladene Artist, MD [facility] [phone] None     Future Orders Please Complete By Expires   Diet - low sodium heart healthy      Increase activity slowly        Follow-up Information    Follow up with Dalbert Mayotte, MD. Schedule an appointment as soon as possible for a visit in 2 weeks.   Contact information:   [address], [address] [place] [address] [phone]         Time spent on Discharge: 40 min.  SignedStephani Police 06/24/2011, 8:16 AM [phone]  Attending I have seen and examined the patient, I agree with the assessment and plan. She is significantly much better, her hypoxia has completely resolved, and will be discharged home on Levaquin.  Dr Windell Norfolk

## 2011-06-24 DIAGNOSIS — D7289 Other specified disorders of white blood cells: Secondary | ICD-10-CM

## 2011-06-24 DIAGNOSIS — F17200 Nicotine dependence, unspecified, uncomplicated: Secondary | ICD-10-CM

## 2011-06-24 DIAGNOSIS — D696 Thrombocytopenia, unspecified: Secondary | ICD-10-CM

## 2011-06-24 DIAGNOSIS — J9600 Acute respiratory failure, unspecified whether with hypoxia or hypercapnia: Secondary | ICD-10-CM

## 2011-06-24 NOTE — Progress Notes (Signed)
Patient being discharged home today. IV site removed and discharge instructions reviewed with patient. Belva Koziel,RN.

## 2011-06-28 LAB — CULTURE, BLOOD (ROUTINE X 2)
Culture  Setup Time: 201305081541
Culture: NO GROWTH

## 2012-01-24 ENCOUNTER — Other Ambulatory Visit: Payer: Self-pay | Admitting: Gastroenterology

## 2012-01-24 DIAGNOSIS — R1310 Dysphagia, unspecified: Secondary | ICD-10-CM

## 2012-01-25 ENCOUNTER — Ambulatory Visit
Admission: RE | Admit: 2012-01-25 | Discharge: 2012-01-25 | Disposition: A | Discharge status: Home / Self Care | Payer: Medicare Other | Source: Ambulatory Visit | Attending: Gastroenterology | Admitting: Gastroenterology

## 2012-01-25 DIAGNOSIS — R1310 Dysphagia, unspecified: Secondary | ICD-10-CM

## 2012-01-30 ENCOUNTER — Other Ambulatory Visit: Payer: Self-pay | Admitting: Gastroenterology

## 2012-01-30 DIAGNOSIS — R1013 Epigastric pain: Secondary | ICD-10-CM

## 2012-02-03 ENCOUNTER — Other Ambulatory Visit: Payer: Self-pay | Admitting: Gastroenterology

## 2012-02-27 ENCOUNTER — Other Ambulatory Visit: Payer: Medicare Other

## 2012-03-06 ENCOUNTER — Ambulatory Visit
Admission: RE | Admit: 2012-03-06 | Discharge: 2012-03-06 | Disposition: A | Discharge status: Home / Self Care | Payer: Medicare Other | Source: Ambulatory Visit | Attending: Gastroenterology | Admitting: Gastroenterology

## 2012-03-06 DIAGNOSIS — R1013 Epigastric pain: Secondary | ICD-10-CM

## 2012-03-19 ENCOUNTER — Telehealth: Payer: Self-pay | Admitting: Oncology

## 2012-03-19 ENCOUNTER — Ambulatory Visit: Payer: Medicare Other | Admitting: Oncology

## 2012-03-19 NOTE — Telephone Encounter (Signed)
Pt called today and r/s appt for today @ 10 am to 05/15/12 MD only, nurse notified

## 2012-05-15 ENCOUNTER — Ambulatory Visit: Payer: Self-pay | Admitting: Oncology

## 2012-05-15 ENCOUNTER — Other Ambulatory Visit: Payer: Self-pay | Admitting: *Deleted

## 2012-05-15 NOTE — Progress Notes (Signed)
[facility] for follow up (1 year) today. POF to reschedule for 1-2 months.

## 2012-05-16 ENCOUNTER — Telehealth: Payer: Self-pay | Admitting: Oncology

## 2012-05-16 NOTE — Telephone Encounter (Signed)
S/w the pt and she is aware of her may 2014 appt

## 2012-06-21 ENCOUNTER — Ambulatory Visit: Payer: Medicare Other | Admitting: Oncology

## 2015-07-29 ENCOUNTER — Telehealth: Payer: Self-pay | Admitting: *Deleted

## 2015-07-29 NOTE — Telephone Encounter (Signed)
Call from pt reporting she received amended mutation report from 2006 testing. 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 Pt is asking if she should come in to see Dr. Benay Spice or send results for his review? She reports she continues follow up with GI on a regular basis. She will make Dr. Osborn Coho office aware of report as well.  Will review with Dr. Benay Spice.

## 2015-07-30 NOTE — Telephone Encounter (Signed)
We are glad to see her here to discuss  Need evaluation note and testing results from genetics counselor  Was she seen here?  Schedule 30 next 4-6 weeks

## 2015-07-31 NOTE — Telephone Encounter (Signed)
Left message on voicemail for pt: Dr. Benay Spice is glad to see you to discuss genetic testing result. MD will need copy of test result and consultation note. Call office to schedule.

## 2015-08-03 ENCOUNTER — Telehealth: Payer: Self-pay | Admitting: Oncology

## 2015-08-03 NOTE — Telephone Encounter (Signed)
lvm for pt regarding to July appt. °

## 2015-09-03 ENCOUNTER — Encounter: Payer: Self-pay | Admitting: *Deleted

## 2015-09-03 ENCOUNTER — Ambulatory Visit (HOSPITAL_BASED_OUTPATIENT_CLINIC_OR_DEPARTMENT_OTHER): Payer: Medicare Other | Admitting: Oncology

## 2015-09-03 ENCOUNTER — Telehealth: Payer: Self-pay | Admitting: Oncology

## 2015-09-03 VITALS — BP 141/71 | HR 79 | Temp 97.7°F | Resp 18 | Ht 62.5 in | Wt 133.3 lb

## 2015-09-03 DIAGNOSIS — Z853 Personal history of malignant neoplasm of breast: Secondary | ICD-10-CM | POA: Diagnosis present

## 2015-09-03 DIAGNOSIS — Z720 Tobacco use: Secondary | ICD-10-CM

## 2015-09-03 NOTE — Progress Notes (Signed)
Oncology Nurse Navigator Documentation  Oncology Nurse Navigator Flowsheets 09/03/2015  Navigator Encounter Type Other  Barriers/Navigation Needs Coordination of Care  Interventions Coordination of Care  Coordination of Care Appts  Acuity Level 1  Time Spent with Patient 15   Per Dr. Gearldine Shown request, order was placed for LDCT screening.  I will also update NP with the LDCT screening program of request.

## 2015-09-03 NOTE — Progress Notes (Signed)
  [place] OFFICE PROGRESS NOTE   Diagnosis: Breast cancer  INTERVAL HISTORY:   Ms. Niklas was last seen at the [facility] in 2013. She reports feeling well. She reports having diverticulitis last year requiring a partial colon resection. She lost weight with the diverticulitis. She has a good appetite. No change over the chest wall. No complaint today. Ms. Vanartsdalen has an extensive family history of cancer including breast cancer in 4 sisters and a niece. Her father died of gastric cancer at age 27 and a paternal aunt had gastric cancer. Other sister had "liver "cancer. She underwent genetic testing at the [facility]. 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 She is here to discuss this result. She continues smoking.  Objective:  Vital signs in last 24 hours:  Blood pressure 141/71, pulse 79, temperature 97.7 F (36.5 C), temperature source Oral, resp. rate 18, height 5' 2.5" (1.588 m), weight 133 lb 4.8 oz (60.464 kg), SpO2 95 %.    HEENT: Neck without mass Lymphatics: No cervical, supraclavicular, axillary, or inguinal nodes Resp: Lungs with scattered rhonchi, no respiratory distress Cardio: Regular rate and rhythm GI: No hepatomegaly, no mass, nontender Vascular: No leg edema Breast: Status post bilateral mastectomy with implants in place. No evidence for chest wall tumor recurrence     Medications: I have reviewed the patient's current medications.  Assessment/Plan: 1. Bilateral invasive lobular breast cancer diagnosed in 04/1999, status post bilateral mastectomy with implants. She was treated with adjuvant AC chemotherapy, and began tamoxifen in 08/1999. She completed 5 years of tamoxifen, and began Femara in 07/2004. She completed 5 years of Femara in 07/2009.  2.Status post left shoulder surgery with a decreased range of motion at the left shoulder. 3.Chronic arthralgias. 4.Status post right total knee replacement 5.  Ongoing tobacco  use-we referred her to the lung cancer screening program on 09/03/2015 6.  History of diverticulitis  7. Family history of breast cancer and gastric cancer, she is confirmed to have a CDH1 mutation  Disposition:  Ms. Dexheimer remains in clinical remission from breast cancer. She would like to [facility]. She will return for an office visit in one year.  She has been diagnosed with a [place] 1 mutation. She was contacted by Retail buyer at the [facility]. She understands the high lifetime risk of gastric cancer and breast cancer. She plans to see Dr. Cristina Gong to discuss the indication for surveillance endoscopies versus a prophylactic gastrectomy. I will refer her to the genetics counselor here and to Dr. Barry Dienes to discuss the indication for a gastrectomy.  I referred her to the lung cancer screening program.  Betsy Coder, MD  09/03/2015  10:52 AM

## 2015-09-03 NOTE — Telephone Encounter (Signed)
Pt confirmed appt and received avs °

## 2015-09-08 ENCOUNTER — Telehealth: Payer: Self-pay | Admitting: *Deleted

## 2015-09-08 NOTE — Telephone Encounter (Signed)
Left VM on mobile voice mail with appointment for 09/18/15 at 0930 to see Dr. Barry Dienes here at [facility] in [facility]. Arrival at 0915 and check in with front desk. Will not need to register for this visit. Requested return call to confirm. Faxed demographics, insurance card, med list and last office note to [facility] [phone]. Also sent message to Dr. Marlowe Aschoff nurse, Raquel Sarna to add appointment to her CCS template on [id number]

## 2015-09-10 ENCOUNTER — Telehealth: Payer: Self-pay | Admitting: Acute Care

## 2015-09-11 ENCOUNTER — Telehealth: Payer: Self-pay | Admitting: Genetic Counselor

## 2015-09-11 ENCOUNTER — Encounter: Payer: Self-pay | Admitting: Genetic Counselor

## 2015-09-11 NOTE — Telephone Encounter (Signed)
LM on VM to please bring a copy of her genetic test results.

## 2015-09-14 ENCOUNTER — Encounter: Payer: Medicare Other | Admitting: Genetic Counselor

## 2015-09-14 ENCOUNTER — Telehealth: Payer: Self-pay | Admitting: *Deleted

## 2015-09-14 ENCOUNTER — Other Ambulatory Visit: Payer: Medicare Other

## 2015-09-14 NOTE — Telephone Encounter (Signed)
Message from pt reporting she was not aware of genetic counseling appointment today. She received a voicemail and would like to reschedule. Message forwarded to scheduler to contact pt with new appt.

## 2015-09-16 ENCOUNTER — Other Ambulatory Visit: Payer: Self-pay | Admitting: Acute Care

## 2015-09-16 DIAGNOSIS — F17210 Nicotine dependence, cigarettes, uncomplicated: Secondary | ICD-10-CM

## 2015-09-16 NOTE — Telephone Encounter (Signed)
Called spoke with pt [facility] scheduled for 10/08/15 CT order placed. Nothing further needed.

## 2015-09-21 ENCOUNTER — Encounter: Payer: Medicare Other | Admitting: Genetic Counselor

## 2015-09-25 ENCOUNTER — Telehealth: Payer: Self-pay | Admitting: Genetic Counselor

## 2015-09-25 NOTE — Telephone Encounter (Signed)
Lt mess to reschedule genetic appt.

## 2015-09-29 ENCOUNTER — Telehealth: Payer: Self-pay | Admitting: *Deleted

## 2015-09-29 NOTE — Telephone Encounter (Signed)
"  I had an appointment 09-14-2015 can you tell me what this is for and I need to reschedule because I forgot about it." Call transferred to [phone] and will notify Genetic Counselors.

## 2015-10-06 ENCOUNTER — Encounter: Payer: Self-pay | Admitting: Genetic Counselor

## 2015-10-06 ENCOUNTER — Telehealth: Payer: Self-pay | Admitting: Genetic Counselor

## 2015-10-06 NOTE — Telephone Encounter (Signed)
Pt confirmed appt, completed intake, mailed pt letter °

## 2015-10-08 ENCOUNTER — Encounter: Payer: Self-pay | Admitting: Acute Care

## 2015-10-08 ENCOUNTER — Ambulatory Visit: Admission: RE | Admit: 2015-10-08 | Payer: Medicare Other | Source: Ambulatory Visit

## 2015-10-08 ENCOUNTER — Ambulatory Visit (INDEPENDENT_AMBULATORY_CARE_PROVIDER_SITE_OTHER): Payer: Medicare Other | Admitting: Acute Care

## 2015-10-08 DIAGNOSIS — F17210 Nicotine dependence, cigarettes, uncomplicated: Secondary | ICD-10-CM

## 2015-10-08 NOTE — Progress Notes (Signed)
Shared Decision Making Visit Lung Cancer Screening Program [phone])   Eligibility:  Age 65 y.o.  Pack Years Smoking History Calculation 40.5 pack year smoker (# packs/per year x # years smoked)  Recent History of coughing up blood  no  Unexplained weight loss? no ( >Than 15 pounds within the last 6 months )  Prior History Lung / other cancer no (Diagnosis within the last 5 years already requiring surveillance chest CT Scans).  Smoking Status Current Smoker  Former Smokers: Years since quit: NA  Quit Date: NA  Visit Components:  Discussion included one or more decision making aids. yes  Discussion included risk/benefits of screening. yes  Discussion included potential follow up diagnostic testing for abnormal scans. yes  Discussion included meaning and risk of over diagnosis. yes  Discussion included meaning and risk of False Positives. yes  Discussion included meaning of total radiation exposure. yes  Counseling Included:  Importance of adherence to annual lung cancer LDCT screening. yes  Impact of comorbidities on ability to participate in the program. yes  Ability and willingness to under diagnostic treatment. yes  Smoking Cessation Counseling:  Current Smokers:   Discussed importance of smoking cessation. yes  Information about tobacco cessation classes and interventions provided to patient. yes  Patient provided with "ticket" for LDCT Scan. yes  Symptomatic Patient. no  Counseling  Diagnosis Code: Tobacco Use Z72.0  Asymptomatic Patient yes  Counseling (Intermediate counseling: > three minutes counseling) [id number]  Former Smokers:   Discussed the importance of maintaining cigarette abstinence. yes  Diagnosis Code: Personal History of Nicotine Dependence. [id number]  Information about tobacco cessation classes and interventions provided to patient. Yes  Patient provided with "ticket" for LDCT Scan. yes  Written Order for Lung Cancer Screening with  LDCT placed in Epic. Yes (CT Chest Lung Cancer Screening Low Dose W/O CM) [id number] Z12.2-Screening of respiratory organs Z87.891-Personal history of nicotine dependence   I have spent 30 minutes of face to face time with Ms. Holloman and her husband  discussing the risks and benefits of lung cancer screening. We viewed a power point together that explained in detail the above noted topics. We paused at intervals to allow for questions to be asked and answered to ensure understanding.We discussed that the single most powerful action that she  can take to decrease her risk of developing lung cancer is to quit smoking. We discussed whether or not she is ready to commit to setting a quit date. She is currently not ready to set a quit date.We discussed options for tools to aid in quitting smoking including nicotine replacement therapy, non-nicotine medications, support groups, Quit Smart classes, and behavior modification. We discussed that often times setting smaller, more achievable goals, such as eliminating 1 cigarette a day for a week and then 2 cigarettes a day for a week can be helpful in slowly decreasing the number of cigarettes smoked. This allows for a sense of accomplishment as well as providing a clinical benefit. I gave her the " Be Stronger Than Your Excuses" card with contact information for community resources, classes, free nicotine replacement therapy, and access to mobile apps, text messaging, and on-line smoking cessation help. I have also given her my card and contact information in the event she needs to contact me. We discussed the time and location of the scan, and that either June Leap, CMA, or I will call with the results within 24-48 hours of receiving them. I have provided her with a copy of the power  point we viewed  as a resource in the event they need reinforcement of the concepts we discussed today in the office. The patient verbalized understanding of all of  the above and had no  further questions upon leaving the office. They have my contact information in the event they have any further questions.   Magdalen Spatz, NP 10/08/2015

## 2015-10-14 ENCOUNTER — Telehealth: Payer: Self-pay | Admitting: *Deleted

## 2015-10-14 DIAGNOSIS — Z853 Personal history of malignant neoplasm of breast: Secondary | ICD-10-CM

## 2015-10-14 NOTE — Telephone Encounter (Signed)
Dr. Benay Spice discussed case with Dr. Arvin Collard. Order received for referral to Dr. Altamease Oiler to discuss recommendation for gastrectomy in the setting of CDHI mutation.  Referral entered. Left message on voicemail for pt to call office.

## 2015-10-15 ENCOUNTER — Telehealth: Payer: Self-pay | Admitting: *Deleted

## 2015-10-15 NOTE — Telephone Encounter (Signed)
"  I received a call yesterday and returning the call.  Do I need to call this doctor to schedule anything." Advised Dr. Randell Loop office will call her with appointment information.  "Dr. Arvin Collard has done my previous surgeries."  Call transferred to ext [phone].

## 2015-10-15 NOTE — Telephone Encounter (Signed)
Spoke with pt, informed her that [facility] will be contacting her with appointments. Dr. Benay Spice recommends she keep appt with genetics counselor here and at [facility]. She voiced understanding and appreciation for Dr. Gearldine Shown care.

## 2015-10-22 ENCOUNTER — Telehealth: Payer: Self-pay | Admitting: Oncology

## 2015-10-22 NOTE — Telephone Encounter (Signed)
Faxed pt records to [facility] they will call pt with appt. Dr Altamease Oiler request the pt to see the Genetic Counselor before scheduling appt with her.

## 2015-11-12 ENCOUNTER — Other Ambulatory Visit: Payer: Medicare Other

## 2015-11-12 ENCOUNTER — Ambulatory Visit (HOSPITAL_BASED_OUTPATIENT_CLINIC_OR_DEPARTMENT_OTHER): Payer: Medicare Other | Admitting: Genetic Counselor

## 2015-11-12 ENCOUNTER — Telehealth: Payer: Self-pay | Admitting: *Deleted

## 2015-11-12 DIAGNOSIS — Z853 Personal history of malignant neoplasm of breast: Secondary | ICD-10-CM | POA: Diagnosis present

## 2015-11-12 DIAGNOSIS — Z1509 Genetic susceptibility to other malignant neoplasm: Secondary | ICD-10-CM

## 2015-11-12 DIAGNOSIS — Z315 Encounter for genetic counseling: Secondary | ICD-10-CM

## 2015-11-12 DIAGNOSIS — Z1589 Genetic susceptibility to other disease: Secondary | ICD-10-CM

## 2015-11-12 DIAGNOSIS — Z8371 Family history of colonic polyps: Secondary | ICD-10-CM | POA: Diagnosis not present

## 2015-11-12 DIAGNOSIS — Z1501 Genetic susceptibility to malignant neoplasm of breast: Secondary | ICD-10-CM | POA: Diagnosis not present

## 2015-11-12 DIAGNOSIS — Z800 Family history of malignant neoplasm of digestive organs: Secondary | ICD-10-CM

## 2015-11-12 DIAGNOSIS — Z803 Family history of malignant neoplasm of breast: Secondary | ICD-10-CM | POA: Diagnosis not present

## 2015-11-12 NOTE — Telephone Encounter (Signed)
"  I need to talk with my nurse about a surgeon referral due to positive genetic test.  When, where, what time is the appointment."  Call transferred ext [phone].

## 2015-11-12 NOTE — Telephone Encounter (Signed)
Left message for Lindsey Rose, HIM/referrals to follow up. Referral to genetics at [facility] was made on 10/22/15.

## 2015-11-15 ENCOUNTER — Encounter: Payer: Self-pay | Admitting: Genetic Counselor

## 2015-11-15 NOTE — Progress Notes (Signed)
REFERRING PROVIDER: Ladell Pier., MD  PRIMARY PROVIDER:  Curly Rim, MD  PRIMARY REASON FOR VISIT:  1. Monoallelic mutation of CDH1 gene   2. History of breast cancer in female   3. Family history of breast cancer in female   27. Family history of gastric cancer   5. Family history of colonic polyps      HISTORY OF PRESENT ILLNESS:   Ms. Bolte, a 65 y.o. female, was seen for a [facility] cancer genetics consultation at the request of Dr. Benay Spice to discuss her previous diagnosis of a known pathogenic CDH1 gene mutation and due to her personal and family history of cancer.  Ms. Kittleson presents to clinic today with her husband to discuss the hereditary predisposition to cancer, genetic testing, and to further clarify her future cancer risks, as well as potential cancer risks for family members.   In February 2001, at the age of 6, Ms. 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  Hormone receptor status was ER+, PR-, and Her2+.  This was treated with bilateral mastectomies.  Ms. Jocelyn reportedly first had negative genetic testing through the [facility].  However, she reports that this same laboratory followed up with her later to let her know that they found a pathogenic mutation in the CDH1 gene.  Other family members have also tested positive for this same gene mutation.  At the time of this visit a copy of this test result was not available, but we will obtain a copy from Dr. Benay Spice to verify.  HORMONAL RISK FACTORS:  Menarche was at age 26.  First live birth at age - not assessed.  OCP use for approximately 10 years.  Ovaries intact: yes.  Hysterectomy: yes, at age 65 to treat 'infection in uterus".  Menopausal status: postmenopausal.  HRT use: estrogen following menopause at 77, for unspecified amount of time. Colonoscopy: yes; reports approx 8-9 total polyps; also has routine EGDs. Mammogram within the last year:  n/a. Number of breast biopsies: not assessed. Up to date with pelvic exams:  n/a. 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 ADD (attention deficit disorder with hyperactivity)   . ADHD (attention deficit hyperactivity disorder)   . Anemia   . Anxiety   . Arthritis   . Breast cancer ([place])   . Bursitis of hip    bilaterally  . Chronic lower back pain    "radiates down my legs"  . Colon polyps   . Depression   . Disc degeneration   . Diverticulosis   . GERD (gastroesophageal reflux disease)   . H/O hiatal hernia   . History of bronchitis   . History of chemotherapy 2001  . Insomnia   . Internal hemorrhoid   . Pneumonia 06/22/11;    "first time"  . Respiratory distress fall 2012   "not on ventilator"    Past Surgical History:  Procedure Laterality Date  . ABDOMINAL HYSTERECTOMY  1976   "partial"  . APPENDECTOMY  1977  . BREAST BIOPSY  2001   right  . CHOLECYSTECTOMY  1971  . MASTECTOMY  2001   Bilateral  . ORIF SHOULDER FRACTURE  06/2006   left  . PORT-A-CATH REMOVAL  2001   right chest; "for chemo"  . SHOULDER SURGERY  10/2006   left; "put pins in it"  .  TOTAL KNEE ARTHROPLASTY  12/2006   right  . TOTAL SHOULDER REPLACEMENT      Social History   Social History  . Marital status: Married    Spouse name: N/A  . Number of children: 2  . Years of education: N/A   Occupational History  .      Disability   Social History Main Topics  . Smoking status: Current Every Day Smoker    Packs/day: 1.00    Years: 42.00    Types: Cigarettes  . Smokeless tobacco: Never Used     Comment: 1 ppd x 30 years/ 1/2 PPD x 21 years  . Alcohol use 1.2 oz/week    2 Cans of beer per week  . Drug use: No  . Sexual activity: Not Currently   Other Topics Concern  . Not on file   Social History Narrative  . No narrative on file      FAMILY HISTORY:  We obtained a detailed, 4-generation family history.  Significant diagnoses are listed below: Family History  Problem Relation Age of Onset  . Heart disease Father     MI at age 76  . Gastric cancer Father 4  . Hypertension Mother   . Diabetes Mother   . Colon polyps Mother     approx 2-3  . Aneurysm Paternal Grandmother 69  . Stroke Maternal Grandfather     d. 70s  . Breast cancer Sister 51    negative for familial CDH1 mutation  . Breast cancer Sister 11    negative for familial CDH1 mutation  . Breast cancer Sister 31    s/p mastectomy  . Other Sister     +CDH1 mutation  . Colon polyps Sister     36+  . Liver cancer Sister 62  . Gastric cancer Paternal Aunt 29    s/p partial gastrectomy  . Breast cancer Other 84    niece; negative for familial CDH1 mutation  . Other Other 21    nephew d. due to congenital birth defect (affecting brain)  . Aneurysm Maternal Uncle     multiple maternal uncles d. due to brain aneurysms, <50  . Lymphoma Cousin 74    maternal 1st cousin  . Breast cancer Cousin     maternal 1st cousin dx. in her 7s  . Breast cancer Other     maternal great aunt (MGF's sister) d. 81s; s/p mastectomy  . Diabetes Other     Ms. Benavidez has two sons, one of whom passed away from a blood clot at 43.  Her other son is currently 10 and is cancer-free.  He has recently had genetic testing and is awaiting his genetic test results.  He has two sons and one daughter of his own.  Ms. Sundby has six full sisters and one full brother.  Her brother was murdered at the age of 90.  Two of her sisters have also passed away--one died of liver cancer at 7 and the other died in a car accident at 7.  None of these siblings or any of their children have ever had genetic testing.  One of Ms. Latimore's sister's, currently 47, was diagnosed with breast cancer at 6, and she also has a history of 20+ colon polyps.  She has tested positive for the CDH1 gene mutation.   She has one son and one daughter, both of whom are in their 40s and who have not yet had genetic testing.  Ms. Circle also has two  identical twin sisters who are currently 31.  Both of these sisters were diagnosed with breast cancer at 24, but they have both tested negative for the known familial CDH1 mutation.  One of the twin sisters has no children of her own, while the other has one son and one daughter.  This niece was diagnosed with breast cancer at 68 and she has also had negative genetic testing.  The remaining sister is currently 60, cancer-free, and she is currently awaiting her genetic test results.    Ms. Areola mother is currently 91 and has never had cancer.  She does have a history of approximately 2-3 colon polyps.  Ms. Hockenberry mother had 48 full brothers and one full sister, all of whom have passed away.  Her sister died of a blood clot at 49.  Two brothers died in their 11s, one died at 80 when his "heart burst", one brother died in a skiing accident at 68, and several of the other six brothers reportedly passed away from brain aneurysms younger than the age of 47.  Ms. Buntyn's maternal grandmother passed away at 45.  She had three sisters who passed away at older ages.  Ms. Haubner maternal grandfather died of a stroke in his 79s.  He had a sister who had breast cancer and passed away in her 42s.  Ms. Azpeitia has limited information for other maternal great aunts/uncles and great grandparents, but she is unaware of any additional cancer history.  Ms. Schreier father was diagnosed with and passed away from gastric cancer at 64.  He had one full sister who was diagnosed with gastric cancer at 25 and was treated with partial gastrectomy.  She passed away at 27.  She had three daughters and three sons, none of whom have had cancer.  These cousins have never had genetic testing, to Ms. Rojek's knowledge.  Her paternal grandmother died of a hemorrhage/aneurysm at 7.  Her grandfather passed due to  suicide at 77.  She has no further information for any of her paternal great aunts/uncles and great grandparents.  Patient's maternal and paternal ancestors are of [place] and Native American/[place] descent. There is no reported Ashkenazi Jewish ancestry. There is no known consanguinity.  GENETIC COUNSELING ASSESSMENT: Phoenix Riesen is a 65 y.o. female with a known pathogenic CDH1 gene mutation which causes her to be at a high risk for gastric cancer and which explains her history of lobular breast cancer. We, therefore, discussed and recommended the following at today's visit.   DISCUSSION: We reviewed the characteristics, features and inheritance pattern of hereditary diffuse gastric cancer syndrome. We also discussed genetic testing, including the appropriate family members to test who have still not had genetic testing. Because Ms. Bochicchio has already had a positive genetic test result, and it seems that it likely explains her personal and much of the family history of cancer, it is unlikely that additional genetic testing is needed.  However, we discussed that we would still like to see a copy of her test result to confirm and to determine how many genes were analyzed at the time.  Ms. Kozinski says that this test result has been shared with Dr. Benay Spice, so we will make sure to obtain a copy, as it cannot be found in her chart at this time.    We reviewed the increased risk for breast cancer, especially lobular type, that is conferred by a pathogenic mutation in the [facility] gene for women.  Artist (NCCN)  guidelines (v 2.2017) recommend annual mammogram and consider annual breast MRI with contrast beginning at age 99 and recommends consideration of risk-reducing mastectomies based on family history.  We discussed that Ms. Manganelli has reduced her breast cancer risk as much as is possible through having bilateral mastectomies.   Pathogenic mutations within the CDH1 gene cause a  lifetime risk (to age 52) of gastric cancer of approximately 67% for men and 83% for women.  We discussed that the average age of gastric cancer diagnosis for individuals who have a CDH1 mutation is approximately 70.  Thus, the NCCN recommends that prophylactic gastrectomy be performed between age 54 and 77.  The NCCN recommendations state that, for individuals who choose not to undergo gastrectomy, that they should be offered upper endoscopy with multiple random biopsies every 6-12 months.  As with any gene, we may continue to learn more about its role in the body and resulting cancer risks caused by pathogenic mutations.  It is important that Ms. Honda continue to keep her contact information up-to-date with [facility], as we will try to contact her if we find out more about CDH1-related cancer risks and/or medical management recommendations.  Ms. Pruett and her husband have concerns about the prophylactic gastrectomy surgery.  Their primary concerns center around the invasiveness and severity of the surgery, especially in consideration of Ms. Skoog's age and the fact that she has, as yet, been unable to stop smoking entirely.  They are still open to learning as much as they can about this surgery, the benefits and the risks, however.  They have not yet met with the surgeon, and they may have missed this appointment.  I advised that they get in touch with Dr. Gearldine Shown nurse to follow-up about this referral.  We also discussed that Ms. Zeigler has outlived some of this 83% lifetime risk.  She is 66 and, thus, her risk for gastric cancer to age 7 is different than that for someone with a CDH1 mutation who is 17.  However, we also reviewed that she is still at a high gastric cancer risk compared to someone who is 55 and who does not have a CDH1 gene mutation.  Unfortunately, we do not have CDH1-related gastric cancer risk tables that demonstrate more clearly a specific age-related risk.  Still yet, Ms. Lohmann  and her husband appreciated this thought, as they had not yet considered this perspective.  We further discussed the importance of genetic testing for at-risk family members.  We discussed autosomal dominant inheritance and the 50% risk to her son and her siblings.  It truly seems like this mutation was most likely inherited from her father's side of the family, due to the paternal family history of gastric cancer.  I will provide Ms. Zappia with some copies of a family letter that she may find helpful to share with her relatives who are still reluctant to consider genetic counseling and testing.    PLAN: Ms. Brickell and her husband plan to further review prophylactic gastrectomy with the surgeon.  She and her doctor will then make the decision whether to proceed with surgery or whether to continue with available screening options.  We will maintain a copy of Ms. Hardage's positive genetic test report, so that we can verify the personal and familial cancer risks.  We will send Ms. Zenia Resides a results letter detailing our discussion, a family letter to share with at-risk relatives, and support resources.    We encouraged Ms. Kelliher to remain  in contact with cancer genetics annually so that we can continuously update the family history and inform her of any changes in cancer genetics and testing that may be of benefit for her family. Ms. Folden questions were answered to her satisfaction today. Our contact information was provided should additional questions or concerns arise. Thank you for the referral and allowing [place] to share in the care of your patient.   Jeanine Luz, MS, [facility] Certified Genetic Counselor [email or web address] Phone: [phone]  The patient was seen for a total of 60 minutes in face-to-face genetic counseling.  This patient was discussed with Drs. Magrinat, Lindi Adie and/or Burr Medico who agrees with the above.    _______________________________________________________________________ For  Office Staff:  Number of people involved in session: 2 Was an Intern/ student involved with case: no

## 2015-11-30 ENCOUNTER — Telehealth: Payer: Self-pay | Admitting: Oncology

## 2015-11-30 NOTE — Telephone Encounter (Signed)
Faxed records to [place] at [facility]. Virgilio Belling will call pt with appt. Fax [phone]

## 2015-12-18 ENCOUNTER — Telehealth: Payer: Self-pay | Admitting: *Deleted

## 2015-12-18 NOTE — Telephone Encounter (Deleted)
-----   Message from Tania Ade, RN sent at 12/07/2015 10:44 AM EDT ----- Regarding: RE: Copy of Genetic Test Results [name], This patient is not a GI patient--she is followed for breast cancer, so I am going to forward this message to Dr. Gearldine Shown collaborative nurse. #Lindsey Rose & Lindsey Rose-please see note below from genetics. Lindsey Rose ----- Message ----- From: Sheilah Mins, Counselor Sent: 11/23/2015   9:47 AM To: Tania Ade, RN Subject: Copy of Genetic Test Results                   Lindsey Rose,  I saw [name] for genetic counseling on 9/28.  She did not have a copy of her positive test report (CDH1 gene mutation) at the time, but told me that Dr. Benay Spice should have a copy.  I cannot find this in her chart.  Any way that you could get a copy to me?  I'd like to verify how many genes she had analyzed and send her a copy of her results, if possible.  Thank you! Lonn Georgia

## 2015-12-18 NOTE — Telephone Encounter (Signed)
Message from pt requesting appointment with Dr. Benay Spice in December to discuss things. She has not seen the surgeon yet and wants to review plan with Dr. Benay Spice. Left message on voicemail requesting pt bring a copy of her genetic testing results.

## 2016-01-25 ENCOUNTER — Ambulatory Visit: Payer: Medicare Other | Admitting: Oncology

## 2016-02-04 ENCOUNTER — Telehealth: Payer: Self-pay | Admitting: *Deleted

## 2016-02-04 NOTE — Telephone Encounter (Signed)
Pt called to reschedule her missed appt w/ Dr. Benay Spice on 12/11.  She says she had an emergency out of town that day and could not make her appt.. She says her son has the "same mutation as me" and was having some problems.  She asks to see Dr. Benay Spice as soon as possible.  Informed her Dr. Benay Spice is out of the office this week.  Will forward to request to his nurse.  She verbalized understanding.

## 2016-02-05 NOTE — Telephone Encounter (Signed)
Message sent to scheduling to schedule appt with Dr. Benay Spice in January.

## 2016-02-22 ENCOUNTER — Telehealth: Payer: Self-pay | Admitting: Oncology

## 2016-02-22 NOTE — Telephone Encounter (Signed)
SPOKE WITH PATIENT RE 1/19 FU

## 2016-02-23 ENCOUNTER — Telehealth: Payer: Self-pay | Admitting: *Deleted

## 2016-02-23 NOTE — Telephone Encounter (Signed)
Left message on voicemail to remind pt to bring copy of genetic test results to 1/19 visit.

## 2016-03-04 ENCOUNTER — Ambulatory Visit (HOSPITAL_BASED_OUTPATIENT_CLINIC_OR_DEPARTMENT_OTHER): Payer: Medicare Other | Admitting: Oncology

## 2016-03-04 VITALS — BP 130/84 | HR 81 | Temp 98.4°F | Resp 18 | Ht 62.5 in | Wt 140.6 lb

## 2016-03-04 DIAGNOSIS — Z720 Tobacco use: Secondary | ICD-10-CM | POA: Diagnosis not present

## 2016-03-04 DIAGNOSIS — Z853 Personal history of malignant neoplasm of breast: Secondary | ICD-10-CM | POA: Diagnosis present

## 2016-03-04 NOTE — Progress Notes (Signed)
  [place] OFFICE PROGRESS NOTE   Diagnosis: Breast cancer  INTERVAL HISTORY:   Lindsey Rose returns as scheduled. She feels well. She has noted tenderness at the upper sternum for the past week. She wonders whether this is related to the physical therapy she has been doing for her shoulder, or from working at home. She continues smoking. She is scheduled for an appointment at the [place] clinic in 2 weeks to discuss the [facility] 1 mutation and indication for a prophylactic gastrectomy. Her son has been diagnosed with the [facility] 1 mutation and is being scheduled for a gastrectomy.  Objective:  Vital signs in last 24 hours:  Blood pressure 130/84, pulse 81, temperature 98.4 F (36.9 C), temperature source Oral, resp. rate 18, height 5' 2.5" (1.588 m), weight 140 lb 9.6 oz (63.8 kg), SpO2 95 %.    HEENT: Neck without mass Lymphatics: No cervical, supraclavicular, or axillary nodes Resp: Scattered inspiratory/expiratory rhonchi and wheezes, no respiratory distress Cardio: Regular rate and rhythm GI: No hepatomegaly Vascular: No leg edema Breasts: Status post bilateral mastectomy. No evidence for chest wall tumor recurrence.  Skin: Faint erythema at the upper anterior chest and lower neck bilaterally without nodularity.  Musculoskeletal: Mild tenderness at the upper sternum, no mass    Lab Results:  Lab Results  Component Value Date   WBC 17.1 (H) 06/23/2011   HGB 10.4 (L) 06/23/2011   HCT 31.0 (L) 06/23/2011   MCV 87.3 06/23/2011   PLT 205 06/23/2011   NEUTROABS 18.0 (H) 06/22/2011     Medications: I have reviewed the patient's current medications.  Assessment/Plan: 1. Bilateral invasive lobular breast cancer diagnosed in 04/1999, status post bilateral mastectomy with implants. She was treated with adjuvant AC chemotherapy, and began tamoxifen in 08/1999. She completed 5 years of tamoxifen, and began Femara in 07/2004. She completed 5 years of Femara in  07/2009.  2.Status post left shoulder surgery with a decreased range of motion at the left shoulder. 3.Chronic arthralgias. 4.Status post right total knee replacement 5.  Ongoing tobacco use-we referred her to the lung cancer screening program on 09/03/2015 6.  History of diverticulitis  7. Family history of breast cancer and gastric cancer, she is confirmed to have a CDH1 mutation, Status post an upper endoscopy by Dr.Buccini , Scheduled for an evaluation in the genetics program at [facility] in late January 2018    Disposition:  [name] remains in clinical remission from breast cancer. I counseled her to discontinue smoking. She is undecided about the prophylactic gastrectomy. She reports Dr. Cristina Gong and her surgeon in [place] have recommended a gastrectomy. She will be seen in the [facility] genetics clinic within the next few weeks for further discussion. She says her son will also attend this visit. I am available to speak with her by telephone as needed.  She will return for an office visit in one year. She will contact us if the discomfort at the upper chest does not improve.  Lindsey Rose, M.D.     Lindsey Coder, MD  03/04/2016  11:51 AM

## 2016-04-01 ENCOUNTER — Other Ambulatory Visit: Payer: Self-pay | Admitting: Acute Care

## 2016-04-01 DIAGNOSIS — F17210 Nicotine dependence, cigarettes, uncomplicated: Secondary | ICD-10-CM

## 2016-04-04 ENCOUNTER — Inpatient Hospital Stay: Admission: RE | Admit: 2016-04-04 | Payer: Medicare Other | Source: Ambulatory Visit

## 2016-04-12 ENCOUNTER — Telehealth: Payer: Self-pay | Admitting: Acute Care

## 2016-04-12 ENCOUNTER — Ambulatory Visit: Payer: Medicare Other

## 2016-04-12 ENCOUNTER — Ambulatory Visit (INDEPENDENT_AMBULATORY_CARE_PROVIDER_SITE_OTHER)
Admission: RE | Admit: 2016-04-12 | Discharge: 2016-04-12 | Disposition: A | Discharge status: Home / Self Care | Payer: Medicare Other | Source: Ambulatory Visit | Attending: Acute Care | Admitting: Acute Care

## 2016-04-12 DIAGNOSIS — Z87891 Personal history of nicotine dependence: Secondary | ICD-10-CM

## 2016-04-12 DIAGNOSIS — F17210 Nicotine dependence, cigarettes, uncomplicated: Secondary | ICD-10-CM

## 2016-04-12 NOTE — Telephone Encounter (Signed)
I have called Mrs. Much with the results of her low-dose screening CT. Her scan was read as a Lung RADS 4 A : suspicious findings, either short term follow up in 3 months or alternatively  PET Scan evaluation may be considered when there is a solid component of  8 mm or larger. I explained that we would need to do a follow-up scan in 3 months. I also explained that the radiologist suspected there may be a possibility of infection that may have caused the abnormal scan. The patient confirmed that she has had a bronchitis-like illness and has been treated recently with antibiotics. She states she is continuing to cough up thick green secretions. We will do a follow-up scan in 3 months. At her request I'm sending her copy of this scan to discuss with her physician who manages her obstructive sleep apnea. Additionally I have faxed a copy of this result to her primary care physician Dr. Neta Mends. We will arrange for her follow-up scan at [facility] as this is more convenient for her. She verbalized understanding of the above and had no further questions at completion of the call.

## 2016-05-04 ENCOUNTER — Telehealth: Payer: Self-pay | Admitting: *Deleted

## 2016-05-04 NOTE — Telephone Encounter (Signed)
Message from pt reporting pain. L posterior rib is "so tender I can't touch it." Returned call, pt denies any injury to area. No rash or bruising to area.  Initially noticed pain about a month ago while showering. Pain increased over past week. Appt given for 3/22 @ 3:15 with Ned Card, NP. Pt voiced appreciation for call.

## 2016-05-05 ENCOUNTER — Telehealth: Payer: Self-pay | Admitting: *Deleted

## 2016-05-05 ENCOUNTER — Ambulatory Visit (HOSPITAL_COMMUNITY)
Admission: RE | Admit: 2016-05-05 | Discharge: 2016-05-05 | Disposition: A | Discharge status: Home / Self Care | Payer: Medicare Other | Source: Ambulatory Visit | Attending: Nurse Practitioner | Admitting: Nurse Practitioner

## 2016-05-05 ENCOUNTER — Ambulatory Visit (HOSPITAL_BASED_OUTPATIENT_CLINIC_OR_DEPARTMENT_OTHER): Payer: Medicare Other | Admitting: Nurse Practitioner

## 2016-05-05 VITALS — BP 115/60 | HR 83 | Temp 98.0°F | Resp 18 | Ht 62.5 in | Wt 139.0 lb

## 2016-05-05 DIAGNOSIS — Z853 Personal history of malignant neoplasm of breast: Secondary | ICD-10-CM

## 2016-05-05 DIAGNOSIS — R0781 Pleurodynia: Secondary | ICD-10-CM | POA: Diagnosis present

## 2016-05-05 DIAGNOSIS — R0789 Other chest pain: Secondary | ICD-10-CM | POA: Diagnosis present

## 2016-05-05 NOTE — Telephone Encounter (Signed)
-----   Message from Lindsey Pier, MD sent at 05/05/2016  5:12 PM EDT ----- Please call patient, rib films are negative, call for persistent pain

## 2016-05-05 NOTE — Telephone Encounter (Signed)
Message left on patient's private home phone number to notify her per order of Dr. Benay Spice that rib films are negative and to call for persistent pain.  Instructed patient to call [facility] back tomorrow with any questions or concerns.

## 2016-05-05 NOTE — Progress Notes (Signed)
  [place] OFFICE PROGRESS NOTE   Diagnosis: Breast cancer   INTERVAL HISTORY:   Lindsey Rose returns prior to scheduled follow-up for evaluation of left posterior rib pain. She reports onset of left lower rib/chest pain about 3 weeks ago. No known injury. No pain in the absence of palpation over this area. The pain is unchanged over the past 3 weeks. No shortness of breath. No cough. No fever. No rash.  Objective:  Vital signs in last 24 hours:  Blood pressure 115/60, pulse 83, temperature 98 F (36.7 C), temperature source Oral, resp. rate 18, height 5' 2.5" (1.588 m), weight 139 lb (63 kg), SpO2 100 %.   Resp: Scattered expiratory rhonchi. No respiratory distress. Cardio: Regular rate and rhythm. GI: Abdomen soft and nontender. No hepatomegaly. Vascular: No leg edema. Musculoskeletal: Focal area of tenderness left lower posterolateral lateral chest overlying a rib. Skin: No rash.    Lab Results:  Lab Results  Component Value Date   WBC 17.1 (H) 06/23/2011   HGB 10.4 (L) 06/23/2011   HCT 31.0 (L) 06/23/2011   MCV 87.3 06/23/2011   PLT 205 06/23/2011   NEUTROABS 18.0 (H) 06/22/2011    Imaging:  No results found.  Medications: I have reviewed the patient's current medications.  Assessment/Plan: 1. Bilateral invasive lobular breast cancer diagnosed in 04/1999, status post bilateral mastectomy with implants. She was treated with adjuvant AC chemotherapy, and began tamoxifen in 08/1999. She completed 5 years of tamoxifen, and began Femara in 07/2004. She completed 5 years of Femara in 07/2009.  2.Status post left shoulder surgery with a decreased range of motion at the left shoulder. 3.Chronic arthralgias. 4.Status post right total knee replacement 5. Ongoing tobacco use-we referred her to the lung cancer screening program on 09/03/2015 6. History of diverticulitis  7. Family history of breast cancer and gastric cancer, she is confirmed to have  a CDH1 mutation, Status post an upper endoscopy by Dr.Buccini , Scheduled for an evaluation in the genetics program at [facility] in late January 2018   Disposition: [name] has tenderness overlying the left lower posterolateral ribs. Question rib fracture. We are referring her for a plain x-ray of this area. We will contact her with that result. She will follow-up as scheduled.  Patient seen with Dr. Benay Spice.  Ned Card ANP/GNP-BC   05/05/2016  3:46 PM  This was a shared visit with Ned Card. As Bastedo was interviewed and examined. We will refer her for plain x-rays of the left ribs to look for evidence of a fracture or a metastasis. She will contact Lindsey Rose for persistent left chest pain. I encouraged her to reschedule the appointment with the genetics program at [facility]  Julieanne Manson, M.D.

## 2016-06-16 ENCOUNTER — Telehealth: Payer: Self-pay | Admitting: Acute Care

## 2016-06-16 NOTE — Telephone Encounter (Signed)
Sorry...routed to [facility] pool by mistake. Re-routed to lung nodule pool.

## 2016-06-20 ENCOUNTER — Telehealth: Payer: Self-pay | Admitting: *Deleted

## 2016-06-20 NOTE — Telephone Encounter (Signed)
Message from pt reporting ankle swelling and pain "down to the bone." Pt reports she has seen PCP twice and has been to ED twice for this pain. She is concerned it may be cancer related. Reviewed with Dr. Benay Spice: Continue follow up with PCP for ankle pain, may need to see ortho. Not likely related to cancer. Called pt with the above, she voiced understanding.

## 2016-06-20 NOTE — Telephone Encounter (Signed)
Rodena [facility]) to contact pt to schedule f/u Ct

## 2016-07-13 ENCOUNTER — Ambulatory Visit (INDEPENDENT_AMBULATORY_CARE_PROVIDER_SITE_OTHER): Payer: Medicare Other

## 2016-07-13 DIAGNOSIS — I700 Atherosclerosis of aorta: Secondary | ICD-10-CM | POA: Diagnosis not present

## 2016-07-13 DIAGNOSIS — Z122 Encounter for screening for malignant neoplasm of respiratory organs: Secondary | ICD-10-CM | POA: Diagnosis not present

## 2016-07-13 DIAGNOSIS — F17210 Nicotine dependence, cigarettes, uncomplicated: Secondary | ICD-10-CM

## 2016-07-13 DIAGNOSIS — J439 Emphysema, unspecified: Secondary | ICD-10-CM

## 2016-07-14 ENCOUNTER — Other Ambulatory Visit: Payer: Self-pay | Admitting: Acute Care

## 2016-07-14 ENCOUNTER — Telehealth: Payer: Self-pay | Admitting: Emergency Medicine

## 2016-07-14 DIAGNOSIS — F17210 Nicotine dependence, cigarettes, uncomplicated: Secondary | ICD-10-CM

## 2016-07-14 DIAGNOSIS — R911 Solitary pulmonary nodule: Secondary | ICD-10-CM

## 2016-07-14 NOTE — Telephone Encounter (Signed)
Spoke with pt and advised of CT results per Dr bryum.  Pt verbalized understanding.  Order placed for asap PET scan.  Will forward this message to Eric Form for review.

## 2016-07-14 NOTE — Telephone Encounter (Signed)
Dr Lamonte Sakai,   Eric Form is out of the office this week and has requested any Lung rads 3 or 4 be addressed in her absence.  This pt was a 3 mth repeat Ct.  Her Ct in 03/2016 was a Lung rads 4A and is now a 4B with some growth of the nodule.  (results in epic) Her PCP is a Dr Neta Mends.  Please advise

## 2016-07-14 NOTE — Telephone Encounter (Signed)
I reviewed her scan. I believe that she should have a PET scan ASAP to better characterize. Depending on the results she will need to be seen either by TCTS or by Pulmonary to discuss resection vs biopsy.

## 2016-07-18 ENCOUNTER — Encounter (HOSPITAL_COMMUNITY)
Admission: RE | Admit: 2016-07-18 | Discharge: 2016-07-18 | Disposition: A | Discharge status: Home / Self Care | Payer: Medicare Other | Source: Ambulatory Visit | Attending: Emergency Medicine | Admitting: Emergency Medicine

## 2016-07-18 DIAGNOSIS — R911 Solitary pulmonary nodule: Secondary | ICD-10-CM | POA: Diagnosis present

## 2016-07-18 LAB — GLUCOSE, CAPILLARY: Glucose-Capillary: 115 mg/dL — ABNORMAL HIGH (ref 65–99)

## 2016-07-18 MED ORDER — FLUDEOXYGLUCOSE F - 18 (FDG) INJECTION: 6.8000 | Freq: Once | INTRAVENOUS | Status: DC | PRN

## 2016-07-19 ENCOUNTER — Telehealth: Payer: Self-pay | Admitting: Acute Care

## 2016-07-19 ENCOUNTER — Other Ambulatory Visit: Payer: Self-pay | Admitting: Acute Care

## 2016-07-19 DIAGNOSIS — F17210 Nicotine dependence, cigarettes, uncomplicated: Secondary | ICD-10-CM

## 2016-07-19 DIAGNOSIS — R911 Solitary pulmonary nodule: Secondary | ICD-10-CM

## 2016-07-19 DIAGNOSIS — IMO0001 Reserved for inherently not codable concepts without codable children: Secondary | ICD-10-CM

## 2016-07-19 NOTE — Telephone Encounter (Signed)
I have called the results of the PET scan to the patient. I explained to her that the scan indicated uptake with SUV of 3.2 in the right upper lobe nodule. I explained that there was no evidence of hypermetabolic mediastinal or hilar lymphadenopathy. The patient verbalized understanding of the above. I have told her next step would be to schedule her for pulmonary function tests, and to see the thoracic surgeon. I discussed that often times removing the nodule is the best option. I explained that we will do the pulmonary function tests to evaluate the health of her lungs to tolerate surgery. She verbalized understanding. I have faxed a copy of both the CT scan and the PET scan results to her PCP. Patient told me she has recently had a biopsy on her foot. She will forward these results to her dermatologist who performed biopsy. She states she does have a positive [place] 1 gene. I have placed an order for both referral, and pulmonary function tests. Patient has my contact information in the event she has any further questions or concerns regarding her plan. She had no further questions at completion of the call.

## 2016-07-21 NOTE — Telephone Encounter (Signed)
See phone note 07/19/16.  This message will be closed.

## 2016-07-25 ENCOUNTER — Ambulatory Visit (INDEPENDENT_AMBULATORY_CARE_PROVIDER_SITE_OTHER): Payer: Medicare Other | Admitting: Emergency Medicine

## 2016-07-25 DIAGNOSIS — F17210 Nicotine dependence, cigarettes, uncomplicated: Secondary | ICD-10-CM

## 2016-07-25 LAB — PULMONARY FUNCTION TEST
DL/VA % pred: 67 %
DL/VA: 3.17 ml/min/mmHg/L
DLCO COR % PRED: 62 %
DLCO UNC: 14.79 ml/min/mmHg
DLCO cor: 14.41 ml/min/mmHg
DLCO unc % pred: 64 %
FEF 25-75 POST: 2.41 L/s
FEF 25-75 Pre: 1.56 L/sec
FEF2575-%Change-Post: 54 %
FEF2575-%PRED-PRE: 81 %
FEF2575-%Pred-Post: 125 %
FEV1-%Change-Post: 7 %
FEV1-%Pred-Post: 100 %
FEV1-%Pred-Pre: 93 %
FEV1-POST: 2.24 L
FEV1-Pre: 2.08 L
FEV1FVC-%Change-Post: 2 %
FEV1FVC-%Pred-Pre: 98 %
FEV6-%Change-Post: 5 %
FEV6-%PRED-POST: 102 %
FEV6-%Pred-Pre: 97 %
FEV6-POST: 2.88 L
FEV6-Pre: 2.73 L
FEV6FVC-%Change-Post: 0 %
FEV6FVC-%PRED-POST: 103 %
FEV6FVC-%Pred-Pre: 102 %
FVC-%Change-Post: 4 %
FVC-%Pred-Post: 98 %
FVC-%Pred-Pre: 94 %
FVC-Post: 2.89 L
FVC-Pre: 2.77 L
PRE FEV1/FVC RATIO: 75 %
PRE FEV6/FVC RATIO: 99 %
Post FEV1/FVC ratio: 77 %
Post FEV6/FVC ratio: 100 %
RV % pred: 97 %
RV: 2.04 L
TLC % pred: 95 %
TLC: 4.7 L

## 2016-07-25 NOTE — Progress Notes (Signed)
PFT done today. 

## 2016-07-27 ENCOUNTER — Encounter: Payer: Medicare Other | Admitting: Thoracic Surgery (Cardiothoracic Vascular Surgery)

## 2016-08-01 ENCOUNTER — Encounter: Payer: Self-pay | Admitting: Thoracic Surgery (Cardiothoracic Vascular Surgery)

## 2016-08-01 ENCOUNTER — Institutional Professional Consult (permissible substitution) (INDEPENDENT_AMBULATORY_CARE_PROVIDER_SITE_OTHER): Payer: Medicare Other | Admitting: Thoracic Surgery (Cardiothoracic Vascular Surgery)

## 2016-08-01 VITALS — BP 120/67 | HR 85 | Resp 16 | Ht 62.5 in | Wt 136.0 lb

## 2016-08-01 DIAGNOSIS — D381 Neoplasm of uncertain behavior of trachea, bronchus and lung: Secondary | ICD-10-CM

## 2016-08-01 NOTE — Progress Notes (Signed)
PCP is Corrington, Delsa Grana, MD Referring Provider is Magdalen Spatz, NP  Chief Complaint  Patient presents with  . Lung Lesion    RULobe...CTCHEST 07/13/16, PET 07/18/16, PFT 07/25/16    HPI: 66 year old woman sent for consultation regarding a right upper lobe lung lesion.  Mrs. Marchiano is a 65 year old woman with a past history of breast cancer (bilateral mastectomy in 2001), tobacco abuse (40 pack years), anxiety, anemia, arthritis, attention deficit disorder, CDH 1 gene mutation, and recently diagnosed with polyarteritis nodosa. She has been followed by Dr. Benay Spice. He referred her for CT screening for lung cancer due to smoking history in July 2017. She had a CT in February 2017 which did not show any suspicious lesions. Therefore, she had another CT in February of this year which showed a new mixed solid/sub-solid right upper lobe lesion. Because of the dramatic change from the CT of the year earlier was recommended she have a repeat CT in 3 months. The lesion persisted. She then had a PET/CT which showed it was mildly active within SUV of 3.2.  She still smokes about one half pack of cigarettes daily. She has not had any change in appetite or weight loss. She has sleep apnea and uses CPAP. She denies cough, wheezing, and shortness of breath. She denies any chest pain, pressure, or tightness. She denies hemoptysis. She does have chronic arthritis particularly in her hands and hips sometimes her knees. She also has chronic back pain. No evidence of changed recently. She did have a fall on her right chest several months ago and broke several ribs.  Zubrod Score: At the time of surgery this patient's most appropriate activity status/level should be described as: [x]     0    Normal activity, no symptoms []     1    Restricted in physical strenuous activity but ambulatory, able to do out light work []     2    Ambulatory and capable of self care, unable to do work activities, up and about >50 % of waking  hours                              []     3    Only limited self care, in bed greater than 50% of waking hours []     4    Completely disabled, no self care, confined to bed or chair []     5    Moribund   Past Medical History:  Diagnosis Date  . ADD (attention deficit disorder with hyperactivity)   . ADHD (attention deficit hyperactivity disorder)   . Anemia   . Anxiety   . Arthritis   . Breast cancer ([place])   . Bursitis of hip    bilaterally  . Chronic lower back pain    "radiates down my legs"  . Colon polyps   . Depression   . Disc degeneration   . Diverticulosis   . GERD (gastroesophageal reflux disease)   . H/O hiatal hernia   . History of bronchitis   . History of chemotherapy 2001  . Insomnia   . Internal hemorrhoid   . Pneumonia 06/22/11;    "first time"  . Respiratory distress fall 2012   "not on ventilator"    Past Surgical History:  Procedure Laterality Date  . ABDOMINAL HYSTERECTOMY  1976   "partial"  . APPENDECTOMY  1977  . BREAST BIOPSY  2001   right  .  CHOLECYSTECTOMY  1971  . MASTECTOMY  2001   Bilateral  . ORIF SHOULDER FRACTURE  06/2006   left  . PORT-A-CATH REMOVAL  2001   right chest; "for chemo"  . SHOULDER SURGERY  10/2006   left; "put pins in it"  . TOTAL KNEE ARTHROPLASTY  12/2006   right  . TOTAL SHOULDER REPLACEMENT      Family History  Problem Relation Age of Onset  . Heart disease Father        MI at age 39  . Gastric cancer Father 84  . Hypertension Mother   . Diabetes Mother   . Colon polyps Mother        approx 2-3  . Aneurysm Paternal Grandmother 69  . Stroke Maternal Grandfather        d. 63s  . Breast cancer Sister 73       negative for familial CDH1 mutation  . Breast cancer Sister 77       negative for familial CDH1 mutation  . Breast cancer Sister 61       s/p mastectomy  . Other Sister        +CDH1 mutation  . Colon polyps Sister        106+  . Liver cancer Sister 36  . Gastric cancer Paternal Aunt 46        s/p partial gastrectomy  . Breast cancer Other 37       niece; negative for familial CDH1 mutation  . Other Other 21       nephew d. due to congenital birth defect (affecting brain)  . Aneurysm Maternal Uncle        multiple maternal uncles d. due to brain aneurysms, <50  . Lymphoma Cousin 4       maternal 1st cousin  . Breast cancer Cousin        maternal 1st cousin dx. in her 31s  . Breast cancer Other        maternal great aunt (MGF's sister) d. 40s; s/p mastectomy  . Diabetes Other     Social History Social History  Substance Use Topics  . Smoking status: Current Every Day Smoker    Packs/day: 1.00    Years: 42.00    Types: Cigarettes  . Smokeless tobacco: Never Used     Comment: 1 ppd x 30 years/ 1/2 PPD x 21 years  . Alcohol use 1.2 oz/week    2 Cans of beer per week    Current Outpatient Prescriptions  Medication Sig Dispense Refill  . albuterol (PROVENTIL HFA;VENTOLIN HFA) 108 (90 BASE) MCG/ACT inhaler Inhale 2 puffs into the lungs every 6 (six) hours as needed. For shortness of breath and wheezing     . amphetamine-dextroamphetamine (ADDERALL) 20 MG tablet Take 20 mg by mouth daily.    . celecoxib (CELEBREX) 100 MG capsule Take 100 mg by mouth 2 (two) times daily.      . Cholecalciferol (VITAMIN D3) 400 units CAPS Take 400 Units by mouth daily.    . fluticasone (FLONASE) 50 MCG/ACT nasal spray Place 1 spray into both nostrils daily.      Marland Kitchen gabapentin (NEURONTIN) 300 MG capsule 600 mg daily as needed. Two tabs at night    . Glucosamine-Chondroitin (COSAMIN DS PO) Take 1 tablet by mouth daily.      . Multiple Vitamin (MULITIVITAMIN WITH MINERALS) TABS Take 1 tablet by mouth daily.    Marland Kitchen oxyCODONE (ROXICODONE) 15 MG immediate release tablet Take 15 mg  by mouth 4 (four) times daily.     . polyethylene glycol powder (GLYCOLAX/MIRALAX) powder Take 17 g by mouth daily as needed. constipation    . Respiratory Therapy Supplies DEVI CPAP @@ hs    . sertraline (ZOLOFT) 100 MG  tablet Take 100 mg by mouth 2 (two) times daily.    . traZODone (DESYREL) 100 MG tablet Take 100 mg by mouth at bedtime.     No current facility-administered medications for this visit.     Allergies  Allergen Reactions  . Ivp Dye [Iodinated Diagnostic Agents] Hives and Shortness Of Breath  . Naproxen Sodium Hives    Review of Systems  Constitutional: Negative for activity change, appetite change, chills, fever and unexpected weight change.  HENT: Negative for trouble swallowing and voice change.   Eyes: Negative for visual disturbance.  Respiratory: Positive for apnea. Negative for cough, shortness of breath and wheezing.   Cardiovascular: Negative for chest pain and palpitations.  Gastrointestinal: Positive for abdominal pain (Reflux) and constipation (Occasional).  Genitourinary: Negative for difficulty urinating and dysuria.  Musculoskeletal: Positive for arthralgias, back pain and myalgias.  Neurological: Negative for syncope, speech difficulty, weakness and headaches.  Hematological: Negative for adenopathy. Does not bruise/bleed easily.  Psychiatric/Behavioral: Positive for dysphoric mood. The patient is nervous/anxious.   All other systems reviewed and are negative.   BP 120/67 (BP Location: Right Arm, Patient Position: Sitting, Cuff Size: Normal)   Pulse 85   Resp 16   Ht 5' 2.5" (1.588 m)   Wt 136 lb (61.7 kg)   SpO2 93% Comment: ON RA  BMI 24.48 kg/m  Physical Exam  Constitutional: She is oriented to person, place, and time. She appears well-developed and well-nourished. No distress.  HENT:  Head: Normocephalic and atraumatic.  Eyes: Conjunctivae and EOM are normal. No scleral icterus.  Neck: Neck supple. No thyromegaly present.  Cardiovascular: Normal rate, regular rhythm, normal heart sounds and intact distal pulses.  Exam reveals no gallop and no friction rub.   No murmur heard. Pulmonary/Chest: Effort normal and breath sounds normal. No respiratory distress.  She has no wheezes. She has no rales.  Abdominal: Soft. She exhibits no distension. There is no tenderness.  Musculoskeletal: She exhibits no edema.  Right arm in sling due to recent shoulder injury  Lymphadenopathy:    She has no cervical adenopathy.  Neurological: She is alert and oriented to person, place, and time. No cranial nerve deficit.  Motor grossly intact  Skin: Skin is warm and dry.  Vitals reviewed.    Diagnostic Tests: CHEST WITHOUT CONTRAST LOW-DOSE FOR LUNG CANCER SCREENING  TECHNIQUE: Multidetector CT imaging of the chest was performed following the standard protocol without IV contrast.  COMPARISON:  06/22/2011 diagnostic CT. Outside CT of 03/30/2015 (report not available). No prior screening CT.  FINDINGS: Cardiovascular: Aortic and branch vessel atherosclerosis. Normal heart size, without pericardial effusion. Multivessel coronary artery atherosclerosis.  Mediastinum/Nodes: No mediastinal or definite hilar adenopathy, given limitations of unenhanced CT.  Lungs/Pleura: No pleural fluid. Isolated inferior right lower lobe part solid pulmonary lesion is identified. The sub solid component measures volume derived equivalent diameter 18.0 mm. The solid component measures volume derived equivalent diameter 14.5 mm. Example image 146/series 3. This is new since the outside CT of 03/30/2015.  Upper Abdomen: Normal imaged portions of the liver, spleen, stomach, pancreas, adrenal glands, left kidney. Too small to characterize upper pole right renal lesion is most likely a cyst. Incompletely imaged fat containing ventral abdominal wall hernia  is tiny.  Musculoskeletal: Left shoulder arthroplasty. No acute osseous abnormality.  IMPRESSION: 1. Lung-RADS Category 4A, suspicious. Follow up low-dose chest CT without contrast in 3 months (please use the following order, "CT CHEST LCS NODULE FOLLOW-UP W/O CM") is recommended. Inferior right upper lobe part  solid pulmonary lesion is indeterminate. Although this could represent primary bronchogenic carcinoma, development since the outside diagnostic CT of 03/30/2015 suggests infection as an alternate explanation. 2.  Coronary artery atherosclerosis. Aortic atherosclerosis. These results will be called to the ordering clinician or representative by the Radiologist Assistant, and communication documented in the PACS or zVision Dashboard.   Electronically Signed   By: Abigail Miyamoto M.D.   On: 04/12/2016 13:09 NUCLEAR MEDICINE PET SKULL BASE TO THIGH  TECHNIQUE: 6.8 mCi F-18 FDG was injected intravenously. Full-ring PET imaging was performed from the skull base to thigh after the radiotracer. CT data was obtained and used for attenuation correction and anatomic localization.  FASTING BLOOD GLUCOSE:  Value: 115 mg/dl  COMPARISON:  CT chest 07/03/2016  FINDINGS: NECK  No hypermetabolic lymph nodes in the neck.  CHEST  2.8 cm sub solid nodule in the posterior right upper lobe shows low level FDG uptake with SUV max = 3.2.  No evidence for hypermetabolic mediastinal or hilar lymphadenopathy.  Heart is upper normal. Coronary artery and thoracic aortic atherosclerosis evident.  ABDOMEN/PELVIS  No abnormal hypermetabolic activity within the liver, pancreas, adrenal glands, or spleen. No hypermetabolic lymph nodes in the abdomen or pelvis.  9 mm exophytic lesion upper pole right kidney has attenuation higher than would be expected for a simple cyst. This is homogeneous on today's study and the lesion smaller than 03/06/2012 CT scan when it measured 15 mm.  SKELETON  Focal uptake in the right anterolateral fourth, fifth, and sixth ribs compatible with healing fractures visible at these locations on CT imaging.  IMPRESSION: 1. Sub solid right upper lobe pulmonary nodule shows low level hypermetabolism, concerning for neoplasm. 2. No evidence for  hypermetabolic metastatic disease in the neck, chest, abdomen, or pelvis. 3. 9 mm exophytic lesion upper pole right kidney probably a cyst complicated by proteinaceous debris or hemorrhage. No hypermetabolism on PET imaging today.   Electronically Signed   By: Misty Stanley M.D.   On: 07/18/2016 14:30  Pulmonary function testing FVC 2.77 (94%) FEV1 2.08 (93%)/ 2.24 (100%) post bronchodilator TLC 4.70 (95%) DLCO 14.41 (62%)  Impression: Mrs. Rose is a 66 year old woman with a past history of tobacco abuse, breast cancer and newly diagnosed polyarteritis nodosa, as well as numerous other issues.  She had a screening CT done in February which showed a new mixed solid and sub-solid right upper lobe nodule. This was a new finding from a CT at an outside hospital done a year prior. A follow-up CT in May showed the lesion was slightly larger. Of interest in the meantime she was also diagnosed with polyarteritis nodosa. A PET CT showed the nodule was mildly hypermetabolic with an SUV of 3.2.  Given her smoking history is new primary bronchogenic carcinoma is certainly a consideration. This would be relatively rapid growth, but that is not unheard of. It is also possible that this is a manifestation of polyarteritis nodosa or some other inflammatory process. Infection is also a consideration, even though she does not have any systemic symptoms of infection.  I discussed 3 possible options with Mrs. Saulsbury and her husband. First would be continued radiographic observation. I do not favor this given the interval  growth and positive PET/CT. The second option would be navigational bronchoscopy for transbronchial biopsy. This could potentially be diagnostic and avoid the need for surgery should this turned out to be inflammatory. She would need continued radiographic follow-up afterwards. There also is a possibility this could be nondiagnostic. The final option would be to proceed with right VATS for  wedge resection and possible segmentectomy or lobectomy depending on intraoperative findings. Of the 3 she strongly favors, and really only would consider, wedge resection.  We discussed the possibility of right VATS, wedge resection, possible segmentectomy or lobectomy and frozen section positive for cancer. I informed her of the general nature of the procedure, the incisions to be used, the need for general anesthesia, the use of a chest tube postoperatively, the expected hospital stay, and the overall recovery. I informed them of the indications, risks, benefits, and alternatives. They understand the risk include, but are not limited to death, MI, DVT, PE, bleeding, possible need for transfusion, infection, prolonged air leak, cardiac arrhythmias, as well as the possibility of other unforeseeable complications.  She strongly wishes to proceed with surgical resection. She would like to wait until after July 4.  Plan: Right VATS, wedge resection, possible segmentectomy or lobectomy on Wednesday, 08/24/2016  Melrose Nakayama, MD [facility] [phone]

## 2016-08-02 ENCOUNTER — Other Ambulatory Visit: Payer: Self-pay | Admitting: *Deleted

## 2016-08-02 DIAGNOSIS — R911 Solitary pulmonary nodule: Secondary | ICD-10-CM

## 2016-08-11 ENCOUNTER — Encounter (HOSPITAL_COMMUNITY): Payer: Self-pay

## 2016-08-22 NOTE — Pre-Procedure Instructions (Signed)
Lindsey Rose  08/22/2016      [facility] - [place], [place] - [address] [place] [address] Phone: [phone] Fax: [phone]    Your procedure is scheduled on August 25, 2016  Report to [facility] Admitting Entrance "A" at 5:30 A.M.  Call this number if you have problems the morning of surgery:  [phone]   Remember:  Do not eat food or drink liquids after midnight on August 24, 2016  Take these medicines the morning of surgery with A SIP OF WATER:   BuPROPion (WELLBUTRIN XL), Sertraline (ZOLOFT), and Fluticasone (FLONASE). If needed Gabapentin (NEURONTIN), OxyCODONE (ROXICODONE), Albuterol inhaler, bring inhaler and CPAP mask with you the day of surgery. Stop taking now all Aspirins, Vitamins, Fish oils, and Herbal medicaitons. Also all NSAIDS i.e. Advil, Motrin, Aleve, Anaprox, Naproxen, BC and Goody Powders,celecoxib (CELEBREX).  Do not wear jewelry, make-up or nail polish.  Do not wear lotions, powders, or perfumes, or deoderant.  Do not shave 48 hours prior to surgery.    Do not bring valuables to the hospital.  [facility] is not responsible for any belongings or valuables.  Contacts, dentures or bridgework may not be worn into surgery.  Leave your suitcase in the car.  After surgery it may be brought to your room.  For patients admitted to the hospital, discharge time will be determined by your treatment team.  Special instructions:    [facility]- Preparing For Surgery  Before surgery, you can play an important role. Because skin is not sterile, your skin needs to be as free of germs as possible. You can reduce the number of germs on your skin by washing with CHG (chlorahexidine gluconate) Soap before surgery.  CHG is an antiseptic cleaner which kills germs and bonds with the skin to continue killing germs even after washing.  Please do not use if you have an allergy to CHG or antibacterial soaps. If your skin becomes  reddened/irritated stop using the CHG.  Do not shave (including legs and underarms) for at least 48 hours prior to first CHG shower. It is OK to shave your face.  Please follow these instructions carefully.   1. Shower the NIGHT BEFORE SURGERY and the MORNING OF SURGERY with CHG.   2. If you chose to wash your hair, wash your hair first as usual with your normal shampoo.  3. After you shampoo, rinse your hair and body thoroughly to remove the shampoo.  4. Use CHG as you would any other liquid soap. You can apply CHG directly to the skin and wash gently with a scrungie or a clean washcloth.   5. Apply the CHG Soap to your body ONLY FROM THE NECK DOWN.  Do not use on open wounds or open sores. Avoid contact with your eyes, ears, mouth and genitals (private parts). Wash genitals (private parts) with your normal soap.  6. Wash thoroughly, paying special attention to the area where your surgery will be performed.  7. Thoroughly rinse your body with warm water from the neck down.  8. DO NOT shower/wash with your normal soap after using and rinsing off the CHG Soap.  9. Pat yourself dry with a CLEAN TOWEL.   10. Wear CLEAN PAJAMAS   11. Place CLEAN SHEETS on your bed the night of your first shower and DO NOT SLEEP WITH PETS.  Day of Surgery: Do not apply any deodorants/lotions. Please wear clean clothes to the  hospital/surgery center.    Please read over the following fact sheets that you were given. Pain Booklet, Coughing and Deep Breathing, MRSA Information and Surgical Site Infection Prevention

## 2016-08-23 ENCOUNTER — Other Ambulatory Visit: Payer: Self-pay

## 2016-08-23 ENCOUNTER — Encounter (HOSPITAL_COMMUNITY)
Admission: RE | Admit: 2016-08-23 | Discharge: 2016-08-23 | Disposition: A | Discharge status: Home / Self Care | Payer: Medicare Other | Source: Ambulatory Visit | Attending: Thoracic Surgery (Cardiothoracic Vascular Surgery) | Admitting: Thoracic Surgery (Cardiothoracic Vascular Surgery)

## 2016-08-23 ENCOUNTER — Ambulatory Visit (HOSPITAL_COMMUNITY)
Admission: RE | Admit: 2016-08-23 | Discharge: 2016-08-23 | Disposition: A | Discharge status: Home / Self Care | Payer: Medicare Other | Source: Ambulatory Visit | Attending: Thoracic Surgery (Cardiothoracic Vascular Surgery) | Admitting: Thoracic Surgery (Cardiothoracic Vascular Surgery)

## 2016-08-23 ENCOUNTER — Encounter (HOSPITAL_COMMUNITY): Payer: Self-pay

## 2016-08-23 DIAGNOSIS — J189 Pneumonia, unspecified organism: Secondary | ICD-10-CM | POA: Insufficient documentation

## 2016-08-23 DIAGNOSIS — J9601 Acute respiratory failure with hypoxia: Secondary | ICD-10-CM | POA: Insufficient documentation

## 2016-08-23 DIAGNOSIS — R001 Bradycardia, unspecified: Secondary | ICD-10-CM | POA: Insufficient documentation

## 2016-08-23 DIAGNOSIS — I951 Orthostatic hypotension: Secondary | ICD-10-CM | POA: Insufficient documentation

## 2016-08-23 DIAGNOSIS — Z01812 Encounter for preprocedural laboratory examination: Secondary | ICD-10-CM | POA: Insufficient documentation

## 2016-08-23 DIAGNOSIS — I959 Hypotension, unspecified: Secondary | ICD-10-CM

## 2016-08-23 DIAGNOSIS — M545 Low back pain: Secondary | ICD-10-CM | POA: Insufficient documentation

## 2016-08-23 DIAGNOSIS — R911 Solitary pulmonary nodule: Secondary | ICD-10-CM | POA: Insufficient documentation

## 2016-08-23 DIAGNOSIS — Z853 Personal history of malignant neoplasm of breast: Secondary | ICD-10-CM

## 2016-08-23 DIAGNOSIS — Z01810 Encounter for preprocedural cardiovascular examination: Secondary | ICD-10-CM | POA: Insufficient documentation

## 2016-08-23 DIAGNOSIS — N390 Urinary tract infection, site not specified: Secondary | ICD-10-CM

## 2016-08-23 DIAGNOSIS — Z01818 Encounter for other preprocedural examination: Secondary | ICD-10-CM

## 2016-08-23 DIAGNOSIS — J449 Chronic obstructive pulmonary disease, unspecified: Secondary | ICD-10-CM

## 2016-08-23 DIAGNOSIS — G8929 Other chronic pain: Secondary | ICD-10-CM

## 2016-08-23 HISTORY — DX: Headache: R51

## 2016-08-23 HISTORY — DX: Solitary pulmonary nodule: R91.1

## 2016-08-23 HISTORY — DX: Sleep apnea, unspecified: G47.30

## 2016-08-23 HISTORY — DX: Headache, unspecified: R51.9

## 2016-08-23 LAB — COMPREHENSIVE METABOLIC PANEL
ALT: 15 U/L (ref 14–54)
AST: 22 U/L (ref 15–41)
Albumin: 3.6 g/dL (ref 3.5–5.0)
Alkaline Phosphatase: 65 U/L (ref 38–126)
Anion gap: 7 (ref 5–15)
BUN: 15 mg/dL (ref 6–20)
CALCIUM: 8.8 mg/dL — AB (ref 8.9–10.3)
CHLORIDE: 106 mmol/L (ref 101–111)
CO2: 21 mmol/L — AB (ref 22–32)
Creatinine, Ser: 0.76 mg/dL (ref 0.44–1.00)
GFR calc non Af Amer: >60 mL/min (ref >60)
GLUCOSE: 103 mg/dL — AB (ref 65–99)
Potassium: 4.1 mmol/L (ref 3.5–5.1)
SODIUM: 134 mmol/L — AB (ref 135–145)
Total Bilirubin: 0.5 mg/dL (ref 0.3–1.2)
Total Protein: 6.2 g/dL — ABNORMAL LOW (ref 6.5–8.1)

## 2016-08-23 LAB — URINALYSIS, ROUTINE W REFLEX MICROSCOPIC
Glucose, UA: NEGATIVE mg/dL
Hgb urine dipstick: NEGATIVE
Ketones, ur: NEGATIVE mg/dL
Leukocytes, UA: NEGATIVE
Nitrite: NEGATIVE
PH: 5 (ref 5.0–8.0)
PROTEIN: 30 mg/dL — AB
Specific Gravity, Urine: 1.026 (ref 1.005–1.030)

## 2016-08-23 LAB — CBC
HCT: 37.2 % (ref 36.0–46.0)
HEMOGLOBIN: 12 g/dL (ref 12.0–15.0)
MCH: 29.5 pg (ref 26.0–34.0)
MCHC: 32.3 g/dL (ref 30.0–36.0)
MCV: 91.4 fL (ref 78.0–100.0)
Platelets: 188 10*3/uL (ref 150–400)
RBC: 4.07 MIL/uL (ref 3.87–5.11)
RDW: 14.8 % (ref 11.5–15.5)
WBC: 6.7 10*3/uL (ref 4.0–10.5)

## 2016-08-23 LAB — BLOOD GAS, ARTERIAL
ACID-BASE DEFICIT: 0.6 mmol/L (ref 0.0–2.0)
Bicarbonate: 23.6 mmol/L (ref 20.0–28.0)
DRAWN BY: 470591
FIO2: 0.21
O2 SAT: 96.6 %
PCO2 ART: 38.7 mmHg (ref 32.0–48.0)
Patient temperature: 98.6
pH, Arterial: 7.401 (ref 7.350–7.450)
pO2, Arterial: 85 mmHg (ref 83.0–108.0)

## 2016-08-23 LAB — TYPE AND SCREEN
ABO/RH(D): A NEG
Antibody Screen: NEGATIVE

## 2016-08-23 LAB — PROTIME-INR
INR: 0.96
Prothrombin Time: 12.8 seconds (ref 11.4–15.2)

## 2016-08-23 LAB — SURGICAL PCR SCREEN
MRSA, PCR: NEGATIVE
STAPHYLOCOCCUS AUREUS: NEGATIVE

## 2016-08-23 LAB — APTT: aPTT: 33 seconds (ref 24–36)

## 2016-08-23 LAB — ABO/RH: ABO/RH(D): A NEG

## 2016-08-23 NOTE — Progress Notes (Signed)
PCP - Kip Corrington Cardiologist - Denies  Chest x-ray - 08/23/16 EKG - 08/23/16 Stress Test - Denies ECHO - Denies Cardiac Cath - Denies  Sleep Study - Yes CPAP - Yes- Pt unaware of settings. Pt asked to bring her mask the day of surgery.   Pt denies having chest pain, sob, or fever at this time. All instructions explained to the pt, with a verbal understanding of the material. Pt agrees to go over the instructions while at home for a better understanding. The opportunity to ask questions was provided.

## 2016-08-25 ENCOUNTER — Inpatient Hospital Stay (HOSPITAL_COMMUNITY): Payer: Medicare Other

## 2016-08-25 ENCOUNTER — Inpatient Hospital Stay (HOSPITAL_COMMUNITY): Payer: Medicare Other | Admitting: Certified Registered Nurse Anesthetist

## 2016-08-25 ENCOUNTER — Encounter (HOSPITAL_COMMUNITY)
Admission: RE | Disposition: A | Discharge status: Home / Self Care | Payer: Self-pay | Source: Ambulatory Visit | Attending: Thoracic Surgery (Cardiothoracic Vascular Surgery)

## 2016-08-25 ENCOUNTER — Inpatient Hospital Stay (HOSPITAL_COMMUNITY)
Admission: RE | Admit: 2016-08-25 | Discharge: 2016-08-29 | DRG: 164 | Disposition: A | Discharge status: Home / Self Care | Payer: Medicare Other | Source: Ambulatory Visit | Attending: Thoracic Surgery (Cardiothoracic Vascular Surgery) | Admitting: Thoracic Surgery (Cardiothoracic Vascular Surgery)

## 2016-08-25 ENCOUNTER — Encounter (HOSPITAL_COMMUNITY): Payer: Self-pay | Admitting: *Deleted

## 2016-08-25 DIAGNOSIS — M19042 Primary osteoarthritis, left hand: Secondary | ICD-10-CM | POA: Diagnosis present

## 2016-08-25 DIAGNOSIS — F419 Anxiety disorder, unspecified: Secondary | ICD-10-CM | POA: Diagnosis present

## 2016-08-25 DIAGNOSIS — Z8249 Family history of ischemic heart disease and other diseases of the circulatory system: Secondary | ICD-10-CM

## 2016-08-25 DIAGNOSIS — M160 Bilateral primary osteoarthritis of hip: Secondary | ICD-10-CM | POA: Diagnosis present

## 2016-08-25 DIAGNOSIS — Z9221 Personal history of antineoplastic chemotherapy: Secondary | ICD-10-CM | POA: Diagnosis not present

## 2016-08-25 DIAGNOSIS — M170 Bilateral primary osteoarthritis of knee: Secondary | ICD-10-CM | POA: Diagnosis present

## 2016-08-25 DIAGNOSIS — Z853 Personal history of malignant neoplasm of breast: Secondary | ICD-10-CM | POA: Diagnosis not present

## 2016-08-25 DIAGNOSIS — J449 Chronic obstructive pulmonary disease, unspecified: Secondary | ICD-10-CM | POA: Diagnosis present

## 2016-08-25 DIAGNOSIS — Z91041 Radiographic dye allergy status: Secondary | ICD-10-CM

## 2016-08-25 DIAGNOSIS — Z9689 Presence of other specified functional implants: Secondary | ICD-10-CM

## 2016-08-25 DIAGNOSIS — Z803 Family history of malignant neoplasm of breast: Secondary | ICD-10-CM

## 2016-08-25 DIAGNOSIS — Z807 Family history of other malignant neoplasms of lymphoid, hematopoietic and related tissues: Secondary | ICD-10-CM

## 2016-08-25 DIAGNOSIS — Z9013 Acquired absence of bilateral breasts and nipples: Secondary | ICD-10-CM

## 2016-08-25 DIAGNOSIS — G4730 Sleep apnea, unspecified: Secondary | ICD-10-CM | POA: Diagnosis present

## 2016-08-25 DIAGNOSIS — R911 Solitary pulmonary nodule: Secondary | ICD-10-CM | POA: Diagnosis present

## 2016-08-25 DIAGNOSIS — M19041 Primary osteoarthritis, right hand: Secondary | ICD-10-CM | POA: Diagnosis present

## 2016-08-25 DIAGNOSIS — Z90710 Acquired absence of both cervix and uterus: Secondary | ICD-10-CM

## 2016-08-25 DIAGNOSIS — Z800 Family history of malignant neoplasm of digestive organs: Secondary | ICD-10-CM

## 2016-08-25 DIAGNOSIS — G8929 Other chronic pain: Secondary | ICD-10-CM | POA: Diagnosis present

## 2016-08-25 DIAGNOSIS — Z79899 Other long term (current) drug therapy: Secondary | ICD-10-CM

## 2016-08-25 DIAGNOSIS — F909 Attention-deficit hyperactivity disorder, unspecified type: Secondary | ICD-10-CM | POA: Diagnosis present

## 2016-08-25 DIAGNOSIS — Z902 Acquired absence of lung [part of]: Secondary | ICD-10-CM

## 2016-08-25 DIAGNOSIS — F329 Major depressive disorder, single episode, unspecified: Secondary | ICD-10-CM | POA: Diagnosis present

## 2016-08-25 DIAGNOSIS — Z4682 Encounter for fitting and adjustment of non-vascular catheter: Secondary | ICD-10-CM

## 2016-08-25 DIAGNOSIS — Z8701 Personal history of pneumonia (recurrent): Secondary | ICD-10-CM

## 2016-08-25 DIAGNOSIS — Z8371 Family history of colonic polyps: Secondary | ICD-10-CM

## 2016-08-25 DIAGNOSIS — M545 Low back pain: Secondary | ICD-10-CM | POA: Diagnosis present

## 2016-08-25 DIAGNOSIS — Z96612 Presence of left artificial shoulder joint: Secondary | ICD-10-CM | POA: Diagnosis present

## 2016-08-25 DIAGNOSIS — K219 Gastro-esophageal reflux disease without esophagitis: Secondary | ICD-10-CM | POA: Diagnosis present

## 2016-08-25 DIAGNOSIS — Z86010 Personal history of colonic polyps: Secondary | ICD-10-CM

## 2016-08-25 DIAGNOSIS — N281 Cyst of kidney, acquired: Secondary | ICD-10-CM | POA: Diagnosis present

## 2016-08-25 DIAGNOSIS — Z9049 Acquired absence of other specified parts of digestive tract: Secondary | ICD-10-CM | POA: Diagnosis not present

## 2016-08-25 DIAGNOSIS — I951 Orthostatic hypotension: Secondary | ICD-10-CM | POA: Diagnosis present

## 2016-08-25 DIAGNOSIS — Z85118 Personal history of other malignant neoplasm of bronchus and lung: Secondary | ICD-10-CM | POA: Diagnosis not present

## 2016-08-25 DIAGNOSIS — Z886 Allergy status to analgesic agent status: Secondary | ICD-10-CM

## 2016-08-25 DIAGNOSIS — Z79891 Long term (current) use of opiate analgesic: Secondary | ICD-10-CM

## 2016-08-25 DIAGNOSIS — F17210 Nicotine dependence, cigarettes, uncomplicated: Secondary | ICD-10-CM | POA: Diagnosis present

## 2016-08-25 DIAGNOSIS — M300 Polyarteritis nodosa: Secondary | ICD-10-CM | POA: Diagnosis present

## 2016-08-25 DIAGNOSIS — R001 Bradycardia, unspecified: Secondary | ICD-10-CM | POA: Diagnosis present

## 2016-08-25 DIAGNOSIS — Z833 Family history of diabetes mellitus: Secondary | ICD-10-CM

## 2016-08-25 DIAGNOSIS — Z7951 Long term (current) use of inhaled steroids: Secondary | ICD-10-CM

## 2016-08-25 HISTORY — PX: VIDEO ASSISTED THORACOSCOPY (VATS)/WEDGE RESECTION: SHX6174

## 2016-08-25 LAB — GLUCOSE, CAPILLARY
GLUCOSE-CAPILLARY: 108 mg/dL — AB (ref 65–99)
Glucose-Capillary: 137 mg/dL — ABNORMAL HIGH (ref 65–99)
Glucose-Capillary: 105 mg/dL — ABNORMAL HIGH (ref 65–99)
Glucose-Capillary: 113 mg/dL — ABNORMAL HIGH (ref 65–99)

## 2016-08-25 SURGERY — VIDEO ASSISTED THORACOSCOPY (VATS)/WEDGE RESECTION
Anesthesia: General | Site: Chest | Laterality: Right

## 2016-08-25 MED ORDER — LIDOCAINE HCL (CARDIAC) 20 MG/ML IV SOLN
INTRAVENOUS | Status: AC
  Filled 2016-08-25: qty 5

## 2016-08-25 MED ORDER — NALOXONE HCL 0.4 MG/ML IJ SOLN: 0.4000 mg | INTRAMUSCULAR | Status: DC | PRN

## 2016-08-25 MED ORDER — OXYCODONE HCL 5 MG PO TABS
5.0000 mg | ORAL_TABLET | ORAL | Status: DC | PRN
  Administered 2016-08-25 – 2016-08-26 (×3): 5 mg via ORAL
  Filled 2016-08-25 (×3): qty 1

## 2016-08-25 MED ORDER — FENTANYL CITRATE (PF) 100 MCG/2ML IJ SOLN
INTRAMUSCULAR | Status: AC
  Filled 2016-08-25: qty 2

## 2016-08-25 MED ORDER — SERTRALINE HCL 100 MG PO TABS
200.0000 mg | ORAL_TABLET | Freq: Every day | ORAL | Status: DC
  Administered 2016-08-26 – 2016-08-29 (×4): 200 mg via ORAL
  Filled 2016-08-25 (×4): qty 2

## 2016-08-25 MED ORDER — ENOXAPARIN SODIUM 40 MG/0.4ML ~~LOC~~ SOLN
40.0000 mg | Freq: Every day | SUBCUTANEOUS | Status: DC
  Administered 2016-08-26 – 2016-08-29 (×4): 40 mg via SUBCUTANEOUS
  Filled 2016-08-25 (×4): qty 0.4

## 2016-08-25 MED ORDER — PROPOFOL 10 MG/ML IV BOLUS
INTRAVENOUS | Status: AC
  Filled 2016-08-25: qty 40

## 2016-08-25 MED ORDER — DIPHENHYDRAMINE HCL 50 MG/ML IJ SOLN: 12.5000 mg | Freq: Four times a day (QID) | INTRAMUSCULAR | Status: DC | PRN

## 2016-08-25 MED ORDER — 0.9 % SODIUM CHLORIDE (POUR BTL) OPTIME
TOPICAL | Status: DC | PRN
  Administered 2016-08-25: 2000 mL

## 2016-08-25 MED ORDER — TRAMADOL HCL 50 MG PO TABS
50.0000 mg | ORAL_TABLET | Freq: Four times a day (QID) | ORAL | Status: DC | PRN
  Administered 2016-08-26 – 2016-08-28 (×3): 100 mg via ORAL
  Filled 2016-08-25 (×3): qty 2

## 2016-08-25 MED ORDER — BUPROPION HCL ER (XL) 150 MG PO TB24
150.0000 mg | ORAL_TABLET | Freq: Every day | ORAL | Status: DC
  Administered 2016-08-26 – 2016-08-29 (×4): 150 mg via ORAL
  Filled 2016-08-25 (×4): qty 1

## 2016-08-25 MED ORDER — TRAZODONE HCL 50 MG PO TABS
100.0000 mg | ORAL_TABLET | Freq: Every day | ORAL | Status: DC
  Administered 2016-08-26 – 2016-08-28 (×3): 100 mg via ORAL
  Filled 2016-08-25 (×3): qty 2

## 2016-08-25 MED ORDER — COLCHICINE 0.6 MG PO TABS
0.6000 mg | ORAL_TABLET | Freq: Two times a day (BID) | ORAL | Status: DC
  Administered 2016-08-25 – 2016-08-29 (×8): 0.6 mg via ORAL
  Filled 2016-08-25 (×8): qty 1

## 2016-08-25 MED ORDER — FENTANYL CITRATE (PF) 250 MCG/5ML IJ SOLN
INTRAMUSCULAR | Status: AC
  Filled 2016-08-25: qty 5

## 2016-08-25 MED ORDER — ALBUTEROL SULFATE (2.5 MG/3ML) 0.083% IN NEBU: 3.0000 mL | INHALATION_SOLUTION | Freq: Four times a day (QID) | RESPIRATORY_TRACT | Status: DC | PRN

## 2016-08-25 MED ORDER — ACETAMINOPHEN 160 MG/5ML PO SOLN: 1000.0000 mg | Freq: Four times a day (QID) | ORAL | Status: DC

## 2016-08-25 MED ORDER — OXYCODONE HCL 5 MG PO TABS: 5.0000 mg | ORAL_TABLET | Freq: Once | ORAL | Status: DC | PRN

## 2016-08-25 MED ORDER — ROCURONIUM BROMIDE 50 MG/5ML IV SOLN
INTRAVENOUS | Status: AC
  Filled 2016-08-25: qty 1

## 2016-08-25 MED ORDER — AMPHETAMINE-DEXTROAMPHETAMINE 10 MG PO TABS
20.0000 mg | ORAL_TABLET | Freq: Two times a day (BID) | ORAL | Status: DC
  Administered 2016-08-26 – 2016-08-29 (×7): 20 mg via ORAL
  Filled 2016-08-25 (×7): qty 2

## 2016-08-25 MED ORDER — SUGAMMADEX SODIUM 200 MG/2ML IV SOLN
INTRAVENOUS | Status: DC | PRN
  Administered 2016-08-25: 200 mg via INTRAVENOUS

## 2016-08-25 MED ORDER — GABAPENTIN 300 MG PO CAPS
600.0000 mg | ORAL_CAPSULE | Freq: Every day | ORAL | Status: DC
  Administered 2016-08-26 – 2016-08-28 (×3): 600 mg via ORAL
  Filled 2016-08-25 (×3): qty 2

## 2016-08-25 MED ORDER — LACTATED RINGERS IV SOLN
INTRAVENOUS | Status: DC | PRN
  Administered 2016-08-25: 08:00:00 via INTRAVENOUS

## 2016-08-25 MED ORDER — FENTANYL CITRATE (PF) 100 MCG/2ML IJ SOLN
INTRAMUSCULAR | Status: DC | PRN
  Administered 2016-08-25: 25 ug via INTRAVENOUS
  Administered 2016-08-25 (×5): 50 ug via INTRAVENOUS
  Administered 2016-08-25 (×2): 100 ug via INTRAVENOUS
  Administered 2016-08-25: 25 ug via INTRAVENOUS

## 2016-08-25 MED ORDER — FENTANYL 40 MCG/ML IV SOLN
INTRAVENOUS | Status: DC
  Administered 2016-08-25: 1000 ug via INTRAVENOUS
  Administered 2016-08-25: 210 ug via INTRAVENOUS
  Administered 2016-08-25: 240 ug via INTRAVENOUS
  Administered 2016-08-26: 60 ug via INTRAVENOUS
  Administered 2016-08-26: 1000 ug via INTRAVENOUS
  Administered 2016-08-26: 570 ug via INTRAVENOUS
  Administered 2016-08-26: 1000 ug via INTRAVENOUS
  Administered 2016-08-26: 240 ug via INTRAVENOUS
  Administered 2016-08-26: 355 ug via INTRAVENOUS
  Administered 2016-08-26: 120 ug via INTRAVENOUS
  Administered 2016-08-27: 75 ug via INTRAVENOUS
  Administered 2016-08-27: 165 ug via INTRAVENOUS
  Administered 2016-08-27: 60 ug via INTRAVENOUS
  Administered 2016-08-27: 120 ug via INTRAVENOUS
  Administered 2016-08-27: 15 ug via INTRAVENOUS
  Administered 2016-08-28: 90 ug via INTRAVENOUS
  Administered 2016-08-28: 180 ug via INTRAVENOUS
  Administered 2016-08-28: 30 ug via INTRAVENOUS
  Filled 2016-08-25 (×3): qty 25

## 2016-08-25 MED ORDER — DIPHENHYDRAMINE HCL 12.5 MG/5ML PO ELIX: 12.5000 mg | ORAL_SOLUTION | Freq: Four times a day (QID) | ORAL | Status: DC | PRN

## 2016-08-25 MED ORDER — ORAL CARE MOUTH RINSE
15.0000 mL | Freq: Two times a day (BID) | OROMUCOSAL | Status: DC
Start: 2016-08-25 — End: 2016-08-26
  Administered 2016-08-25 – 2016-08-26 (×2): 15 mL via OROMUCOSAL

## 2016-08-25 MED ORDER — ALBUTEROL SULFATE HFA 108 (90 BASE) MCG/ACT IN AERS
INHALATION_SPRAY | RESPIRATORY_TRACT | Status: DC | PRN
  Administered 2016-08-25: 6 via RESPIRATORY_TRACT

## 2016-08-25 MED ORDER — ROCURONIUM BROMIDE 100 MG/10ML IV SOLN
INTRAVENOUS | Status: DC | PRN
  Administered 2016-08-25: 20 mg via INTRAVENOUS
  Administered 2016-08-25: 10 mg via INTRAVENOUS
  Administered 2016-08-25 (×2): 20 mg via INTRAVENOUS
  Administered 2016-08-25: 50 mg via INTRAVENOUS

## 2016-08-25 MED ORDER — INSULIN ASPART 100 UNIT/ML ~~LOC~~ SOLN
0.0000 [IU] | SUBCUTANEOUS | Status: DC
  Administered 2016-08-25 – 2016-08-26 (×2): 2 [IU] via SUBCUTANEOUS

## 2016-08-25 MED ORDER — OXYCODONE HCL 5 MG/5ML PO SOLN: 5.0000 mg | Freq: Once | ORAL | Status: DC | PRN

## 2016-08-25 MED ORDER — ACETAMINOPHEN 160 MG/5ML PO SOLN: 325.0000 mg | ORAL | Status: DC | PRN

## 2016-08-25 MED ORDER — DEXTROSE 5 % IV SOLN
INTRAVENOUS | Status: AC
  Filled 2016-08-25: qty 1.5

## 2016-08-25 MED ORDER — DEXTROSE-NACL 5-0.45 % IV SOLN
INTRAVENOUS | Status: DC
  Administered 2016-08-25 – 2016-08-28 (×3): via INTRAVENOUS

## 2016-08-25 MED ORDER — PHENYLEPHRINE 40 MCG/ML (10ML) SYRINGE FOR IV PUSH (FOR BLOOD PRESSURE SUPPORT)
PREFILLED_SYRINGE | INTRAVENOUS | Status: DC | PRN
  Administered 2016-08-25: 80 ug via INTRAVENOUS

## 2016-08-25 MED ORDER — BUPIVACAINE 0.5 % ON-Q PUMP SINGLE CATH 400 ML
400.0000 mL | INJECTION | Status: DC
  Filled 2016-08-25 (×2): qty 400

## 2016-08-25 MED ORDER — PHENYLEPHRINE HCL 10 MG/ML IJ SOLN
INTRAVENOUS | Status: DC | PRN
  Administered 2016-08-25: 50 ug/min via INTRAVENOUS

## 2016-08-25 MED ORDER — SUCCINYLCHOLINE CHLORIDE 200 MG/10ML IV SOSY
PREFILLED_SYRINGE | INTRAVENOUS | Status: AC
  Filled 2016-08-25: qty 10

## 2016-08-25 MED ORDER — ONDANSETRON HCL 4 MG/2ML IJ SOLN
INTRAMUSCULAR | Status: AC
  Filled 2016-08-25: qty 2

## 2016-08-25 MED ORDER — MIDAZOLAM HCL 2 MG/2ML IJ SOLN
INTRAMUSCULAR | Status: AC
  Filled 2016-08-25: qty 2

## 2016-08-25 MED ORDER — FENTANYL CITRATE (PF) 100 MCG/2ML IJ SOLN: 25.0000 ug | INTRAMUSCULAR | Status: DC | PRN

## 2016-08-25 MED ORDER — ONDANSETRON HCL 4 MG/2ML IJ SOLN
INTRAMUSCULAR | Status: DC | PRN
  Administered 2016-08-25: 4 mg via INTRAVENOUS

## 2016-08-25 MED ORDER — FENTANYL CITRATE (PF) 100 MCG/2ML IJ SOLN
25.0000 ug | INTRAMUSCULAR | Status: DC | PRN
  Administered 2016-08-25 (×2): 25 ug via INTRAVENOUS

## 2016-08-25 MED ORDER — ACETAMINOPHEN 325 MG PO TABS: 325.0000 mg | ORAL_TABLET | ORAL | Status: DC | PRN

## 2016-08-25 MED ORDER — ACETAMINOPHEN 500 MG PO TABS
1000.0000 mg | ORAL_TABLET | Freq: Four times a day (QID) | ORAL | Status: DC
  Administered 2016-08-25 – 2016-08-29 (×16): 1000 mg via ORAL
  Filled 2016-08-25 (×16): qty 2

## 2016-08-25 MED ORDER — SENNOSIDES-DOCUSATE SODIUM 8.6-50 MG PO TABS
1.0000 | ORAL_TABLET | Freq: Every day | ORAL | Status: DC
  Administered 2016-08-25 – 2016-08-26 (×2): 1 via ORAL
  Filled 2016-08-25 (×3): qty 1

## 2016-08-25 MED ORDER — ONDANSETRON HCL 4 MG/2ML IJ SOLN: 4.0000 mg | Freq: Four times a day (QID) | INTRAMUSCULAR | Status: DC | PRN

## 2016-08-25 MED ORDER — BUPIVACAINE HCL (PF) 0.5 % IJ SOLN
INTRAMUSCULAR | Status: AC
  Filled 2016-08-25: qty 10

## 2016-08-25 MED ORDER — CELECOXIB 100 MG PO CAPS
100.0000 mg | ORAL_CAPSULE | Freq: Two times a day (BID) | ORAL | Status: DC
  Administered 2016-08-25 – 2016-08-29 (×8): 100 mg via ORAL
  Filled 2016-08-25 (×8): qty 1

## 2016-08-25 MED ORDER — PROPOFOL 10 MG/ML IV BOLUS
INTRAVENOUS | Status: DC | PRN
  Administered 2016-08-25: 10 mg via INTRAVENOUS
  Administered 2016-08-25 (×3): 20 mg via INTRAVENOUS
  Administered 2016-08-25: 40 mg via INTRAVENOUS
  Administered 2016-08-25: 30 mg via INTRAVENOUS

## 2016-08-25 MED ORDER — MIDAZOLAM HCL 2 MG/2ML IJ SOLN
INTRAMUSCULAR | Status: DC | PRN
  Administered 2016-08-25 (×2): 0.5 mg via INTRAVENOUS
  Administered 2016-08-25: 1 mg via INTRAVENOUS

## 2016-08-25 MED ORDER — HEMOSTATIC AGENTS (NO CHARGE) OPTIME
TOPICAL | Status: DC | PRN
  Administered 2016-08-25: 1 via TOPICAL

## 2016-08-25 MED ORDER — SODIUM CHLORIDE 0.9% FLUSH: 9.0000 mL | INTRAVENOUS | Status: DC | PRN

## 2016-08-25 MED ORDER — DEXTROSE 5 % IV SOLN
1.5000 g | INTRAVENOUS | Status: AC
  Administered 2016-08-25: 1.5 g via INTRAVENOUS

## 2016-08-25 MED ORDER — BUPIVACAINE HCL (PF) 0.5 % IJ SOLN
INTRAMUSCULAR | Status: DC | PRN
  Administered 2016-08-25: 5 mL

## 2016-08-25 MED ORDER — POTASSIUM CHLORIDE 10 MEQ/50ML IV SOLN
10.0000 meq | Freq: Every day | INTRAVENOUS | Status: DC | PRN
  Administered 2016-08-26 (×3): 10 meq via INTRAVENOUS
  Filled 2016-08-25 (×3): qty 50

## 2016-08-25 MED ORDER — ROCURONIUM BROMIDE 50 MG/5ML IV SOLN
INTRAVENOUS | Status: AC
  Filled 2016-08-25: qty 2

## 2016-08-25 MED ORDER — DEXTROSE 5 % IV SOLN
1.5000 g | Freq: Two times a day (BID) | INTRAVENOUS | Status: AC
  Administered 2016-08-25 – 2016-08-26 (×2): 1.5 g via INTRAVENOUS
  Filled 2016-08-25 (×2): qty 1.5

## 2016-08-25 MED ORDER — BISACODYL 5 MG PO TBEC
10.0000 mg | DELAYED_RELEASE_TABLET | Freq: Every day | ORAL | Status: DC
  Administered 2016-08-25 – 2016-08-29 (×4): 10 mg via ORAL
  Filled 2016-08-25 (×4): qty 2

## 2016-08-25 MED ORDER — PREDNISONE 20 MG PO TABS
20.0000 mg | ORAL_TABLET | Freq: Every day | ORAL | Status: DC
  Administered 2016-08-26 – 2016-08-29 (×4): 20 mg via ORAL
  Filled 2016-08-25 (×4): qty 1

## 2016-08-25 MED ORDER — BUPIVACAINE ON-Q PAIN PUMP (FOR ORDER SET NO CHG)
INJECTION | Status: AC
  Filled 2016-08-25: qty 1

## 2016-08-25 SURGICAL SUPPLY — 48 items
BENZOIN TINCTURE PRP APPL 2/3 (GAUZE/BANDAGES/DRESSINGS) ×3 IMPLANT
CANISTER SUCT 3000ML PPV (MISCELLANEOUS) ×6 IMPLANT
CATH KIT ON-Q SILVERSOAK 5IN (CATHETERS) ×3 IMPLANT
CLIP TI MEDIUM 6 (CLIP) ×3 IMPLANT
CONT SPEC 4OZ CLIKSEAL STRL BL (MISCELLANEOUS) ×12 IMPLANT
DERMABOND ADVANCED (GAUZE/BANDAGES/DRESSINGS) ×1
DERMABOND ADVANCED .7 DNX12 (GAUZE/BANDAGES/DRESSINGS) ×2 IMPLANT
DRAIN CHANNEL 28F RND 3/8 FF (WOUND CARE) ×3 IMPLANT
DRAPE LAPAROSCOPIC ABDOMINAL (DRAPES) ×3 IMPLANT
DRAPE WARM FLUID 44X44 (DRAPE) ×3 IMPLANT
ELECT BLADE 6.5 EXT (BLADE) ×3 IMPLANT
ELECT REM PT RETURN 9FT ADLT (ELECTROSURGICAL) ×3
ELECTRODE REM PT RTRN 9FT ADLT (ELECTROSURGICAL) ×2 IMPLANT
FELT TEFLON 1X6 (MISCELLANEOUS) ×3 IMPLANT
GAUZE SPONGE 4X4 12PLY STRL LF (GAUZE/BANDAGES/DRESSINGS) ×3 IMPLANT
GLOVE SURG SIGNA 7.5 PF LTX (GLOVE) ×6 IMPLANT
GOWN STRL REUS W/ TWL LRG LVL3 (GOWN DISPOSABLE) ×8 IMPLANT
GOWN STRL REUS W/ TWL XL LVL3 (GOWN DISPOSABLE) ×2 IMPLANT
GOWN STRL REUS W/TWL LRG LVL3 (GOWN DISPOSABLE) ×4
GOWN STRL REUS W/TWL XL LVL3 (GOWN DISPOSABLE) ×1
HEMOSTAT SURGICEL 2X14 (HEMOSTASIS) ×3 IMPLANT
KIT BASIN OR (CUSTOM PROCEDURE TRAY) ×3 IMPLANT
KIT ROOM TURNOVER OR (KITS) ×3 IMPLANT
KIT SUCTION CATH 14FR (SUCTIONS) ×3 IMPLANT
NS IRRIG 1000ML POUR BTL (IV SOLUTION) ×6 IMPLANT
PACK CHEST (CUSTOM PROCEDURE TRAY) ×3 IMPLANT
PAD ARMBOARD 7.5X6 YLW CONV (MISCELLANEOUS) ×6 IMPLANT
SOLUTION ANTI FOG 6CC (MISCELLANEOUS) ×3 IMPLANT
SPONGE INTESTINAL PEANUT (DISPOSABLE) ×6 IMPLANT
SPONGE TONSIL 1 RF SGL (DISPOSABLE) ×3 IMPLANT
STAPLE RELOAD 45MM GOLD (STAPLE) ×45 IMPLANT
STAPLER ECHELON POWERED (MISCELLANEOUS) ×3 IMPLANT
SUT PROLENE 4 0 RB 1 (SUTURE) ×1
SUT PROLENE 4-0 RB1 .5 CRCL 36 (SUTURE) ×2 IMPLANT
SUT SILK  1 MH (SUTURE) ×2
SUT SILK 1 MH (SUTURE) ×4 IMPLANT
SUT SILK 3 0SH CR/8 30 (SUTURE) ×3 IMPLANT
SUT VIC AB 1 CTX 27 (SUTURE) ×3 IMPLANT
SUT VIC AB 2-0 CTX 36 (SUTURE) ×3 IMPLANT
SUT VIC AB 3-0 MH 27 (SUTURE) ×3 IMPLANT
SUT VIC AB 3-0 X1 27 (SUTURE) ×3 IMPLANT
SYSTEM SAHARA CHEST DRAIN ATS (WOUND CARE) ×3 IMPLANT
TAPE CLOTH SURG 4X10 WHT LF (GAUZE/BANDAGES/DRESSINGS) ×3 IMPLANT
TOWEL GREEN STERILE (TOWEL DISPOSABLE) ×3 IMPLANT
TRAY FOLEY W/METER SILVER 16FR (SET/KITS/TRAYS/PACK) ×3 IMPLANT
TROCAR XCEL BLADELESS 5X75MML (TROCAR) ×3 IMPLANT
TUNNELER SHEATH ON-Q 11GX8 DSP (PAIN MANAGEMENT) ×3 IMPLANT
WATER STERILE IRR 1000ML POUR (IV SOLUTION) ×6 IMPLANT

## 2016-08-25 NOTE — Anesthesia Procedure Notes (Addendum)
Procedure Name: Intubation Date/Time: 08/25/2016 8:11 AM Performed by: Trixie Deis A Pre-anesthesia Checklist: Patient identified, Emergency Drugs available, Suction available and Patient being monitored Patient Re-evaluated:Patient Re-evaluated prior to induction Oxygen Delivery Method: Circle System Utilized Preoxygenation: Pre-oxygenation with 100% oxygen Induction Type: IV induction Ventilation: Mask ventilation without difficulty Laryngoscope Size: Mac and 3 Grade View: Grade I Tube type: Oral Endobronchial tube: Left, EBT position confirmed by fiberoptic bronchoscope, EBT position confirmed by auscultation and Double lumen EBT and 35 Fr Number of attempts: 1 Airway Equipment and Method: Stylet and Oral airway Placement Confirmation: ETT inserted through vocal cords under direct vision,  positive ETCO2 and breath sounds checked- equal and bilateral Secured at: 31 cm Tube secured with: Tape Dental Injury: Teeth and Oropharynx as per pre-operative assessment  Comments: Intubation performed under direct supervision by Marlis Edelson SRNA

## 2016-08-25 NOTE — Interval H&P Note (Signed)
History and Physical Interval Note:  08/25/2016 7:33 AM  Lindsey Rose  has presented today for surgery, with the diagnosis of RLL NODULE  The various methods of treatment have been discussed with the patient and family. After consideration of risks, benefits and other options for treatment, the patient has consented to  Procedure(s): VIDEO ASSISTED THORACOSCOPY (VATS)/WEDGE RESECTION (Right) possible SEGMENTECTOMY (Right) possible LOBECTOMY (Right) as a surgical intervention .  The patient's history has been reviewed, patient examined, no change in status, stable for surgery.  I have reviewed the patient's chart and labs.  Questions were answered to the patient's satisfaction.     Melrose Nakayama

## 2016-08-25 NOTE — Op Note (Signed)
NAMEKIANTE, PETROVICH                 ACCOUNT NO.:  [id number]  MEDICAL RECORD NO.:  [id number]  LOCATION:  2H10C                        FACILITY:  [facility]  PHYSICIAN:  Revonda Standard. Roxan Hockey, M.D.DATE OF BIRTH:  July 30, 1950  DATE OF PROCEDURE:  08/25/2016 DATE OF DISCHARGE:                              OPERATIVE REPORT   PREOPERATIVE DIAGNOSIS:  Right upper lobe lung nodule.  POSTOPERATIVE DIAGNOSIS:  Right upper lobe lung nodule.  PROCEDURE:   Right video-assisted thoracoscopy Wedge resection of right upper lobe nodule, On-Q local anesthetic catheter placement.  SURGEON:  Revonda Standard. Roxan Hockey, M.D.  ASSISTANT:  Nicholes Rough, PA.  ANESTHESIA:  General.  FINDINGS:  Amorphous nodule, anterior inferior aspect of right upper lobe along the minor fissure.  Frozen section revealed atypical lymphoid aggregates, question lymphoma.  CLINICAL NOTE:  Mrs. Gordy is a 66 year old woman with a past history of tobacco abuse who was recently diagnosed with polyarteritis nodosa.  She was referred for CT screening for lung cancer due to her smoking history.  Her initial CT did not show any suspicious lesions, but a CT in February of this year showed a new mixed solid and subsolid right upper lobe lesion.  A repeat CT was done which showed the lesion was persistent.  PET-CT showed it was mildly active with an SUV of 3.2.  She was offered the option of transbronchial biopsy versus wedge resection. The indications, risks, and benefits of both procedures were discussed with the patient.  She wished to proceed with wedge resection.  She accepted the risks.  OPERATIVE NOTE:  Mrs. Marcucci was brought to the preoperative holding area on August 25, 2016.  Anesthesia placed an arterial blood pressure monitoring line and a central venous line.  She was taken to the operating room, anesthetized, and intubated with a double-lumen endotracheal tube.  Intravenous antibiotics were administered.  A Foley catheter  was placed.  Sequential compression devices were placed on the calves for DVT prophylaxis.  She was placed in a left lateral decubitus position and the right chest was prepped and draped in usual sterile fashion.  Single lung ventilation of the left lung was initiated and was tolerated well throughout the procedure.  After performing a time-out, an incision was made in the seventh intercostal space in the midaxillary line.  A 5-mm port was inserted into the chest and the thoracoscope was advanced into the chest.  There was good isolation of the right lung.  A working incision was made in the fourth interspace anterolaterally.  No rib spreading was performed during the procedure.  The major fissure was essentially complete except posteriorly there was a small area that was incomplete.  The minor fissure was partially complete, but approximately half of the fissure was not separated.  A rounded 1.5-cm diameter area was notable and vaguely palpable on the inferior aspect of the right upper lobe along the minor fissure anteriorly.  To perform wedge resection, the minor fissure needed to be completed. That was done with sequential firings of an endoscopic stapler.  An Echelon 45-mm stapler with gold cartridges was used.  The wedge resection was completed with multiple additional firings of the stapler.  This  was relatively difficult due to the area in the close proximity to the pulmonary artery and pulmonary veins.  The wedge was completed removing the nodule but with only a small parenchymal margin.  The specimen was sent for frozen section.  While awaiting the results of frozen section, an on- Q local anesthetic catheter was tunneled through a separate incision posteriorly and tunneled into a subpleural location.  It was primed with 5 mL of 0.5% Marcaine, it was secured to the skin with 3-0 silk suture. The frozen section returned showing no evidence of carcinoma.  There were atypical  lymphoid aggregates worrisome for lymphoma.  There were adhesions at the apex which prevented access to the paratracheal nodes but a level 7 node was visible when the lung was retracted anteriorly. The level 7 node appeared enlarged but otherwise normal.  It was removed and sent for permanent pathology as well to provide additional tissue if lymphoma workup was necessary.  Final inspection was made for hemostasis.  A 28-French chest tube was placed through the original port incision and directed to apex and secured with #1 silk suture.  The middle lobe was tacked to the lower lobe with the stapler to prevent torsion.  The lung was reinflated, there was good aeration of the right lung.  The working incision was closed with #1 Vicryl fascial suture, 2- 0 Vicryl subcutaneous suture, and 3-0 Vicryl subcuticular suture.  All sponge, needle, and instrument counts were correct at the end of the procedure.  The patient was taken from the operating room to the postanesthetic care unit in good condition.     Revonda Standard Roxan Hockey, M.D.     SCH/MEDQ  D:  08/25/2016  T:  08/25/2016  Job:  [id number]

## 2016-08-25 NOTE — Anesthesia Preprocedure Evaluation (Addendum)
Anesthesia Evaluation  Patient identified by MRN, date of birth, ID band Patient awake    Reviewed: Allergy & Precautions, NPO status , Patient's Chart, lab work & pertinent test results  History of Anesthesia Complications Negative for: history of anesthetic complications  Airway Mallampati: I  TM Distance: >3 FB Neck ROM: Full    Dental  (+) Edentulous Upper, Edentulous Lower   Pulmonary sleep apnea and Continuous Positive Airway Pressure Ventilation , COPD,  COPD inhaler, Current Smoker,    breath sounds clear to auscultation       Cardiovascular negative cardio ROS   Rhythm:Regular     Neuro/Psych  Headaches, PSYCHIATRIC DISORDERS Anxiety Depression    GI/Hepatic Neg liver ROS, GERD (meds as needed)  Controlled,  Endo/Other  negative endocrine ROSL breast Ca 2001- chemo, mastectomy. Restricted UE.   Renal/GU negative Renal ROS     Musculoskeletal  (+) Arthritis , Chronic low back pain- takes medication everyday   Abdominal   Peds  Hematology negative hematology ROS (+)   Anesthesia Other Findings   Reproductive/Obstetrics                            Anesthesia Physical Anesthesia Plan  ASA: III  Anesthesia Plan: General   Post-op Pain Management:    Induction: Intravenous  PONV Risk Score and Plan: 2 and Ondansetron and Dexamethasone  Airway Management Planned: Double Lumen EBT  Additional Equipment: Arterial line, CVP and Ultrasound Guidance Line Placement  Intra-op Plan:   Post-operative Plan: Extubation in OR  Informed Consent: I have reviewed the patients History and Physical, chart, labs and discussed the procedure including the risks, benefits and alternatives for the proposed anesthesia with the patient or authorized representative who has indicated his/her understanding and acceptance.   Dental advisory given  Plan Discussed with: CRNA and Surgeon  Anesthesia  Plan Comments:         Anesthesia Quick Evaluation

## 2016-08-25 NOTE — Anesthesia Procedure Notes (Signed)
Central Venous Catheter Insertion Performed by: Khyrie Masi, anesthesiologist Patient location: Pre-op. Preanesthetic checklist: patient identified, IV checked, site marked, risks and benefits discussed, surgical consent, monitors and equipment checked, pre-op evaluation, timeout performed and anesthesia consent Position: Trendelenburg Lidocaine 1% used for infiltration and patient sedated Hand hygiene performed , maximum sterile barriers used  and Seldinger technique used Catheter size: 8 Fr Total catheter length 16. Central line was placed.Double lumen Procedure performed using ultrasound guided technique. Ultrasound Notes:anatomy identified, needle tip was noted to be adjacent to the nerve/plexus identified, no ultrasound evidence of intravascular and/or intraneural injection and image(s) printed for medical record Attempts: 1 Following insertion, dressing applied, line sutured and Biopatch. Post procedure assessment: blood return through all ports, free fluid flow and no air  Patient tolerated the procedure well with no immediate complications.          

## 2016-08-25 NOTE — Transfer of Care (Signed)
Immediate Anesthesia Transfer of Care Note  Patient: Lindsey Rose  Procedure(s) Performed: Procedure(s): RIGHT VIDEO ASSISTED THORACOSCOPY (VATS)/RIGHT UPPER LOBE WEDGE RESECTION (Right)  Patient Location: PACU  Anesthesia Type:General  Level of Consciousness: awake, alert  and oriented  Airway & Oxygen Therapy: Patient Spontanous Breathing and Patient connected to face mask oxygen  Post-op Assessment: Report given to RN and Post -op Vital signs reviewed and stable  Post vital signs: Reviewed and stable  Last Vitals:  Vitals:   08/25/16 0600  BP: 119/63  Pulse: 83  Resp: 20  Temp: 37.1 C    Last Pain:  Vitals:   08/25/16 0600  TempSrc: Oral         Complications: No apparent anesthesia complications

## 2016-08-25 NOTE — Brief Op Note (Addendum)
08/25/2016  10:19 AM  PATIENT:  Lindsey Rose  66 y.o. female  PRE-OPERATIVE DIAGNOSIS:  Right Upper Lobe NODULE  POST-OPERATIVE DIAGNOSIS:  Right Upper Lobe NODULE  PROCEDURE:  Procedure(s): RIGHT VIDEO ASSISTED THORACOSCOPY (VATS)/RIGHT UPPER LOBE WEDGE RESECTION (Right)  SURGEON:  Surgeon(s) and Role:    * Melrose Nakayama, MD - Primary  PHYSICIAN ASSISTANT:   Nicholes Rough, PA-C   ANESTHESIA:   general  EBL:  Total I/O In: 800 [I.V.:800] Out: 250 [Urine:200; Blood:50]  BLOOD ADMINISTERED:none  DRAINS: straight chest tube   LOCAL MEDICATIONS USED:  OTHER On-Q  SPECIMEN:  Source of Specimen:  right lower lobe wedge resection  DISPOSITION OF SPECIMEN:  PATHOLOGY  COUNTS:  YES  PLAN OF CARE: Admit to inpatient   PATIENT DISPOSITION:  ICU - extubated and stable.   Delay start of Pharmacological VTE agent (>24hrs) due to surgical blood loss or risk of bleeding: yes  FINDINGS: Frozen section showed atypical lymphoid aggregates, no evidence of carcinoma  Dictation # [id number]

## 2016-08-25 NOTE — H&P (View-Only) (Signed)
PCP is Corrington, Delsa Grana, MD Referring Provider is Magdalen Spatz, NP  Chief Complaint  Patient presents with  . Lung Lesion    RULobe...CTCHEST 07/13/16, PET 07/18/16, PFT 07/25/16    HPI: 66 year old woman sent for consultation regarding a right upper lobe lung lesion.  Lindsey Rose is a 66 year old woman with a past history of breast cancer (bilateral mastectomy in 2001), tobacco abuse (40 pack years), anxiety, anemia, arthritis, attention deficit disorder, CDH 1 gene mutation, and recently diagnosed with polyarteritis nodosa. She has been followed by Dr. Benay Spice. He referred her for CT screening for lung cancer due to smoking history in July 2017. She had a CT in February 2017 which did not show any suspicious lesions. Therefore, she had another CT in February of this year which showed a new mixed solid/sub-solid right upper lobe lesion. Because of the dramatic change from the CT of the year earlier was recommended she have a repeat CT in 3 months. The lesion persisted. She then had a PET/CT which showed it was mildly active within SUV of 3.2.  She still smokes about one half pack of cigarettes daily. She has not had any change in appetite or weight loss. She has sleep apnea and uses CPAP. She denies cough, wheezing, and shortness of breath. She denies any chest pain, pressure, or tightness. She denies hemoptysis. She does have chronic arthritis particularly in her hands and hips sometimes her knees. She also has chronic back pain. No evidence of changed recently. She did have a fall on her right chest several months ago and broke several ribs.  Zubrod Score: At the time of surgery this patient's most appropriate activity status/level should be described as: [x]     0    Normal activity, no symptoms []     1    Restricted in physical strenuous activity but ambulatory, able to do out light work []     2    Ambulatory and capable of self care, unable to do work activities, up and about >50 % of waking  hours                              []     3    Only limited self care, in bed greater than 50% of waking hours []     4    Completely disabled, no self care, confined to bed or chair []     5    Moribund   Past Medical History:  Diagnosis Date  . ADD (attention deficit disorder with hyperactivity)   . ADHD (attention deficit hyperactivity disorder)   . Anemia   . Anxiety   . Arthritis   . Breast cancer ([place])   . Bursitis of hip    bilaterally  . Chronic lower back pain    "radiates down my legs"  . Colon polyps   . Depression   . Disc degeneration   . Diverticulosis   . GERD (gastroesophageal reflux disease)   . H/O hiatal hernia   . History of bronchitis   . History of chemotherapy 2001  . Insomnia   . Internal hemorrhoid   . Pneumonia 06/22/11;    "first time"  . Respiratory distress fall 2012   "not on ventilator"    Past Surgical History:  Procedure Laterality Date  . ABDOMINAL HYSTERECTOMY  1976   "partial"  . APPENDECTOMY  1977  . BREAST BIOPSY  2001   right  .  CHOLECYSTECTOMY  1971  . MASTECTOMY  2001   Bilateral  . ORIF SHOULDER FRACTURE  06/2006   left  . PORT-A-CATH REMOVAL  2001   right chest; "for chemo"  . SHOULDER SURGERY  10/2006   left; "put pins in it"  . TOTAL KNEE ARTHROPLASTY  12/2006   right  . TOTAL SHOULDER REPLACEMENT      Family History  Problem Relation Age of Onset  . Heart disease Father        MI at age 71  . Gastric cancer Father 40  . Hypertension Mother   . Diabetes Mother   . Colon polyps Mother        approx 2-3  . Aneurysm Paternal Grandmother 69  . Stroke Maternal Grandfather        d. 57s  . Breast cancer Sister 47       negative for familial CDH1 mutation  . Breast cancer Sister 51       negative for familial CDH1 mutation  . Breast cancer Sister 78       s/p mastectomy  . Other Sister        +CDH1 mutation  . Colon polyps Sister        6+  . Liver cancer Sister 54  . Gastric cancer Paternal Aunt 93        s/p partial gastrectomy  . Breast cancer Other 65       niece; negative for familial CDH1 mutation  . Other Other 21       nephew d. due to congenital birth defect (affecting brain)  . Aneurysm Maternal Uncle        multiple maternal uncles d. due to brain aneurysms, <50  . Lymphoma Cousin 29       maternal 1st cousin  . Breast cancer Cousin        maternal 1st cousin dx. in her 12s  . Breast cancer Other        maternal great aunt (MGF's sister) d. 62s; s/p mastectomy  . Diabetes Other     Social History Social History  Substance Use Topics  . Smoking status: Current Every Day Smoker    Packs/day: 1.00    Years: 42.00    Types: Cigarettes  . Smokeless tobacco: Never Used     Comment: 1 ppd x 30 years/ 1/2 PPD x 21 years  . Alcohol use 1.2 oz/week    2 Cans of beer per week    Current Outpatient Prescriptions  Medication Sig Dispense Refill  . albuterol (PROVENTIL HFA;VENTOLIN HFA) 108 (90 BASE) MCG/ACT inhaler Inhale 2 puffs into the lungs every 6 (six) hours as needed. For shortness of breath and wheezing     . amphetamine-dextroamphetamine (ADDERALL) 20 MG tablet Take 20 mg by mouth daily.    . celecoxib (CELEBREX) 100 MG capsule Take 100 mg by mouth 2 (two) times daily.      . Cholecalciferol (VITAMIN D3) 400 units CAPS Take 400 Units by mouth daily.    . fluticasone (FLONASE) 50 MCG/ACT nasal spray Place 1 spray into both nostrils daily.      Marland Kitchen gabapentin (NEURONTIN) 300 MG capsule 600 mg daily as needed. Two tabs at night    . Glucosamine-Chondroitin (COSAMIN DS PO) Take 1 tablet by mouth daily.      . Multiple Vitamin (MULITIVITAMIN WITH MINERALS) TABS Take 1 tablet by mouth daily.    Marland Kitchen oxyCODONE (ROXICODONE) 15 MG immediate release tablet Take 15 mg  by mouth 4 (four) times daily.     . polyethylene glycol powder (GLYCOLAX/MIRALAX) powder Take 17 g by mouth daily as needed. constipation    . Respiratory Therapy Supplies DEVI CPAP @@ hs    . sertraline (ZOLOFT) 100 MG  tablet Take 100 mg by mouth 2 (two) times daily.    . traZODone (DESYREL) 100 MG tablet Take 100 mg by mouth at bedtime.     No current facility-administered medications for this visit.     Allergies  Allergen Reactions  . Ivp Dye [Iodinated Diagnostic Agents] Hives and Shortness Of Breath  . Naproxen Sodium Hives    Review of Systems  Constitutional: Negative for activity change, appetite change, chills, fever and unexpected weight change.  HENT: Negative for trouble swallowing and voice change.   Eyes: Negative for visual disturbance.  Respiratory: Positive for apnea. Negative for cough, shortness of breath and wheezing.   Cardiovascular: Negative for chest pain and palpitations.  Gastrointestinal: Positive for abdominal pain (Reflux) and constipation (Occasional).  Genitourinary: Negative for difficulty urinating and dysuria.  Musculoskeletal: Positive for arthralgias, back pain and myalgias.  Neurological: Negative for syncope, speech difficulty, weakness and headaches.  Hematological: Negative for adenopathy. Does not bruise/bleed easily.  Psychiatric/Behavioral: Positive for dysphoric mood. The patient is nervous/anxious.   All other systems reviewed and are negative.   BP 120/67 (BP Location: Right Arm, Patient Position: Sitting, Cuff Size: Normal)   Pulse 85   Resp 16   Ht 5' 2.5" (1.588 m)   Wt 136 lb (61.7 kg)   SpO2 93% Comment: ON RA  BMI 24.48 kg/m  Physical Exam  Constitutional: She is oriented to person, place, and time. She appears well-developed and well-nourished. No distress.  HENT:  Head: Normocephalic and atraumatic.  Eyes: Conjunctivae and EOM are normal. No scleral icterus.  Neck: Neck supple. No thyromegaly present.  Cardiovascular: Normal rate, regular rhythm, normal heart sounds and intact distal pulses.  Exam reveals no gallop and no friction rub.   No murmur heard. Pulmonary/Chest: Effort normal and breath sounds normal. No respiratory distress.  She has no wheezes. She has no rales.  Abdominal: Soft. She exhibits no distension. There is no tenderness.  Musculoskeletal: She exhibits no edema.  Right arm in sling due to recent shoulder injury  Lymphadenopathy:    She has no cervical adenopathy.  Neurological: She is alert and oriented to person, place, and time. No cranial nerve deficit.  Motor grossly intact  Skin: Skin is warm and dry.  Vitals reviewed.    Diagnostic Tests: CHEST WITHOUT CONTRAST LOW-DOSE FOR LUNG CANCER SCREENING  TECHNIQUE: Multidetector CT imaging of the chest was performed following the standard protocol without IV contrast.  COMPARISON:  06/22/2011 diagnostic CT. Outside CT of 03/30/2015 (report not available). No prior screening CT.  FINDINGS: Cardiovascular: Aortic and branch vessel atherosclerosis. Normal heart size, without pericardial effusion. Multivessel coronary artery atherosclerosis.  Mediastinum/Nodes: No mediastinal or definite hilar adenopathy, given limitations of unenhanced CT.  Lungs/Pleura: No pleural fluid. Isolated inferior right lower lobe part solid pulmonary lesion is identified. The sub solid component measures volume derived equivalent diameter 18.0 mm. The solid component measures volume derived equivalent diameter 14.5 mm. Example image 146/series 3. This is new since the outside CT of 03/30/2015.  Upper Abdomen: Normal imaged portions of the liver, spleen, stomach, pancreas, adrenal glands, left kidney. Too small to characterize upper pole right renal lesion is most likely a cyst. Incompletely imaged fat containing ventral abdominal wall hernia  is tiny.  Musculoskeletal: Left shoulder arthroplasty. No acute osseous abnormality.  IMPRESSION: 1. Lung-RADS Category 4A, suspicious. Follow up low-dose chest CT without contrast in 3 months (please use the following order, "CT CHEST LCS NODULE FOLLOW-UP W/O CM") is recommended. Inferior right upper lobe part  solid pulmonary lesion is indeterminate. Although this could represent primary bronchogenic carcinoma, development since the outside diagnostic CT of 03/30/2015 suggests infection as an alternate explanation. 2.  Coronary artery atherosclerosis. Aortic atherosclerosis. These results will be called to the ordering clinician or representative by the Radiologist Assistant, and communication documented in the PACS or zVision Dashboard.   Electronically Signed   By: Abigail Miyamoto M.D.   On: 04/12/2016 13:09 NUCLEAR MEDICINE PET SKULL BASE TO THIGH  TECHNIQUE: 6.8 mCi F-18 FDG was injected intravenously. Full-ring PET imaging was performed from the skull base to thigh after the radiotracer. CT data was obtained and used for attenuation correction and anatomic localization.  FASTING BLOOD GLUCOSE:  Value: 115 mg/dl  COMPARISON:  CT chest 07/03/2016  FINDINGS: NECK  No hypermetabolic lymph nodes in the neck.  CHEST  2.8 cm sub solid nodule in the posterior right upper lobe shows low level FDG uptake with SUV max = 3.2.  No evidence for hypermetabolic mediastinal or hilar lymphadenopathy.  Heart is upper normal. Coronary artery and thoracic aortic atherosclerosis evident.  ABDOMEN/PELVIS  No abnormal hypermetabolic activity within the liver, pancreas, adrenal glands, or spleen. No hypermetabolic lymph nodes in the abdomen or pelvis.  9 mm exophytic lesion upper pole right kidney has attenuation higher than would be expected for a simple cyst. This is homogeneous on today's study and the lesion smaller than 03/06/2012 CT scan when it measured 15 mm.  SKELETON  Focal uptake in the right anterolateral fourth, fifth, and sixth ribs compatible with healing fractures visible at these locations on CT imaging.  IMPRESSION: 1. Sub solid right upper lobe pulmonary nodule shows low level hypermetabolism, concerning for neoplasm. 2. No evidence for  hypermetabolic metastatic disease in the neck, chest, abdomen, or pelvis. 3. 9 mm exophytic lesion upper pole right kidney probably a cyst complicated by proteinaceous debris or hemorrhage. No hypermetabolism on PET imaging today.   Electronically Signed   By: Misty Stanley M.D.   On: 07/18/2016 14:30  Pulmonary function testing FVC 2.77 (94%) FEV1 2.08 (93%)/ 2.24 (100%) post bronchodilator TLC 4.70 (95%) DLCO 14.41 (62%)  Impression: Lindsey Rose is a 66 year old woman with a past history of tobacco abuse, breast cancer and newly diagnosed polyarteritis nodosa, as well as numerous other issues.  She had a screening CT done in February which showed a new mixed solid and sub-solid right upper lobe nodule. This was a new finding from a CT at an outside hospital done a year prior. A follow-up CT in May showed the lesion was slightly larger. Of interest in the meantime she was also diagnosed with polyarteritis nodosa. A PET CT showed the nodule was mildly hypermetabolic with an SUV of 3.2.  Given her smoking history is new primary bronchogenic carcinoma is certainly a consideration. This would be relatively rapid growth, but that is not unheard of. It is also possible that this is a manifestation of polyarteritis nodosa or some other inflammatory process. Infection is also a consideration, even though she does not have any systemic symptoms of infection.  I discussed 3 possible options with Lindsey Rose and her husband. First would be continued radiographic observation. I do not favor this given the interval  growth and positive PET/CT. The second option would be navigational bronchoscopy for transbronchial biopsy. This could potentially be diagnostic and avoid the need for surgery should this turned out to be inflammatory. She would need continued radiographic follow-up afterwards. There also is a possibility this could be nondiagnostic. The final option would be to proceed with right VATS for  wedge resection and possible segmentectomy or lobectomy depending on intraoperative findings. Of the 3 she strongly favors, and really only would consider, wedge resection.  We discussed the possibility of right VATS, wedge resection, possible segmentectomy or lobectomy and frozen section positive for cancer. I informed her of the general nature of the procedure, the incisions to be used, the need for general anesthesia, the use of a chest tube postoperatively, the expected hospital stay, and the overall recovery. I informed them of the indications, risks, benefits, and alternatives. They understand the risk include, but are not limited to death, MI, DVT, PE, bleeding, possible need for transfusion, infection, prolonged air leak, cardiac arrhythmias, as well as the possibility of other unforeseeable complications.  She strongly wishes to proceed with surgical resection. She would like to wait until after July 4.  Plan: Right VATS, wedge resection, possible segmentectomy or lobectomy on Wednesday, 08/24/2016  Melrose Nakayama, MD [facility] [phone]

## 2016-08-25 NOTE — Anesthesia Procedure Notes (Signed)
Arterial Line Insertion Start/End7/01/2017 7:02 AM, 08/25/2016 7:08 AM Performed by: Henreitta Cea A, CRNA  Patient location: Pre-op. Preanesthetic checklist: patient identified, IV checked, site marked, risks and benefits discussed, surgical consent, monitors and equipment checked, pre-op evaluation and timeout performed Lidocaine 1% used for infiltration Right, radial was placed Catheter size: 20 G Hand hygiene performed  and maximum sterile barriers used  Duffey's test indicative of satisfactory collateral circulation Attempts: 2 Procedure performed without using ultrasound guided technique. Following insertion, dressing applied and Biopatch. Post procedure assessment: normal  Patient tolerated the procedure well with no immediate complications.

## 2016-08-25 NOTE — Interval H&P Note (Signed)
History and Physical Interval Note: She has a right UPPER lobe nodule not right lower lobe.  08/25/2016 8:32 AM  Lindsey Rose  has presented today for surgery, with the diagnosis of RLL NODULE  The various methods of treatment have been discussed with the patient and family. After consideration of risks, benefits and other options for treatment, the patient has consented to  Procedure(s): VIDEO ASSISTED THORACOSCOPY (VATS)/WEDGE RESECTION (Right) possible SEGMENTECTOMY (Right) possible LOBECTOMY (Right) as a surgical intervention .  The patient's history has been reviewed, patient examined, no change in status, stable for surgery.  I have reviewed the patient's chart and labs.  Questions were answered to the patient's satisfaction.     Melrose Nakayama

## 2016-08-25 NOTE — Progress Notes (Signed)
      [address]       [place] [address]             [phone]      S/p RUL wedge  BP (!) 98/51   Pulse 76   Temp 98.2 F (36.8 C) (Oral)   Resp 14   Ht 5' 2.5" (1.588 m)   Wt 135 lb 9.3 oz (61.5 kg)   SpO2 94%   BMI 24.40 kg/m    Intake/Output Summary (Last 24 hours) at 08/25/16 1828 Last data filed at 08/25/16 1700  Gross per 24 hour  Intake          1351.67 ml  Output              585 ml  Net           766.67 ml    Requesting prednisone for flare up of polyarteritis  Will start prednisone 20 mg daily  Remo Lipps C. Roxan Hockey, MD [facility] [phone]

## 2016-08-26 ENCOUNTER — Inpatient Hospital Stay (HOSPITAL_COMMUNITY): Payer: Medicare Other

## 2016-08-26 ENCOUNTER — Encounter (HOSPITAL_COMMUNITY): Payer: Self-pay | Admitting: Thoracic Surgery (Cardiothoracic Vascular Surgery)

## 2016-08-26 LAB — BLOOD GAS, ARTERIAL
Acid-Base Excess: 1.9 mmol/L (ref 0.0–2.0)
BICARBONATE: 26.4 mmol/L (ref 20.0–28.0)
FIO2: 28
O2 SAT: 97.1 %
PH ART: 7.389 (ref 7.350–7.450)
Patient temperature: 98.1
pCO2 arterial: 44.6 mmHg (ref 32.0–48.0)
pO2, Arterial: 93.7 mmHg (ref 83.0–108.0)

## 2016-08-26 LAB — GLUCOSE, CAPILLARY
Glucose-Capillary: 148 mg/dL — ABNORMAL HIGH (ref 65–99)
Glucose-Capillary: 81 mg/dL (ref 65–99)

## 2016-08-26 LAB — CBC
HCT: 36.1 % (ref 36.0–46.0)
Hemoglobin: 11.4 g/dL — ABNORMAL LOW (ref 12.0–15.0)
MCH: 29.4 pg (ref 26.0–34.0)
MCHC: 31.6 g/dL (ref 30.0–36.0)
MCV: 93 fL (ref 78.0–100.0)
PLATELETS: 174 10*3/uL (ref 150–400)
RBC: 3.88 MIL/uL (ref 3.87–5.11)
RDW: 15.1 % (ref 11.5–15.5)
WBC: 8.5 10*3/uL (ref 4.0–10.5)

## 2016-08-26 LAB — BASIC METABOLIC PANEL
Anion gap: 6 (ref 5–15)
BUN: 7 mg/dL (ref 6–20)
CALCIUM: 7.8 mg/dL — AB (ref 8.9–10.3)
CHLORIDE: 103 mmol/L (ref 101–111)
CO2: 24 mmol/L (ref 22–32)
CREATININE: 0.53 mg/dL (ref 0.44–1.00)
GFR calc Af Amer: >60 mL/min (ref >60)
GFR calc non Af Amer: >60 mL/min (ref >60)
Glucose, Bld: 152 mg/dL — ABNORMAL HIGH (ref 65–99)
Potassium: 3.3 mmol/L — ABNORMAL LOW (ref 3.5–5.1)
Sodium: 133 mmol/L — ABNORMAL LOW (ref 135–145)

## 2016-08-26 MED ORDER — OXYCODONE HCL 5 MG PO TABS
5.0000 mg | ORAL_TABLET | ORAL | Status: DC | PRN
  Administered 2016-08-26 – 2016-08-29 (×12): 10 mg via ORAL
  Filled 2016-08-26 (×13): qty 2

## 2016-08-26 MED ORDER — POTASSIUM CHLORIDE 10 MEQ/50ML IV SOLN
10.0000 meq | INTRAVENOUS | Status: AC
  Administered 2016-08-26 (×2): 10 meq via INTRAVENOUS
  Filled 2016-08-26: qty 50

## 2016-08-26 NOTE — Anesthesia Postprocedure Evaluation (Signed)
Anesthesia Post Note  Patient: Lindsey Rose  Procedure(s) Performed: Procedure(s) (LRB): RIGHT VIDEO ASSISTED THORACOSCOPY (VATS)/RIGHT UPPER LOBE WEDGE RESECTION (Right)     Patient location during evaluation: PACU Anesthesia Type: General Level of consciousness: awake and alert Pain management: pain level controlled Vital Signs Assessment: post-procedure vital signs reviewed and stable Respiratory status: spontaneous breathing, nonlabored ventilation, respiratory function stable and patient connected to nasal cannula oxygen Cardiovascular status: blood pressure returned to baseline and stable Postop Assessment: no signs of nausea or vomiting Anesthetic complications: no    Last Vitals:  Vitals:   08/26/16 0749 08/26/16 0800  BP: 132/64 130/72  Pulse: 75 68  Resp: 14 14  Temp: 36.6 C     Last Pain:  Vitals:   08/26/16 0800  TempSrc:   PainSc: 7                  Eretria Manternach

## 2016-08-26 NOTE — Care Management Note (Addendum)
Case Management Note  Patient Details  Name: Lindsey Rose MRN: [id number] Date of Birth: [birth year]  Subjective/Objective:  From home with spouse, pta indep, pod 1 VATS Wedge Resection, on full dose PCA, Has on Q, chest tube to water seal.  She has had [place] services before in the past.  She has PCP, medication coverage and transportation at discharge.                 Action/Plan: NCM will follow for dc needs.   Expected Discharge Date:                  Expected Discharge Plan:     In-House Referral:     Discharge planning Services  CM Consult  Post Acute Care Choice:    Choice offered to:     DME Arranged:    DME Agency:     HH Arranged:    HH Agency:     Status of Service:  In process, will continue to follow  If discussed at Long Length of Stay Meetings, dates discussed:    Additional Comments:  Zenon Mayo, RN 08/26/2016, 7:49 AM

## 2016-08-26 NOTE — Progress Notes (Signed)
[facility] BRIEF SICU PROGRESS NOTE  1 Day Post-Op  S/P Procedure(s) (LRB): RIGHT VIDEO ASSISTED THORACOSCOPY (VATS)/RIGHT UPPER LOBE WEDGE RESECTION (Right)   Stable day Analgesia improved  Plan: Continue current plan  Rexene Alberts, MD 08/26/2016 7:33 PM

## 2016-08-26 NOTE — Progress Notes (Signed)
1 Day Post-Op Procedure(s) (LRB): RIGHT VIDEO ASSISTED THORACOSCOPY (VATS)/RIGHT UPPER LOBE WEDGE RESECTION (Right) Subjective: C/o pain  Objective: Vital signs in last 24 hours: Temp:  [97.6 F (36.4 C)-98.2 F (36.8 C)] 98.1 F (36.7 C) (07/13 0300) Pulse Rate:  [63-114] 94 (07/13 0705) Cardiac Rhythm: Normal sinus rhythm (07/13 0400) Resp:  [13-27] 27 (07/13 0705) BP: (94-141)/(51-87) 132/84 (07/13 0705) SpO2:  [90 %-100 %] 90 % (07/13 0705) Arterial Line BP: (61-184)/(50-101) 100/93 (07/13 0400) Weight:  [135 lb 9.3 oz (61.5 kg)] 135 lb 9.3 oz (61.5 kg) (07/12 1205)  Hemodynamic parameters for last 24 hours:    Intake/Output from previous day: 07/12 0701 - 07/13 0700 In: 2971.7 [P.O.:180; I.V.:2641.7; IV Piggyback:150] Out: 1740 [Urine:1300; Blood:50; Chest Tube:390] Intake/Output this shift: No intake/output data recorded.  General appearance: alert, cooperative, moderate distress and distress due to pain Neurologic: intact Heart: regular rate and rhythm Lungs: diminished breath sounds bibasilar Abdomen: normal findings: soft, non-tender  Lab Results:  Recent Labs  08/23/16 1350 08/26/16 0300  WBC 6.7 8.5  HGB 12.0 11.4*  HCT 37.2 36.1  PLT 188 174   BMET:  Recent Labs  08/23/16 1350 08/26/16 0300  NA 134* 133*  K 4.1 3.3*  CL 106 103  CO2 21* 24  GLUCOSE 103* 152*  BUN 15 7  CREATININE 0.76 0.53  CALCIUM 8.8* 7.8*    PT/INR:  Recent Labs  08/23/16 1350  LABPROT 12.8  INR 0.96   ABG    Component Value Date/Time   PHART 7.389 08/26/2016 0320   HCO3 26.4 08/26/2016 0320   TCO2 27 06/22/2011 0437   ACIDBASEDEF 0.6 08/23/2016 1357   O2SAT 97.1 08/26/2016 0320   CBG (last 3)   Recent Labs  08/25/16 1927 08/25/16 2309 08/26/16 0309  GLUCAP 105* 113* 148*    Assessment/Plan: S/P Procedure(s) (LRB): RIGHT VIDEO ASSISTED THORACOSCOPY (VATS)/RIGHT UPPER LOBE WEDGE RESECTION (Right) -POD # 1  Pain control is major issue. She is on a  full dose PCA and has oral oxycodone as well. Has On-Q with bupivicaine as well. Also PO celebrex, colchicine and prednisone. Toradol not an option due to hives with naproxen.  No air leak- CT to water seal Hypokalemia- supplement IV SCD + enoxaparin for DVT prophylaxis CBG well controlled- dc checks    LOS: 1 day    Lindsey Rose 08/26/2016

## 2016-08-26 NOTE — Discharge Instructions (Signed)
Video-Assisted Thoracic Surgery, Care After ° °This sheet gives you information about how to care for yourself after your procedure. Your health care provider may also give you more specific instructions. If you have problems or questions, contact your health care provider. °What can I expect after the procedure? °After the procedure, it is common to have: °· Some pain and soreness in your chest. °· Pain when breathing in (inhaling) and coughing. °· Constipation. °· Fatigue. °· Difficulty sleeping. ° °Follow these instructions at home: °Preventing pneumonia °· Take deep breaths or do breathing exercises as instructed by your health care provider. Doing this helps prevent lung infection (pneumonia). °· Cough frequently. Coughing may cause discomfort, but it is important to clear mucus (phlegm) and expand your lungs. If it hurts to cough, hold a pillow against your chest or place the palms of both hands on top of the incision (use splinting) when you cough. This may help relieve discomfort. °· If you were given an incentive spirometer, use it as directed. An incentive spirometer is a tool that measures how well you are filling your lungs with each breath. °· Participate in pulmonary rehabilitation as directed by your health care provider. This is a program that combines education, exercise, and support from a team of specialists. The goal is to help you heal and get back to your normal activities as soon as possible. °Medicines °· Take over-the-counter or prescription medicines only as told by your health care provider. °· If you have pain, take pain-relieving medicine before your pain becomes severe. This is important because if your pain is under control, you will be able to breathe and cough more comfortably. °· If you were prescribed an antibiotic medicine, take it as told by your health care provider. Do not stop taking the antibiotic even if you start to feel better. °Activity °· Ask your health care provider  what activities are safe for you. °· Avoid activities that use your chest muscles for at least 3-4 weeks. °· Do not lift anything that is heavier than 10 lb (4.5 kg), or the limit that your health care provider tells you, until he or she says that it is safe. °Incision care °· Follow instructions from your health care provider about how to take care of your incision(s). Make sure you: °? Wash your hands with soap and water before you change your bandage (dressing). If soap and water are not available, use hand sanitizer. °? Change your dressing as told by your health care provider. °? Leave stitches (sutures), skin glue, or adhesive strips in place. These skin closures may need to stay in place for 2 weeks or longer. If adhesive strip edges start to loosen and curl up, you may trim the loose edges. Do not remove adhesive strips completely unless your health care provider tells you to do that. °· Keep your dressing dry until it has been removed. °· Check your incision area every day for signs of infection. Check for: °? Redness, swelling, or pain. °? Fluid or blood. °? Warmth. °? Pus or a bad smell. °Bathing °· Do not take baths, swim, or use a hot tub until your health care provider approves. You may take showers. °· After your dressing has been removed, use soap and water to gently wash your incision area. Do not use anything else to clean your incision(s) unless your health care provider tells you to do this. °Driving °· Do not drive until your health care provider approves. °· Do not drive   or use heavy machinery while taking prescription pain medicine. °Eating and drinking °· Eat a healthy, balanced diet as instructed by your health care provider. A healthy diet includes plenty of fresh fruits and vegetables, whole grains, and low-fat (lean) proteins. °· Limit foods that are high in fat and processed sugars, such as fried and sweet foods. °· Drink enough fluid to keep your urine clear or pale yellow. °General  instructions °· To prevent or treat constipation while you are taking prescription pain medicine, your health care provider may recommend that you: °? Take over-the-counter or prescription medicines. °? Eat foods that are high in fiber, such as beans, fresh fruits and vegetables, and whole grains. °· Do not use any products that contain nicotine or tobacco, such as cigarettes and e-cigarettes. If you need help quitting, ask your health care provider. °· Avoid secondhand smoke. °· Wear compression stockings as told by your health care provider. These stockings help to prevent blood clots and reduce swelling in your legs. °· If you have a chest tube, care for it as instructed by your health care provider. Do not travel by airplane during the 2 weeks after your chest tube is removed, or until your health care provider says that this is safe. °· Keep all follow-up visits as told by your health care provider. This is important. °Contact a health care provider if: °· You have redness, swelling, or pain around an incision. °· You have fluid or blood coming from an incision. °· Your incision area feels warm to the touch. °· You have pus or a bad smell coming from an incision. °· You have a fever or chills. °· You have nausea or vomiting. °· You have pain that does not get better with medicine. °Get help right away if: °· You have chest pain. °· Your heart is fluttering or beating rapidly. °· You develop a rash. °· You have shortness of breath or trouble breathing. °· You are confused. °· You have trouble speaking. °· You feel weak, light-headed, or dizzy. °· You faint. °Summary °· To help prevent lung infection (pneumonia), take deep breaths or do breathing exercises as instructed by your health care provider. °· Cough frequently to clear mucus (phlegm) and expand your lungs. If it hurts to cough, hold a pillow against your chest or place the palms of both hands on top of the incision (use splinting) when you cough. °· If  you have pain, take pain-relieving medicine before your pain becomes severe. This is important because if your pain is under control, you will be able to breathe and cough more comfortably. °· Ask your health care provider what activities are safe for you. °This information is not intended to replace advice given to you by your health care provider. Make sure you discuss any questions you have with your health care provider. °Document Released: 05/28/2012 Document Revised: 01/11/2016 Document Reviewed: 01/11/2016 °Elsevier Interactive Patient Education © 2017 [employer] ° °

## 2016-08-26 NOTE — Discharge Summary (Signed)
Physician Discharge Summary  Patient ID: Lindsey Rose MRN: [id number] DOB/AGE: 17-Nov-1950 66 y.o.  Admit date: 08/25/2016 Discharge date: 08/29/2016  Admission Diagnoses:  Patient Active Problem List   Diagnosis Date Noted  . COPD (chronic obstructive pulmonary disease) ([place]) 06/23/2011  . Acute respiratory failure with hypoxia ([place]) 06/23/2011  . Community acquired pneumonia 06/22/2011  . Tobacco abuse 02/22/2011  . Orthostasis 01/20/2011  . Dehydration 01/20/2011  . Chronic lumbar pain 01/20/2011  . Degenerative disc disease, lumbar 01/20/2011  . Anxiety and depression 01/20/2011  . History of breast cancer in female 01/20/2011  . UTI (lower urinary tract infection) 01/19/2011  . Hypotension 01/19/2011  . Bradycardia 01/19/2011  ' Discharge Diagnoses:   Patient Active Problem List   Diagnosis Date Noted  . S/P partial lobectomy of lung 08/25/2016  . COPD (chronic obstructive pulmonary disease) ([place]) 06/23/2011  . Acute respiratory failure with hypoxia ([place]) 06/23/2011  . Community acquired pneumonia 06/22/2011  . Tobacco abuse 02/22/2011  . Orthostasis 01/20/2011  . Dehydration 01/20/2011  . Chronic lumbar pain 01/20/2011  . Degenerative disc disease, lumbar 01/20/2011  . Anxiety and depression 01/20/2011  . History of breast cancer in female 01/20/2011  . UTI (lower urinary tract infection) 01/19/2011  . Hypotension 01/19/2011  . Bradycardia 01/19/2011   Discharged Condition: good  History of Present Illness:  Lindsey Rose is a 66 yo female with known history of breast cancer (bilateral mastectomy 2001), tobacco abuse, anxiety, ADD, CDH 1 gene mutation, and polyarteritis nodosa.  She has been routinely followed by Dr. Benay Spice.  He referred her for CT screening for lung cancer due to her history of lung cancer.  In February of 2017 her CT scan did not show any evidence of lung cancer.  However repeat CT scan performed in February of this year showed a new mixed  solid/sub right upper lobe lesion.  Due to the sudden change a repeat CT scan was obtained 3 months later.  The lesion was still present and the patient was referred for a PET CT scan.  This showed the lesion to be mildly hypermetabolic with a SUV of 3.2.  Due to this she was referred to Dr. Roxan Hockey for possible surgical resection.  He provided the patient 3 treatment options including observation which was not recommended, biopsy which could be non-diagnostic, or surgical resection.  She opted to have a surgical wedge resection, the risks and benefits of the procedure were explained to the patient and he was agreeable to proceed.  Hospital Course:   Lindsey Rose presented to [facility] on 08/25/2016.  She was taken to the operating room and underwent Video Bronchoscopy with Right VATS, wedge resection RUL, and completion lobectomy.  She tolerated the procedure without difficulty, was extubated and taken to the SICU in stable condition.  She developed a flare up of her polyarteritis nodosa and was started on Prednisone.  She had no post operative air leak and was transitioned to water seal on POD #1.  Follow up CXR showed right chest tube in good position with no pneumothoax.  Her chest tube was removed on 08/28/16.  Follow up CXR showed no pneuymothorax.  Her pain is well controlled.  She is ambulating independently.  She is tolerating a diet.  She is felt medically stable for discharge home today.  Significant Diagnostic Studies: nuclear medicine:   IMPRESSION: 1. Sub solid right upper lobe pulmonary nodule shows low level hypermetabolism, concerning for neoplasm. 2. No evidence for hypermetabolic metastatic disease  in the neck, chest, abdomen, or pelvis. 3. 9 mm exophytic lesion upper pole right kidney probably a cyst complicated by proteinaceous debris or hemorrhage. No hypermetabolism on PET imaging today.  Treatments: surgery:   Right video-assisted thoracoscopy, wedge resection,  right upper lobe nodule, on-Q local anesthetic catheter placement.  Disposition: 01-Home or Self Care   Discharge Medications:  Discharge Instructions    Discharge patient    Complete by:  As directed    Discharge disposition:  01-Home or Self Care   Discharge patient date:  08/29/2016     Allergies as of 08/29/2016      Reactions   Ivp Dye [iodinated Diagnostic Agents] Hives, Shortness Of Breath   Naproxen Sodium Hives   Morphine Hives   Latex Other (See Comments)   Breaks out in area that she's touched with latex       Medication List    TAKE these medications   albuterol 108 (90 Base) MCG/ACT inhaler Commonly known as:  PROVENTIL HFA;VENTOLIN HFA Inhale 2 puffs into the lungs every 6 (six) hours as needed. For shortness of breath and wheezing   amphetamine-dextroamphetamine 20 MG tablet Commonly known as:  ADDERALL Take 20 mg by mouth 2 (two) times daily.   buPROPion 150 MG 24 hr tablet Commonly known as:  WELLBUTRIN XL Take 150 mg by mouth daily.   CALCIUM-MAGNESIUM-ZINC PO Take 1 tablet by mouth daily.   celecoxib 100 MG capsule Commonly known as:  CELEBREX Take 100 mg by mouth 2 (two) times daily.   colchicine 0.6 MG tablet Take 0.6 mg by mouth 2 (two) times daily.   fluticasone 50 MCG/ACT nasal spray Commonly known as:  FLONASE Place 1 spray into both nostrils daily as needed for allergies.   gabapentin 300 MG capsule Commonly known as:  NEURONTIN Take 600 mg by mouth at bedtime.   multivitamin with minerals Tabs tablet Take 1 tablet by mouth daily.   oxyCODONE 5 MG immediate release tablet Commonly known as:  Oxy IR/ROXICODONE Take 1 tablet (5 mg total) by mouth every 6 (six) hours as needed for severe pain. What changed:  medication strength  how much to take  when to take this  reasons to take this   pantoprazole 40 MG tablet Commonly known as:  PROTONIX Take 40 mg by mouth daily as needed. Acid reflux   polyethylene glycol powder  powder Commonly known as:  GLYCOLAX/MIRALAX Take 17 g by mouth daily as needed. constipation   predniSONE 20 MG tablet Commonly known as:  DELTASONE Take 1 tablet (20 mg total) by mouth daily before breakfast.   Respiratory Therapy Supplies Devi CPAP @@ hs   sertraline 100 MG tablet Commonly known as:  ZOLOFT Take 200 mg by mouth daily.   traZODone 100 MG tablet Commonly known as:  DESYREL Take 100 mg by mouth at bedtime.   Vitamin D 2000 units Caps Take 2,000 Units by mouth daily.   VITAMIN E-400 400 UNIT capsule Generic drug:  vitamin E Take 400 Units by mouth daily.      Follow-up Information    Melrose Nakayama, MD Follow up on 09/13/2016.   Specialty:  Cardiothoracic Surgery Why:  Appointment is at 3:45, please get CXR at 3:15 at [facility] located on first floor  Contact information: [address] [place] [address] [phone]        Corrington, Kip A, MD. Call in 1 day(s).   Specialty:  Family Medicine Contact information: [phone]  [place] [address] [phone]        nursing Follow up.   Why:  Please arrive at 10:00am for a suture removal appoinement on 09/05/2016.  Contact information: Dr. Leonarda Salon office          Signed: Elgie Collard 08/29/2016, 8:14 AM

## 2016-08-27 ENCOUNTER — Inpatient Hospital Stay (HOSPITAL_COMMUNITY): Payer: Medicare Other

## 2016-08-27 LAB — CBC
HEMATOCRIT: 35 % — AB (ref 36.0–46.0)
Hemoglobin: 10.9 g/dL — ABNORMAL LOW (ref 12.0–15.0)
MCH: 28.8 pg (ref 26.0–34.0)
MCHC: 31.1 g/dL (ref 30.0–36.0)
MCV: 92.6 fL (ref 78.0–100.0)
PLATELETS: 178 10*3/uL (ref 150–400)
RBC: 3.78 MIL/uL — AB (ref 3.87–5.11)
RDW: 14.7 % (ref 11.5–15.5)
WBC: 8.8 10*3/uL (ref 4.0–10.5)

## 2016-08-27 LAB — COMPREHENSIVE METABOLIC PANEL
ALT: 13 U/L — ABNORMAL LOW (ref 14–54)
AST: 19 U/L (ref 15–41)
Albumin: 2.5 g/dL — ABNORMAL LOW (ref 3.5–5.0)
Alkaline Phosphatase: 67 U/L (ref 38–126)
Anion gap: 5 (ref 5–15)
BUN: 10 mg/dL (ref 6–20)
CHLORIDE: 104 mmol/L (ref 101–111)
CO2: 27 mmol/L (ref 22–32)
Calcium: 8.6 mg/dL — ABNORMAL LOW (ref 8.9–10.3)
Creatinine, Ser: 0.62 mg/dL (ref 0.44–1.00)
GFR calc Af Amer: >60 mL/min (ref >60)
GLUCOSE: 133 mg/dL — AB (ref 65–99)
POTASSIUM: 3.9 mmol/L (ref 3.5–5.1)
Sodium: 136 mmol/L (ref 135–145)
Total Bilirubin: 0.2 mg/dL — ABNORMAL LOW (ref 0.3–1.2)
Total Protein: 5.3 g/dL — ABNORMAL LOW (ref 6.5–8.1)

## 2016-08-27 MED ORDER — SODIUM CHLORIDE 0.9% FLUSH
10.0000 mL | INTRAVENOUS | Status: DC | PRN
Start: 2016-08-27 — End: 2016-08-29

## 2016-08-27 MED ORDER — SODIUM CHLORIDE 0.9% FLUSH
10.0000 mL | Freq: Two times a day (BID) | INTRAVENOUS | Status: DC
  Administered 2016-08-27 – 2016-08-28 (×2): 10 mL

## 2016-08-27 MED ORDER — CHLORHEXIDINE GLUCONATE CLOTH 2 % EX PADS
6.0000 | MEDICATED_PAD | Freq: Every day | CUTANEOUS | Status: DC
  Administered 2016-08-28: 6 via TOPICAL

## 2016-08-27 NOTE — Progress Notes (Signed)
Patient ID: Lindsey Rose, female   DOB: 10-Jan-1951, 66 y.o.   MRN: [id number] EVENING ROUNDS NOTE :     [address]       [place] [address]             [phone]                 2 Days Post-Op Procedure(s) (LRB): RIGHT VIDEO ASSISTED THORACOSCOPY (VATS)/RIGHT UPPER LOBE WEDGE RESECTION (Right)  Total Length of Stay:  LOS: 2 days  BP 124/65   Pulse 94   Temp 98 F (36.7 C) (Oral)   Resp 20   Ht 5' 2.5" (1.588 m)   Wt 132 lb 7.9 oz (60.1 kg)   SpO2 100%   BMI 23.85 kg/m   .Intake/Output      07/13 0701 - 07/14 0700 07/14 0701 - 07/15 0700   P.O. 950 480   I.V. (mL/kg) 325 (5.4) 90 (1.5)   IV Piggyback 100    Total Intake(mL/kg) 1375 (22.9) 570 (9.5)   Urine (mL/kg/hr) 1450 (1) 300 (0.4)   Chest Tube 120    Total Output 1570 300   Net -195 +270        Urine Occurrence  1 x   Stool Occurrence  2 x     . bupivacaine ON-Q pain pump    . dextrose 5 % and 0.45% NaCl 10 mL/hr at 08/27/16 1600  . potassium chloride 10 mEq (08/26/16 0759)     Lab Results  Component Value Date   WBC 8.8 08/27/2016   HGB 10.9 (L) 08/27/2016   HCT 35.0 (L) 08/27/2016   PLT 178 08/27/2016   GLUCOSE 133 (H) 08/27/2016   ALT 13 (L) 08/27/2016   AST 19 08/27/2016   NA 136 08/27/2016   K 3.9 08/27/2016   CL 104 08/27/2016   CREATININE 0.62 08/27/2016   BUN 10 08/27/2016   CO2 27 08/27/2016   TSH 1.827 01/19/2011   INR 0.96 08/23/2016   HGBA1C 5.7 (H) 01/19/2011   Stable day Chest tube out in am   Grace Isaac MD  [facility] [phone] Office [phone] 08/27/2016 6:48 PM

## 2016-08-27 NOTE — Plan of Care (Addendum)
Problem: Activity: Goal: Risk for activity intolerance will decrease Outcome: Progressing Patient ambulated 740 feet with walker.  Problem: Respiratory: Goal: Pain level will decrease with appropriate interventions Outcome: Progressing Patient with significant better pain control last night than previous night. Minimal reports of pain and minimal use of PCA overnight  Goal: Respiratory status will improve Outcome: Completed/Met Date Met: 08/27/16 Patient tolerating room air with O2 sats in the mid to upper 90s.

## 2016-08-27 NOTE — Progress Notes (Signed)
Patient ID: Lindsey Rose, female   DOB: Jul 20, 1950, 66 y.o.   MRN: [id number] TCTS DAILY ICU PROGRESS NOTE                   [address]            [place] [address]          [phone]   2 Days Post-Op Procedure(s) (LRB): RIGHT VIDEO ASSISTED THORACOSCOPY (VATS)/RIGHT UPPER LOBE WEDGE RESECTION (Right)  Total Length of Stay:  LOS: 2 days   Subjective: Awake and alert, good pain control No air leak from chest tube on water seal   Objective: Vital signs in last 24 hours: Temp:  [97.4 F (36.3 C)-98.4 F (36.9 C)] 98.4 F (36.9 C) (07/14 0814) Pulse Rate:  [63-86] 75 (07/14 0700) Cardiac Rhythm: Normal sinus rhythm (07/14 0600) Resp:  [10-22] 17 (07/14 0700) BP: (82-146)/(49-99) 121/81 (07/14 0700) SpO2:  [93 %-98 %] 95 % (07/14 0700) Weight:  [132 lb 7.9 oz (60.1 kg)] 132 lb 7.9 oz (60.1 kg) (07/14 0500)  Filed Weights   08/25/16 0600 08/25/16 1205 08/27/16 0500  Weight: 129 lb (58.5 kg) 135 lb 9.3 oz (61.5 kg) 132 lb 7.9 oz (60.1 kg)    Weight change: -3 lb 1.4 oz (-1.4 kg)   Hemodynamic parameters for last 24 hours:    Intake/Output from previous day: 07/13 0701 - 07/14 0700 In: 0240 [P.O.:950; I.V.:325; IV Piggyback:100] Out: 9735 [Urine:1450; Chest Tube:120]  Intake/Output this shift: No intake/output data recorded.  Current Meds: Scheduled Meds: . acetaminophen  1,000 mg Oral Q6H   Or  . acetaminophen (TYLENOL) oral liquid 160 mg/5 mL  1,000 mg Oral Q6H  . amphetamine-dextroamphetamine  20 mg Oral BID WC  . bisacodyl  10 mg Oral Daily  . buPROPion  150 mg Oral Daily  . celecoxib  100 mg Oral BID  . colchicine  0.6 mg Oral BID  . enoxaparin (LOVENOX) injection  40 mg Subcutaneous Daily  . fentaNYL   Intravenous Q4H  . gabapentin  600 mg Oral QHS  . predniSONE  20 mg Oral QAC breakfast  . senna-docusate  1 tablet Oral QHS  . sertraline  200 mg Oral Daily  . traZODone  100 mg Oral QHS   Continuous Infusions: . bupivacaine ON-Q  pain pump    . dextrose 5 % and 0.45% NaCl 10 mL/hr (08/26/16 0740)  . potassium chloride 10 mEq (08/26/16 0759)   PRN Meds:.albuterol, diphenhydrAMINE **OR** diphenhydrAMINE, fentaNYL (SUBLIMAZE) injection, naloxone **AND** sodium chloride flush, ondansetron (ZOFRAN) IV, oxyCODONE, potassium chloride, traMADol  General appearance: alert and cooperative Neurologic: intact Heart: regular rate and rhythm, S1, S2 normal, no murmur, click, rub or gallop Lungs: diminished breath sounds bibasilar Abdomen: soft, non-tender; bowel sounds normal; no masses,  no organomegaly Extremities: extremities normal, atraumatic, no cyanosis or edema and Homans sign is negative, no sign of DVT Wound: no air leak from chest tube   Lab Results: CBC: Recent Labs  08/26/16 0300 08/27/16 0349  WBC 8.5 8.8  HGB 11.4* 10.9*  HCT 36.1 35.0*  PLT 174 178   BMET:  Recent Labs  08/26/16 0300 08/27/16 0349  NA 133* 136  K 3.3* 3.9  CL 103 104  CO2 24 27  GLUCOSE 152* 133*  BUN 7 10  CREATININE 0.53 0.62  CALCIUM 7.8* 8.6*    CMET: Lab Results  Component Value Date   WBC 8.8 08/27/2016   HGB 10.9 (L) 08/27/2016  HCT 35.0 (L) 08/27/2016   PLT 178 08/27/2016   GLUCOSE 133 (H) 08/27/2016   ALT 13 (L) 08/27/2016   AST 19 08/27/2016   NA 136 08/27/2016   K 3.9 08/27/2016   CL 104 08/27/2016   CREATININE 0.62 08/27/2016   BUN 10 08/27/2016   CO2 27 08/27/2016   TSH 1.827 01/19/2011   INR 0.96 08/23/2016   HGBA1C 5.7 (H) 01/19/2011      PT/INR: No results for input(s): LABPROT, INR in the last 72 hours. Radiology: Dg Chest Port 1 View  Result Date: 08/27/2016 CLINICAL DATA:  Status post partial lobectomy of the right lung. EXAM: PORTABLE CHEST 1 VIEW COMPARISON:  08/26/2016 FINDINGS: Right perihilar and lower lung zone opacity noted on the prior study has improved consistent with improved atelectasis. Lungs are hyperexpanded but otherwise clear. No pleural effusion. No pneumothorax. Right  chest tube is stable as is the right internal jugular central venous line. IMPRESSION: 1. Improved right lung aeration with improved atelectasis in the right perihilar and lower lung since the previous day's exam. 2. No acute findings. No evidence of pneumonia, pulmonary edema or pneumothorax. 3. Support apparatus is stable and well positioned. Electronically Signed   By: Lajean Manes M.D.   On: 08/27/2016 07:37     Assessment/Plan: S/P Procedure(s) (LRB): RIGHT VIDEO ASSISTED THORACOSCOPY (VATS)/RIGHT UPPER LOBE WEDGE RESECTION (Right) Mobilize Diuresis D/c foley Keep pca one more day To stepdown     Grace Isaac 08/27/2016 9:06 AM

## 2016-08-28 ENCOUNTER — Inpatient Hospital Stay (HOSPITAL_COMMUNITY): Payer: Medicare Other

## 2016-08-28 LAB — CBC
HCT: 34.2 % — ABNORMAL LOW (ref 36.0–46.0)
Hemoglobin: 10.7 g/dL — ABNORMAL LOW (ref 12.0–15.0)
MCH: 28.7 pg (ref 26.0–34.0)
MCHC: 31.3 g/dL (ref 30.0–36.0)
MCV: 91.7 fL (ref 78.0–100.0)
Platelets: 195 10*3/uL (ref 150–400)
RBC: 3.73 MIL/uL — ABNORMAL LOW (ref 3.87–5.11)
RDW: 14.9 % (ref 11.5–15.5)
WBC: 9.2 10*3/uL (ref 4.0–10.5)

## 2016-08-28 LAB — BASIC METABOLIC PANEL
Anion gap: 4 — ABNORMAL LOW (ref 5–15)
BUN: 14 mg/dL (ref 6–20)
CO2: 26 mmol/L (ref 22–32)
Calcium: 8.5 mg/dL — ABNORMAL LOW (ref 8.9–10.3)
Chloride: 107 mmol/L (ref 101–111)
Creatinine, Ser: 0.68 mg/dL (ref 0.44–1.00)
GFR calc Af Amer: >60 mL/min (ref >60)
GFR calc non Af Amer: >60 mL/min (ref >60)
Glucose, Bld: 101 mg/dL — ABNORMAL HIGH (ref 65–99)
Potassium: 3.8 mmol/L (ref 3.5–5.1)
Sodium: 137 mmol/L (ref 135–145)

## 2016-08-28 NOTE — Progress Notes (Signed)
Patient ID: Lindsey Rose, female   DOB: 06-21-1950, 66 y.o.   MRN: [id number] TCTS DAILY ICU PROGRESS NOTE                   [address]            [place] [address]          [phone]   3 Days Post-Op Procedure(s) (LRB): RIGHT VIDEO ASSISTED THORACOSCOPY (VATS)/RIGHT UPPER LOBE WEDGE RESECTION (Right)  Total Length of Stay:  LOS: 3 days   Subjective: Up to chair feels well  Objective: Vital signs in last 24 hours: Temp:  [97.6 F (36.4 C)-98.4 F (36.9 C)] 97.7 F (36.5 C) (07/15 0803) Pulse Rate:  [62-115] 115 (07/15 0803) Cardiac Rhythm: Normal sinus rhythm (07/15 0800) Resp:  [13-24] 19 (07/15 0803) BP: (100-134)/(57-110) 120/110 (07/15 0803) SpO2:  [89 %-100 %] 98 % (07/15 0803) Weight:  [134 lb 11.2 oz (61.1 kg)] 134 lb 11.2 oz (61.1 kg) (07/15 0600)  Filed Weights   08/25/16 1205 08/27/16 0500 08/28/16 0600  Weight: 135 lb 9.3 oz (61.5 kg) 132 lb 7.9 oz (60.1 kg) 134 lb 11.2 oz (61.1 kg)    Weight change: 2 lb 3.3 oz (1 kg)   Hemodynamic parameters for last 24 hours:    Intake/Output from previous day: 07/14 0701 - 07/15 0700 In: 1200 [P.O.:960; I.V.:240] Out: 340 [Urine:300; Chest Tube:40]  Intake/Output this shift: Total I/O In: 10 [I.V.:10] Out: -   Current Meds: Scheduled Meds: . acetaminophen  1,000 mg Oral Q6H   Or  . acetaminophen (TYLENOL) oral liquid 160 mg/5 mL  1,000 mg Oral Q6H  . amphetamine-dextroamphetamine  20 mg Oral BID WC  . bisacodyl  10 mg Oral Daily  . buPROPion  150 mg Oral Daily  . celecoxib  100 mg Oral BID  . Chlorhexidine Gluconate Cloth  6 each Topical Daily  . colchicine  0.6 mg Oral BID  . enoxaparin (LOVENOX) injection  40 mg Subcutaneous Daily  . fentaNYL   Intravenous Q4H  . gabapentin  600 mg Oral QHS  . predniSONE  20 mg Oral QAC breakfast  . senna-docusate  1 tablet Oral QHS  . sertraline  200 mg Oral Daily  . sodium chloride flush  10-40 mL Intracatheter Q12H  . traZODone  100 mg Oral  QHS   Continuous Infusions: . bupivacaine ON-Q pain pump    . dextrose 5 % and 0.45% NaCl 10 mL/hr at 08/28/16 0800  . potassium chloride Stopped (08/26/16 0859)   PRN Meds:.albuterol, diphenhydrAMINE **OR** diphenhydrAMINE, fentaNYL (SUBLIMAZE) injection, naloxone **AND** sodium chloride flush, ondansetron (ZOFRAN) IV, oxyCODONE, potassium chloride, sodium chloride flush, traMADol  General appearance: alert and cooperative Neurologic: intact Heart: regular rate and rhythm, S1, S2 normal, no murmur, click, rub or gallop Lungs: diminished breath sounds bibasilar Abdomen: soft, non-tender; bowel sounds normal; no masses,  no organomegaly Extremities: extremities normal, atraumatic, no cyanosis or edema and Homans sign is negative, no sign of DVT Wound: no air leak   Lab Results: CBC: Recent Labs  08/27/16 0349 08/28/16 0425  WBC 8.8 9.2  HGB 10.9* 10.7*  HCT 35.0* 34.2*  PLT 178 195   BMET:  Recent Labs  08/27/16 0349 08/28/16 0425  NA 136 137  K 3.9 3.8  CL 104 107  CO2 27 26  GLUCOSE 133* 101*  BUN 10 14  CREATININE 0.62 0.68  CALCIUM 8.6* 8.5*    CMET: Lab Results  Component  Value Date   WBC 9.2 08/28/2016   HGB 10.7 (L) 08/28/2016   HCT 34.2 (L) 08/28/2016   PLT 195 08/28/2016   GLUCOSE 101 (H) 08/28/2016   ALT 13 (L) 08/27/2016   AST 19 08/27/2016   NA 137 08/28/2016   K 3.8 08/28/2016   CL 107 08/28/2016   CREATININE 0.68 08/28/2016   BUN 14 08/28/2016   CO2 26 08/28/2016   TSH 1.827 01/19/2011   INR 0.96 08/23/2016   HGBA1C 5.7 (H) 01/19/2011      PT/INR: No results for input(s): LABPROT, INR in the last 72 hours. Radiology: Dg Chest Port 1 View  Result Date: 08/28/2016 CLINICAL DATA:  Status post VATS. EXAM: PORTABLE CHEST 1 VIEW COMPARISON:  Multiple priors. FINDINGS: RIGHT chest tube good position. Normal cardiomediastinal silhouette. Mild atelectasis is stable. No effusion or pneumothorax. Central venous catheter tip proximal RIGHT atrium.  IMPRESSION: Stable chest.  RIGHT chest tube good position.  No pneumothorax. Electronically Signed   By: Staci Righter M.D.   On: 08/28/2016 07:44     Assessment/Plan: S/P Procedure(s) (LRB): RIGHT VIDEO ASSISTED THORACOSCOPY (VATS)/RIGHT UPPER LOBE WEDGE RESECTION (Right) Mobilize Diuresis d/c tubes/lines Plan for transfer to step-down: see transfer orders Poss home 1-2 days after ct tube out     Grace Isaac 08/28/2016 9:37 AM

## 2016-08-29 ENCOUNTER — Inpatient Hospital Stay (HOSPITAL_COMMUNITY): Payer: Medicare Other

## 2016-08-29 MED ORDER — OXYCODONE HCL 5 MG PO TABS: 5.0000 mg | ORAL_TABLET | Freq: Four times a day (QID) | ORAL | 0 refills | Status: DC | PRN

## 2016-08-29 MED ORDER — PREDNISONE 20 MG PO TABS: 20.0000 mg | ORAL_TABLET | Freq: Every day | ORAL | 1 refills | Status: DC

## 2016-08-29 NOTE — Progress Notes (Signed)
      [address]       [place], [address]             [phone]      4 Days Post-Op Procedure(s) (LRB): RIGHT VIDEO ASSISTED THORACOSCOPY (VATS)/RIGHT UPPER LOBE WEDGE RESECTION (Right) Subjective: She is feeling great this morning. She is walking in the halls independently.   Objective: Vital signs in last 24 hours: Temp:  [97.7 F (36.5 C)-98.3 F (36.8 C)] 97.8 F (36.6 C) (07/16 0444) Pulse Rate:  [70-115] 95 (07/16 0444) Cardiac Rhythm: Normal sinus rhythm (07/16 0444) Resp:  [11-23] 20 (07/16 0444) BP: (105-167)/(51-137) 132/69 (07/16 0444) SpO2:  [93 %-100 %] 93 % (07/16 0444)     Intake/Output from previous day: 07/15 0701 - 07/16 0700 In: 730 [P.O.:600; I.V.:130] Out: 500 [Urine:500] Intake/Output this shift: No intake/output data recorded.  General appearance: alert, cooperative and no distress Heart: regular rate and rhythm, S1, S2 normal, no murmur, click, rub or gallop Lungs: clear to auscultation bilaterally Abdomen: soft, non-tender; bowel sounds normal; no masses,  no organomegaly Extremities: extremities normal, atraumatic, no cyanosis or edema Wound: clean and dry  Lab Results:  Recent Labs  08/27/16 0349 08/28/16 0425  WBC 8.8 9.2  HGB 10.9* 10.7*  HCT 35.0* 34.2*  PLT 178 195   BMET:  Recent Labs  08/27/16 0349 08/28/16 0425  NA 136 137  K 3.9 3.8  CL 104 107  CO2 27 26  GLUCOSE 133* 101*  BUN 10 14  CREATININE 0.62 0.68  CALCIUM 8.6* 8.5*    PT/INR: No results for input(s): LABPROT, INR in the last 72 hours. ABG    Component Value Date/Time   PHART 7.389 08/26/2016 0320   HCO3 26.4 08/26/2016 0320   TCO2 27 06/22/2011 0437   ACIDBASEDEF 0.6 08/23/2016 1357   O2SAT 97.1 08/26/2016 0320   CBG (last 3)   Recent Labs  08/26/16 0752  GLUCAP 81    Assessment/Plan: S/P Procedure(s) (LRB): RIGHT VIDEO ASSISTED THORACOSCOPY (VATS)/RIGHT UPPER LOBE WEDGE RESECTION (Right)  CV-NSR in the 90s. BP  mostly stable, has been elevated at times. Hx of bradycardia and hypotension.  Pulm- tolerating room air. CXR without pneumo on this mornings xray.  Renal-creatinine 0.68, electrolytes okay.  Endo-blood glucose level well controlled Psych-Hx of ADHD, Anxiety, Depression. On multiple agents.   Plan:  Home today with her husband. Discharge instructions given.     LOS: 4 days    Elgie Collard 08/29/2016

## 2016-08-29 NOTE — Progress Notes (Signed)
Lindsey Rose to be D/C'd Home per MD order. Discussed with the patient and all questions fully answered.    VVS, Skin clean, dry and intact without evidence of skin break down, no evidence of skin tears noted.  IV catheter discontinued intact. Site without signs and symptoms of complications. Dressing and pressure applied.  An After Visit Summary was printed and given to the patient.  Patient escorted via [place], and D/C home via private auto.  [name]  08/29/2016 10:10 AM

## 2016-09-05 ENCOUNTER — Encounter (INDEPENDENT_AMBULATORY_CARE_PROVIDER_SITE_OTHER): Payer: Self-pay

## 2016-09-05 ENCOUNTER — Ambulatory Visit (HOSPITAL_BASED_OUTPATIENT_CLINIC_OR_DEPARTMENT_OTHER): Payer: Medicare Other | Admitting: Oncology

## 2016-09-05 VITALS — BP 113/66 | HR 81 | Temp 97.9°F | Resp 18 | Ht 62.5 in | Wt 132.2 lb

## 2016-09-05 DIAGNOSIS — R911 Solitary pulmonary nodule: Secondary | ICD-10-CM | POA: Diagnosis present

## 2016-09-05 DIAGNOSIS — D381 Neoplasm of uncertain behavior of trachea, bronchus and lung: Secondary | ICD-10-CM

## 2016-09-05 DIAGNOSIS — Z4802 Encounter for removal of sutures: Secondary | ICD-10-CM

## 2016-09-05 DIAGNOSIS — Z09 Encounter for follow-up examination after completed treatment for conditions other than malignant neoplasm: Secondary | ICD-10-CM

## 2016-09-05 DIAGNOSIS — Z853 Personal history of malignant neoplasm of breast: Secondary | ICD-10-CM | POA: Diagnosis not present

## 2016-09-05 NOTE — Progress Notes (Signed)
[facility] OFFICE PROGRESS NOTE   Diagnosis: Breast cancer, CDH1 mutation  INTERVAL HISTORY:   Lindsey Rose returns for a scheduled visit. He underwent a chest CT for follow-up of a right upper lobe nodule 07/13/2016. The nodule had increased in size compared to a CT 04/12/2016. A PET scan 07/18/2017 revealed low-level hypermetabolism associated with the sub-solid right upper lobe nodule. No evidence of metastatic disease in the neck, chest, abdomen, or pelvis.  She was referred to Dr. Roxan Hockey and underwent a wedge resection of the right upper lobe nodule on 08/25/2016. The final pathology is pending. Flow cytometry revealed no monoclonal B-cell or aberrant T-cell population.  She has right-sided chest discomfort following surgery. Sutures were removed today.  She was seen in the genetics clinic at [facility]. A referral to a surgeon at [facility] was recommended and she was offered consultation with Dr. Donnie Mesa at [facility]. Lindsey Rose reports she has decided against gastrectomy for now. Her son is being evaluated in [place] to consider gastrectomy.  She recently developed painful nodular skin lesions at the lower extremities. She was diagnosed with polyarteritis nodosa. She reports the lesions resolved with prednisone. She continues prednisone at a dose of 39 g daily. She is followed by rheumatology and [facility].  Objective:  Vital signs in last 24 hours:  Blood pressure 113/66, pulse 81, temperature 97.9 F (36.6 C), temperature source Oral, resp. rate 18, height 5' 2.5" (1.588 m), weight 132 lb 3.2 oz (60 kg), SpO2 98 %.    HEENT: Neck without mass Lymphatics: No cervical, supraclavicular, or axillary nodes Resp: Lungs clear bilaterally Cardio: Regular rate and rhythm GI: No hepatomegaly Vascular: No leg edema  Skin: Healing biopsy sites of the right lower leg, no nodules      Medications: I have reviewed the patient's current medications.  Assessment/Plan: 1.  Bilateral invasive lobular breast cancer diagnosed in 04/1999, status post bilateral mastectomy with implants. She was treated with adjuvant AC chemotherapy, and began tamoxifen in 08/1999. She completed 5 years of tamoxifen, and began Femara in 07/2004. She completed 5 years of Femara in 07/2009.  2.Status post left shoulder surgery with a decreased range of motion at the left shoulder. 3.Chronic arthralgias. 4.Status post right total knee replacement 5. Ongoing tobacco use-we referred her to the lung cancer screening program on 09/03/2015 6. History of diverticulitis  7. 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 Wedge resection of a mildly hypermetabolic right upper lobe nodule 08/25/2016-pathology pending    Disposition:  Lindsey Rose remains in clinical remission from breast cancer. She has decided against a prophylactic gastrectomy. Gastrectomy was recommended here and a surgical referral was recommended by the [facility] genetics team.  She underwent resection of a lung nodule 08/25/2016. The pathology is not yet available. We will contact pathology to obtain the final report.   She has been diagnosed with cutaneous PAN and is followed by rheumatology.  Lindsey Rose like to continue follow-up at the [facility]. She will return for an office visit in 9 months. We are available to see her sooner as needed.  25 minutes were spent with the patient today. The majority of the time was used for counseling and coordination of care.  Donneta Romberg, MD  09/05/2016  12:45 PM

## 2016-09-06 ENCOUNTER — Telehealth: Payer: Self-pay | Admitting: Oncology

## 2016-09-06 NOTE — Telephone Encounter (Signed)
Spoke with patient re April f/u. January f/u cxd.

## 2016-09-07 ENCOUNTER — Telehealth: Payer: Self-pay

## 2016-09-07 NOTE — Telephone Encounter (Signed)
-----   Message from Melrose Nakayama, MD sent at 09/07/2016  4:15 PM EDT ----- Regarding: RE: flying to [facility]: [phone] I talked to Abby Potash about that yesterday  [facility] ----- Message ----- From: Marylen Ponto, LPN Sent: 6/43/5391   4:44 PM To: Melrose Nakayama, MD Subject: flying to [facility]                                   Patient was planning on flying to [place] on 09/26/2016. Is this ok.? Or should she cancel her trip,.please advise SW

## 2016-09-12 ENCOUNTER — Other Ambulatory Visit: Payer: Self-pay | Admitting: Thoracic Surgery (Cardiothoracic Vascular Surgery)

## 2016-09-12 DIAGNOSIS — Z902 Acquired absence of lung [part of]: Secondary | ICD-10-CM

## 2016-09-13 ENCOUNTER — Encounter: Payer: Self-pay | Admitting: Thoracic Surgery (Cardiothoracic Vascular Surgery)

## 2016-09-13 ENCOUNTER — Ambulatory Visit
Admission: RE | Admit: 2016-09-13 | Discharge: 2016-09-13 | Disposition: A | Discharge status: Home / Self Care | Payer: Medicare Other | Source: Ambulatory Visit | Attending: Thoracic Surgery (Cardiothoracic Vascular Surgery) | Admitting: Thoracic Surgery (Cardiothoracic Vascular Surgery)

## 2016-09-13 ENCOUNTER — Ambulatory Visit (INDEPENDENT_AMBULATORY_CARE_PROVIDER_SITE_OTHER): Payer: Self-pay | Admitting: Thoracic Surgery (Cardiothoracic Vascular Surgery)

## 2016-09-13 VITALS — BP 124/83 | HR 80 | Resp 20 | Ht 62.5 in | Wt 135.0 lb

## 2016-09-13 DIAGNOSIS — Z902 Acquired absence of lung [part of]: Secondary | ICD-10-CM

## 2016-09-13 DIAGNOSIS — D381 Neoplasm of uncertain behavior of trachea, bronchus and lung: Secondary | ICD-10-CM

## 2016-09-13 DIAGNOSIS — Z09 Encounter for follow-up examination after completed treatment for conditions other than malignant neoplasm: Secondary | ICD-10-CM

## 2016-09-13 NOTE — Progress Notes (Signed)
[address]       [place] [address]             [phone]     HPI: Lindsey Rose returns for a scheduled postop follow-up visit  Lindsey Rose is a 66 year old woman with a past history of breast cancer, ongoing tobacco abuse, polyarteritis nodosa, attention deficit disorder, arthritis, anemia, anxiety, and CDH 1 gene mutation. She is followed by Dr. Malachy Mood. She had a CT to screen for lung cancer in February of this year. It showed an 18 mm groundglass opacity. A follow-up CT in May showed the lesion was slightly bigger A PET CT was done in June which showed the lesion was mildly active with an SUV of 3.2.  She underwent right VATS wedge resection on 08/25/2016. The nodule turned out to be an atypical lymphoid proliferation suspicious for MALT lymphoma, but no monoclonal B-cell population to be identified. From a surgical standpoint she did well with no complications and was discharged on day 4.  She feels well. She is on chronic narcotics for chronic pain syndrome. She takes him about 4 times a day. She says that yesterday she had a lot of pain around her incision but most the time it is very manageable.  She stopped smoking but is currently using vapor cigarettes.  Past Medical History:  Diagnosis Date  . ADD (attention deficit disorder with hyperactivity)   . ADHD (attention deficit hyperactivity disorder)   . Anemia   . Anxiety   . Arthritis   . Breast cancer (Lindsey Rose)   . Bursitis of hip    bilaterally  . Chronic lower back pain    "radiates down my legs"  . Colon polyps   . Depression   . Disc degeneration   . Diverticulosis   . GERD (gastroesophageal reflux disease)   . Headache   . History of bronchitis   . History of chemotherapy 2001  . Insomnia   . Internal hemorrhoid   . Lung nodule    Right Upper Lobe  . Pneumonia 06/22/11;    "first time"  . Respiratory distress fall 2012   "not on ventilator"  . Sleep apnea      Current Outpatient  Prescriptions  Medication Sig Dispense Refill  . albuterol (PROVENTIL HFA;VENTOLIN HFA) 108 (90 BASE) MCG/ACT inhaler Inhale 2 puffs into the lungs every 6 (six) hours as needed. For shortness of breath and wheezing     . amphetamine-dextroamphetamine (ADDERALL) 20 MG tablet Take 20 mg by mouth 2 (two) times daily.    Marland Kitchen buPROPion (WELLBUTRIN XL) 150 MG 24 hr tablet Take 150 mg by mouth daily.    Marland Kitchen CALCIUM-MAGNESIUM-ZINC PO Take 1 tablet by mouth daily.    . celecoxib (CELEBREX) 100 MG capsule Take 100 mg by mouth 2 (two) times daily.      . Cholecalciferol (VITAMIN D) 2000 units CAPS Take 2,000 Units by mouth daily.    . fluticasone (FLONASE) 50 MCG/ACT nasal spray Place 1 spray into both nostrils daily as needed for allergies.     Marland Kitchen gabapentin (NEURONTIN) 300 MG capsule Take 600 mg by mouth at bedtime.     . Multiple Vitamin (MULITIVITAMIN WITH MINERALS) TABS Take 1 tablet by mouth daily.    Marland Kitchen oxyCODONE (ROXICODONE) 15 MG immediate release tablet Take 15 mg by mouth every 6 (six) hours as needed.  0  . pantoprazole (PROTONIX) 40 MG tablet Take 40 mg by mouth daily as needed.  Acid reflux  0  . polyethylene glycol powder (GLYCOLAX/MIRALAX) powder Take 17 g by mouth daily as needed. constipation    . predniSONE (DELTASONE) 10 MG tablet Take 30 mg by mouth daily.  1  . Respiratory Therapy Supplies DEVI CPAP @@ hs    . sertraline (ZOLOFT) 100 MG tablet Take 200 mg by mouth daily.     . traZODone (DESYREL) 100 MG tablet Take 100 mg by mouth at bedtime.    . vitamin E (VITAMIN E-400) 400 UNIT capsule Take 400 Units by mouth daily.     No current facility-administered medications for this visit.     Physical Exam BP 124/83   Pulse 80   Resp 20   Ht 5' 2.5" (1.588 m)   Wt 135 lb (61.2 kg)   SpO2 96% Comment: RA  BMI 24.32 kg/m  66 year old woman in no acute distress Well-developed and well-nourished Alert and oriented 3 with no focal deficits Lungs clear with equal breath sounds  bilaterally Cardiac regular rate and rhythm normal S1 and S2 Incisions healing well No peripheral edema  Diagnostic Tests: CHEST  2 VIEW  COMPARISON:  PA and lateral chest x-ray of August 29, 2016  FINDINGS: The lungs are well-expanded. The left lung is clear. There is minimal residual parenchymal density in a subpleural location inferior laterally on the right. There is no pneumothorax or pleural effusion. There is stable mild right hilar prominence. The heart and pulmonary vascularity are normal. The bony thorax exhibits no acute abnormality.  IMPRESSION: Further interval decrease in density in the right lung consistent with resolving postsurgical atelectasis. Minimal scarring peripherally remains. The small right pleural effusion has resolved. There is no pneumothorax.   Electronically Signed   By: David  Martinique M.D.   On: 09/13/2016 15:25 I personally reviewed the chest x-ray images and concur with the findings noted above.  Impression: Mrs. Hascall is a 66 year old woman with a history of tobacco abuse, breast cancer, CDH 1 gene mutation, and polyarteritis nodosa. She recently was found to have a mixed solid/sub-solid nodule on a screening CT scan. The nodule grew slightly on an interval scan and PET/CT showed it was hypermetabolic. We did a wedge resection for that on July 12. 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  She has done well postoperatively. She does have some incisional pain but by large as well controlled with her chronic pain regimen. She is not having any problems with her breathing. She has stopped smoking cigarettes but is using cigarettes.   Her activities are unrestricted that she was cautioned to build into new activities gradually. She is have permission to drive on her current pain regimen so from my standpoint it would be okay with me for her to drive on a limited basis if it is okay with her pain  specialist.  Plan: I will be happy to see her back any time the future if I can be of any further assistance with her care.  Melrose Nakayama, MD [facility] [phone]

## 2016-09-13 NOTE — Progress Notes (Signed)
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

## 2016-09-15 ENCOUNTER — Encounter (HOSPITAL_COMMUNITY): Payer: Self-pay

## 2016-10-31 ENCOUNTER — Ambulatory Visit: Payer: Medicare Other

## 2016-10-31 ENCOUNTER — Telehealth: Payer: Self-pay | Admitting: *Deleted

## 2016-10-31 ENCOUNTER — Ambulatory Visit: Payer: Medicare Other | Admitting: Nurse Practitioner

## 2016-10-31 DIAGNOSIS — Z902 Acquired absence of lung [part of]: Secondary | ICD-10-CM

## 2016-10-31 NOTE — Telephone Encounter (Addendum)
Call from pt, she would like to come in to discuss new diagnosis. Informed her Dr. Benay Spice says there is nothing to do for the low grade lymphoma at this time. Pt reports new bruising for past 2 weeks.  OK to work in today, per MD.

## 2016-10-31 NOTE — Telephone Encounter (Signed)
Message from pt requesting to reschedule to next week. Message to schedulers.

## 2016-11-01 ENCOUNTER — Telehealth: Payer: Self-pay | Admitting: Oncology

## 2016-11-01 NOTE — Telephone Encounter (Signed)
Scheduled appt per sch message 9/17 - patient is aware of appt time and date.

## 2016-11-08 ENCOUNTER — Ambulatory Visit: Payer: Medicare Other | Admitting: Nurse Practitioner

## 2016-11-08 ENCOUNTER — Other Ambulatory Visit: Payer: Medicare Other

## 2016-11-10 ENCOUNTER — Other Ambulatory Visit (HOSPITAL_BASED_OUTPATIENT_CLINIC_OR_DEPARTMENT_OTHER): Payer: Medicare Other

## 2016-11-10 ENCOUNTER — Ambulatory Visit (HOSPITAL_BASED_OUTPATIENT_CLINIC_OR_DEPARTMENT_OTHER): Payer: Medicare Other | Admitting: Nurse Practitioner

## 2016-11-10 VITALS — BP 112/61 | HR 84 | Resp 16

## 2016-11-10 DIAGNOSIS — C884 Extranodal marginal zone B-cell lymphoma of mucosa-associated lymphoid tissue [MALT-lymphoma]: Secondary | ICD-10-CM

## 2016-11-10 DIAGNOSIS — Z853 Personal history of malignant neoplasm of breast: Secondary | ICD-10-CM

## 2016-11-10 DIAGNOSIS — Z902 Acquired absence of lung [part of]: Secondary | ICD-10-CM

## 2016-11-10 LAB — CBC WITH DIFFERENTIAL/PLATELET
BASO%: 0.8 % (ref 0.0–2.0)
BASOS ABS: 0.1 10*3/uL (ref 0.0–0.1)
EOS ABS: 0.2 10*3/uL (ref 0.0–0.5)
EOS%: 1.4 % (ref 0.0–7.0)
HCT: 43.4 % (ref 34.8–46.6)
HEMOGLOBIN: 14.2 g/dL (ref 11.6–15.9)
LYMPH%: 39 % (ref 14.0–49.7)
MCH: 29.7 pg (ref 25.1–34.0)
MCHC: 32.8 g/dL (ref 31.5–36.0)
MCV: 90.6 fL (ref 79.5–101.0)
MONO#: 1.1 10*3/uL — ABNORMAL HIGH (ref 0.1–0.9)
MONO%: 8.3 % (ref 0.0–14.0)
NEUT#: 6.8 10*3/uL — ABNORMAL HIGH (ref 1.5–6.5)
NEUT%: 50.5 % (ref 38.4–76.8)
Platelets: 232 10*3/uL (ref 145–400)
RBC: 4.79 10*6/uL (ref 3.70–5.45)
RDW: 15.5 % — AB (ref 11.2–14.5)
WBC: 13.5 10*3/uL — ABNORMAL HIGH (ref 3.9–10.3)
lymph#: 5.2 10*3/uL — ABNORMAL HIGH (ref 0.9–3.3)

## 2016-11-10 NOTE — Progress Notes (Addendum)
  [facility] OFFICE PROGRESS NOTE   Diagnosis:  Breast cancer, CDH1 mutation  INTERVAL HISTORY:   Lindsey Rose returns for follow-up to discuss the pathology report on the lung nodule. She overall feels well. No fevers or sweats. She has a good appetite. She notes easy bruising. No other bleeding. She reports she is currently maintained on prednisone 30 mg daily for treatment of cutaneous polyarteritis nodosa.  Objective:  Vital signs in last 24 hours:  Blood pressure 112/61, pulse 84, resp. rate 16, SpO2 97 %.    HEENT: No thrush or ulcers. Lymphatics: No palpable cervical, supraclavicular, axillary or inguinal lymph nodes. Resp: Lungs clear bilaterally. Cardio: Regular rate and rhythm. GI: Abdomen soft and nontender. No hepatomegaly. Vascular: No leg edema. Skin: Ecchymoses scattered over forearms.   Lab Results:  Lab Results  Component Value Date   WBC 13.5 (H) 11/10/2016   HGB 14.2 11/10/2016   HCT 43.4 11/10/2016   MCV 90.6 11/10/2016   PLT 232 11/10/2016   NEUTROABS 6.8 (H) 11/10/2016    Imaging:  No results found.  Medications: I have reviewed the patient's current medications.  Assessment/Plan: 1. Bilateral invasive lobular breast cancer diagnosed in 04/1999, status post bilateral mastectomy with implants. She was treated with adjuvant AC chemotherapy, and began tamoxifen in 08/1999. She completed 5 years of tamoxifen, and began Femara in 07/2004. She completed 5 years of Femara in 07/2009.  2.Status post left shoulder surgery with a decreased range of motion at the left shoulder. 3.Chronic arthralgias. 4.Status post right total knee replacement 5. Ongoing tobacco use-we referred her to the lung cancer screening program on 09/03/2015 6. History of diverticulitis  7. 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 Wedge resection of a mildly hypermetabolic right upper lobe nodule 08/25/2016-atypical lymphoid proliferation. B-cell clonality by PCR is positive. 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   Disposition: Lindsey Rose appears stable. 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 Dr. Benay Spice reviewed the diagnosis and prognosis with Lindsey Rose at today's visit. No further treatment is recommended. She will be followed with observation.   She has noted easy bruising mainly over the forearms. The platelet count is normal. She understands the bruising is likely related to prednisone. She plans to contact her rheumatologist regarding a prednisone taper.   She will follow-up as scheduled April 2019. She will contact the office in the interim with any problems.  Patient seen with Dr. Benay Spice.    Ned Card ANP/GNP-BC   11/10/2016  4:45 PM  This was a shared visit with Ned Card. We reviewed the right lung biopsy pathology with Lindsey Rose. She has been diagnosed with a MALT lymphoma of the lung. There is no indication for additional treatment.  The easy bruising is likely related to steroid-induced purpura. We recommended she follow-up with her rheumatologist to discuss tapering of the prednisone.  She will be scheduled for a yearly screening chest CT in February.  Julieanne Manson, M.D.

## 2017-03-03 ENCOUNTER — Ambulatory Visit: Payer: Medicare Other | Admitting: Oncology

## 2017-06-06 ENCOUNTER — Inpatient Hospital Stay: Payer: Medicare Other | Attending: Oncology | Admitting: Oncology

## 2017-06-06 VITALS — BP 126/67 | HR 90 | Temp 97.9°F | Resp 18 | Ht 62.5 in | Wt 134.9 lb

## 2017-06-06 DIAGNOSIS — C884 Extranodal marginal zone B-cell lymphoma of mucosa-associated lymphoid tissue [MALT-lymphoma]: Secondary | ICD-10-CM

## 2017-06-06 DIAGNOSIS — Z853 Personal history of malignant neoplasm of breast: Secondary | ICD-10-CM | POA: Insufficient documentation

## 2017-06-06 NOTE — Progress Notes (Signed)
  [place] OFFICE PROGRESS NOTE   Diagnosis: Breast cancer  INTERVAL HISTORY:   Lindsey Rose returns as scheduled.  She feels well.  She has noted fullness over the upper sternum.  No change at the mastectomy site. She underwent a negative upper endoscopy by Dr. Cristina Gong in November 2018. She bruises easily.  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  Objective:  Vital signs in last 24 hours:  Blood pressure 126/67, pulse 90, temperature 97.9 F (36.6 C), temperature source Oral, resp. rate 18, height 5' 2.5" (1.588 m), weight 134 lb 14.4 oz (61.2 kg), SpO2 95 %.    HEENT: Neck without mass Lymphatics: No cervical, supraclavicular, axillary, or inguinal nodes Resp: Lungs clear bilaterally Cardio: Regular rate and rhythm GI: No hepatospleno megaly, no mass, nontender Vascular: No leg edema Breast: Status post bilateral mastectomy with implants in place.  No evidence for chest wall tumor recurrence Musculoskeletal: Examination of the sternum and manubrium is unremarkable    Medications: I have reviewed the patient's current medications.   Assessment/Plan: 1. Bilateral invasive lobular breast cancer diagnosed in 04/1999, status post bilateral mastectomy with implants. She was treated with adjuvant AC chemotherapy, and began tamoxifen in 08/1999. She completed 5 years of tamoxifen, and began Femara in 07/2004. She completed 5 years of Femara in 07/2009.  2.Status post left shoulder surgery with a decreased range of motion at the left shoulder. 3.Chronic arthralgias. 4.Status post right total knee replacement 5. Ongoing tobacco use-we referred her to the lung cancer screening program on 09/03/2015 6. History of diverticulitis  7. Family history of breast cancer and gastric cancer, she is confirmed to have a CDH1 mutation  Negative upper endoscopy November 2018 8. 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 Wedge resection of a mildly hypermetabolic right upper lobe nodule 08/25/2016-atypical lymphoid proliferation. B-cell clonality by PCR is positive. 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    Disposition: [name] remains in clinical remission from breast cancer.  No evidence for development of gastric cancer or regression of the MALT lymphoma.  She plans to locate [place] within the next few months.  She will return for an office visit here in October 2018.  [name] stated that she has decided against a gastrectomy.  15 minutes were spent with the patient today.  The majority of the time was used for counseling and coordination of care.  Betsy Coder, MD  06/06/2017  1:06 PM

## 2017-10-13 IMAGING — CR DG CHEST 2V
2 series · 2 of 2 positions shown · non-contrast
Comparison: 07/18/2016, 07/13/2016, 05/05/2016, 06/21/2011

CLINICAL DATA: Preop right lung surgery, smoker

EXAM:
CHEST  2 VIEW

[w chest pa]
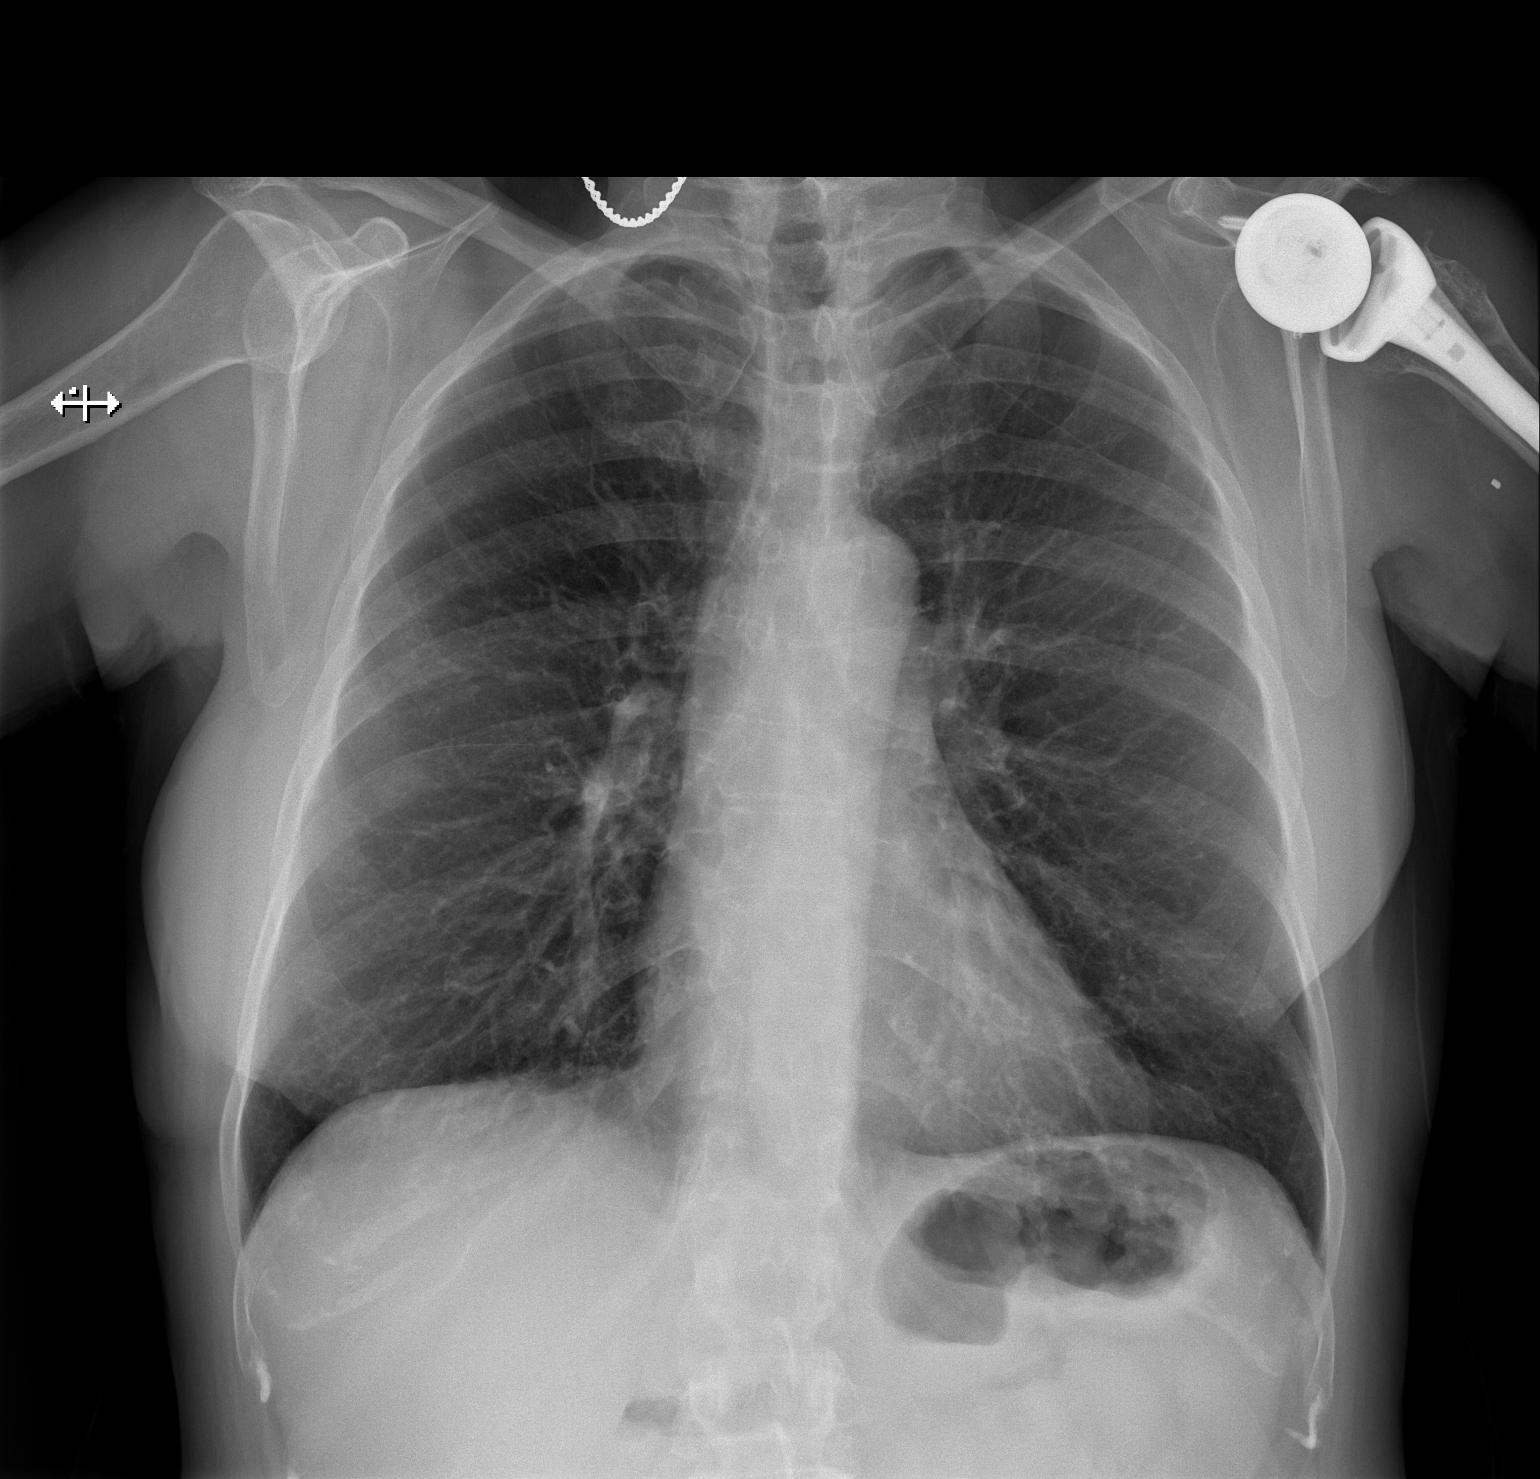

[w chest lat]
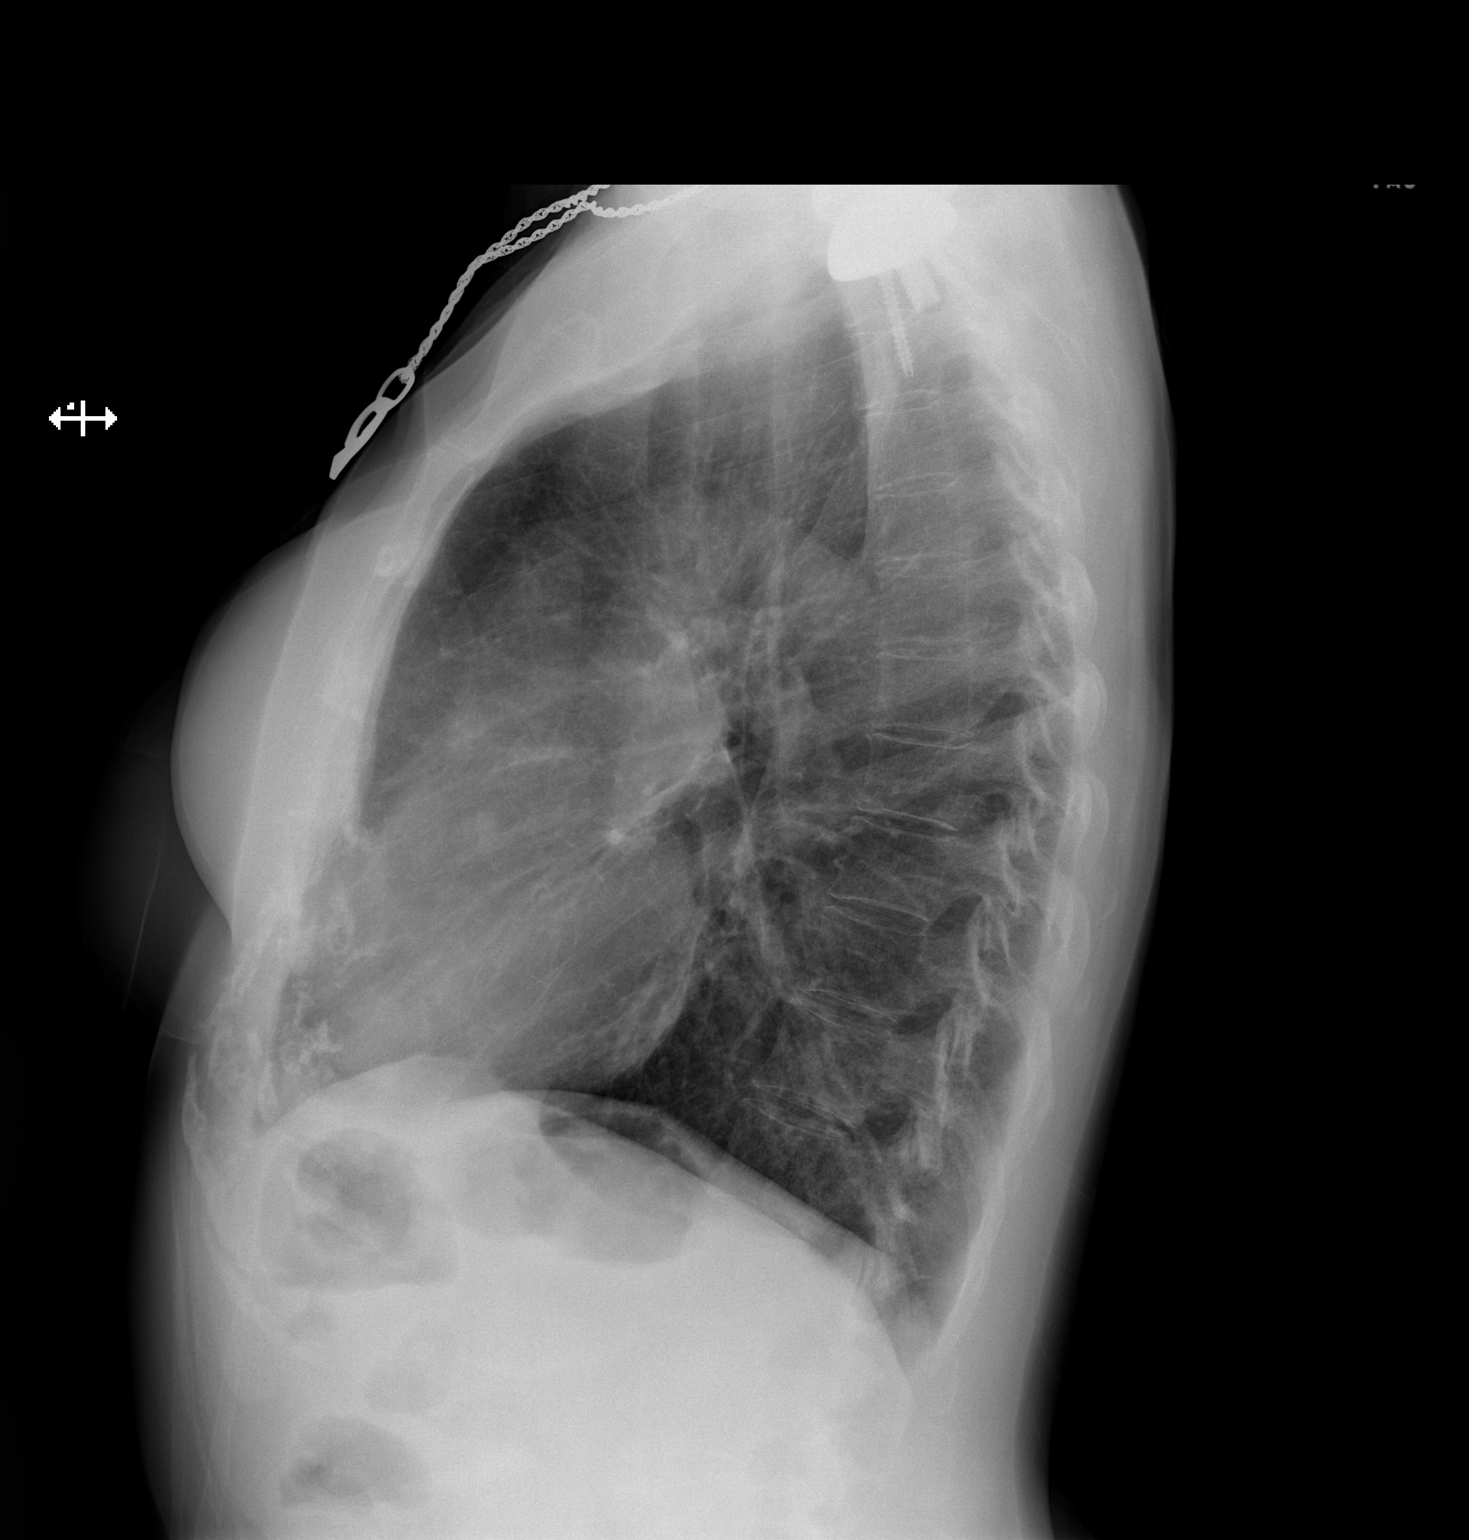

[2 of 2 positions shown; findings below may reference images not displayed]

FINDINGS: No acute pulmonary infiltrate, consolidation, or pleural effusion is
seen. Sub solid right lung nodule on CT is difficult to appreciate
radiographically. Normal heart size. No pneumothorax. Left shoulder
replacement.
IMPRESSION: No active cardiopulmonary disease.

## 2017-10-16 IMAGING — DX DG CHEST 1V PORT
1 series · 1 of 1 positions shown · non-contrast
Comparison: August 25, 2016

CLINICAL DATA: Postoperative change on the right with chest tube in
place

EXAM:
PORTABLE CHEST 1 VIEW

[chest]
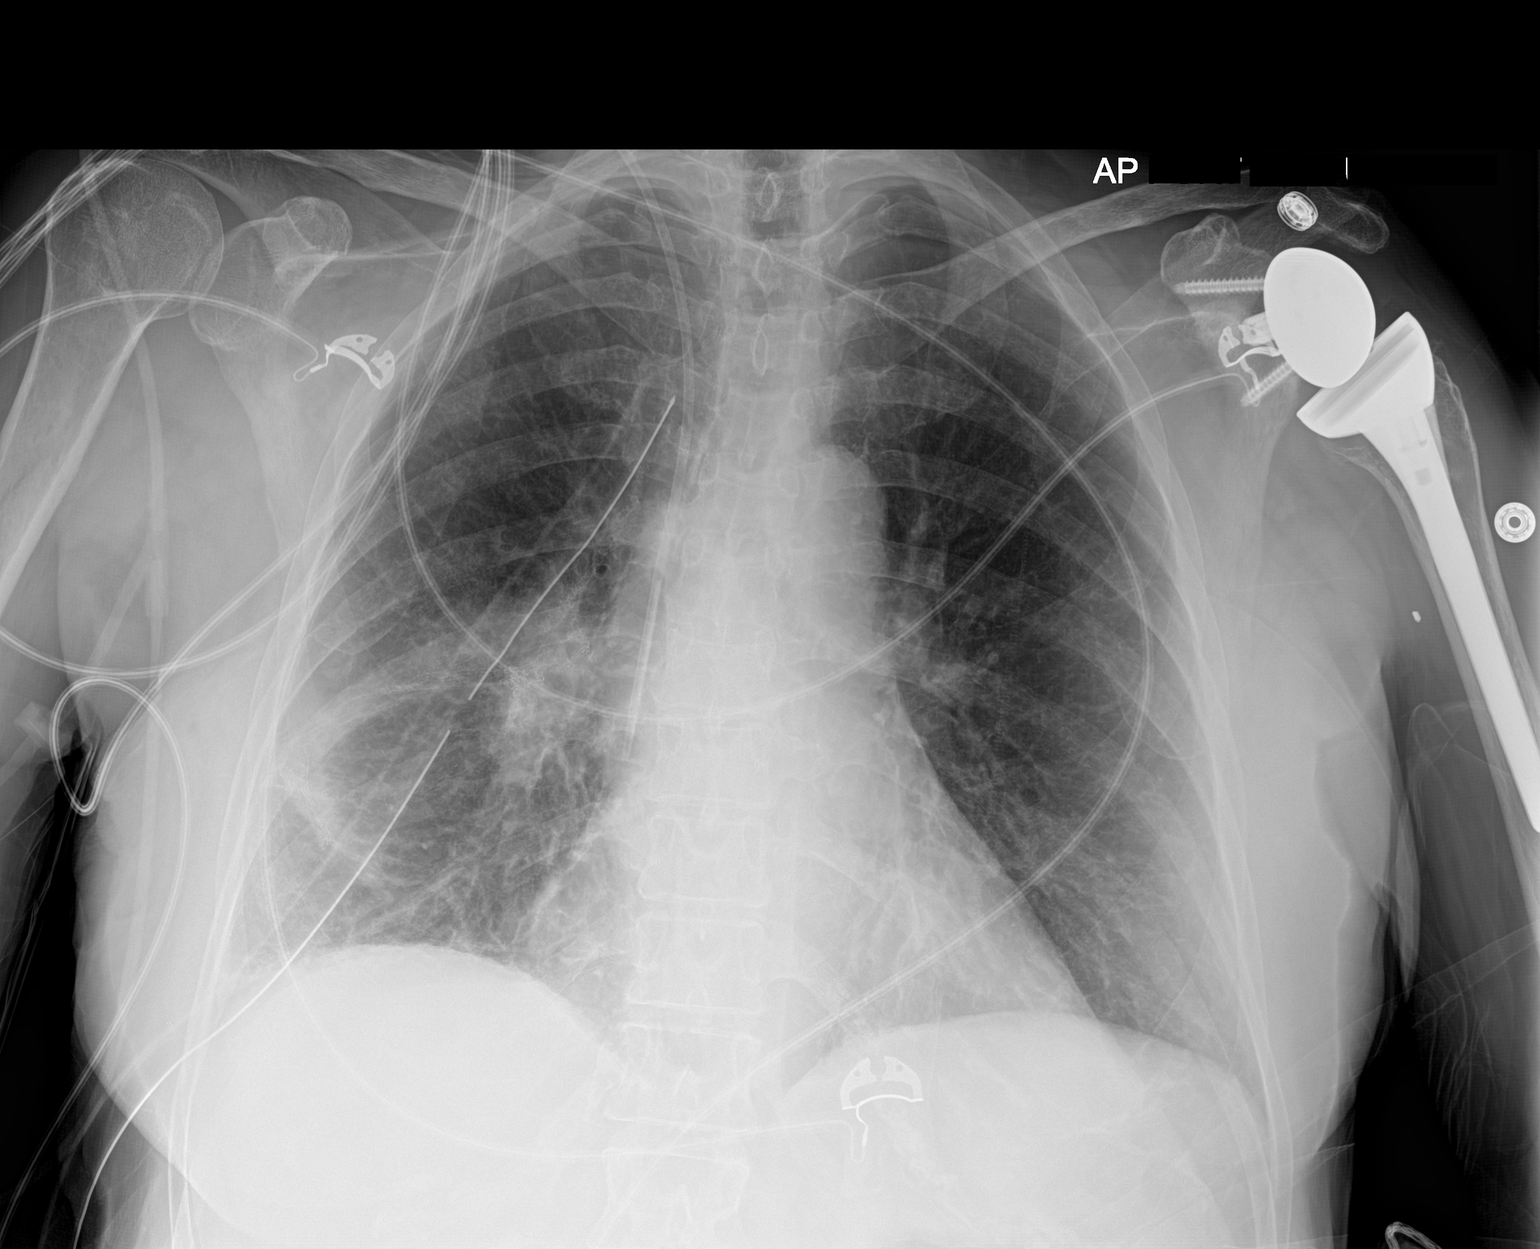

[1 of 1 positions shown; findings below may reference images not displayed]

FINDINGS: Chest tube remains the right. Central catheter tip is in the
superior vena cava near the cavoatrial junction. No pneumothorax is
appreciable. There is postoperative change on the right with areas
of atelectatic change in the right mid lower lung zones, stable.
Left lung is clear. Heart size is within normal limits. Pulmonary
vascularity is normal. No adenopathy. There is a total shoulder
replacement on the left.
IMPRESSION: Postoperative change on the right. Tube and catheter positions
unchanged. Stable atelectasis right mid and lower lung zones. No new
opacity. Stable cardiac silhouette.

## 2017-10-19 IMAGING — CR DG CHEST 2V
2 series · 2 of 2 positions shown · non-contrast
Comparison: 08/28/2016.

CLINICAL DATA: RIGHT-sided chest tube removal.

EXAM:
CHEST  2 VIEW

[chest pa]
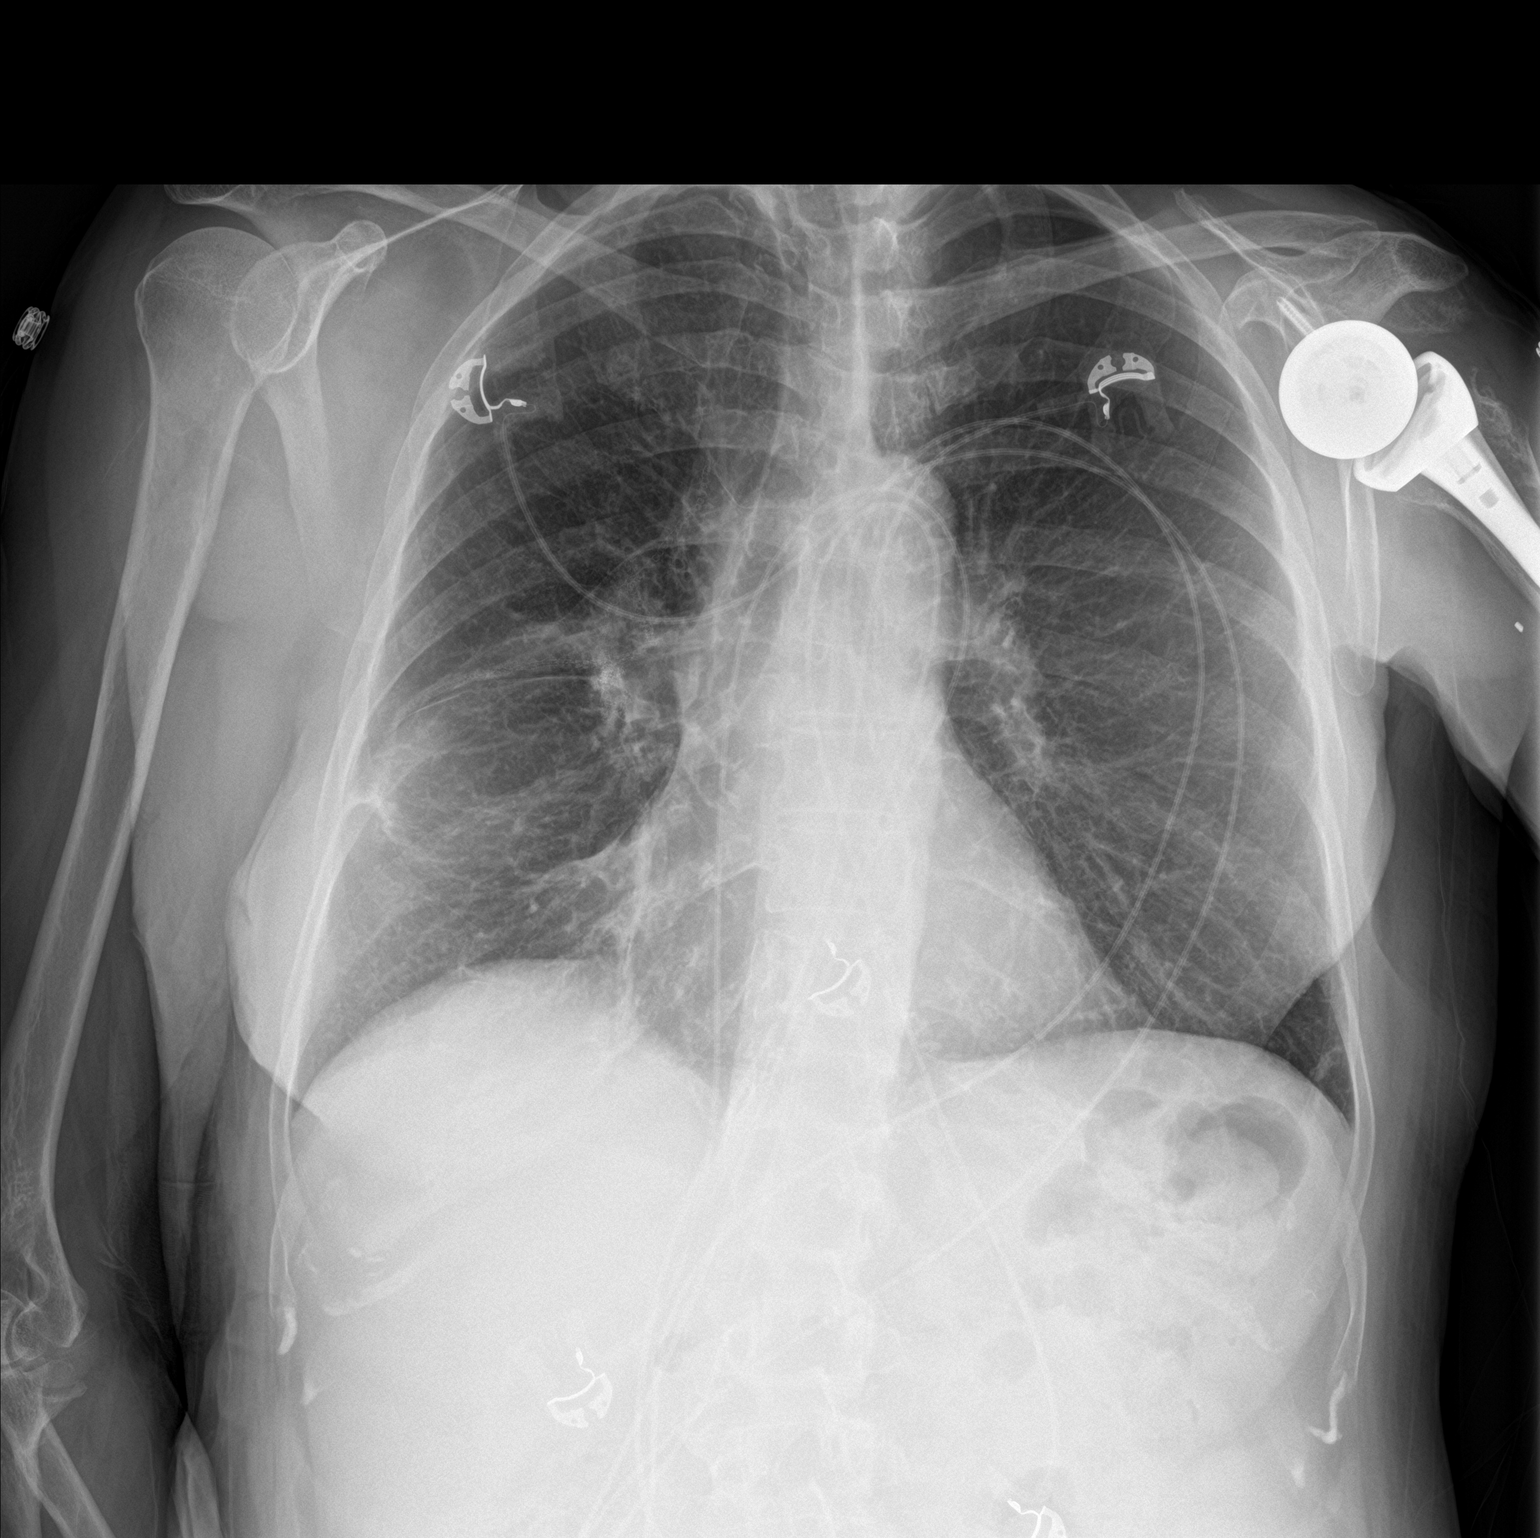

[chest lat]
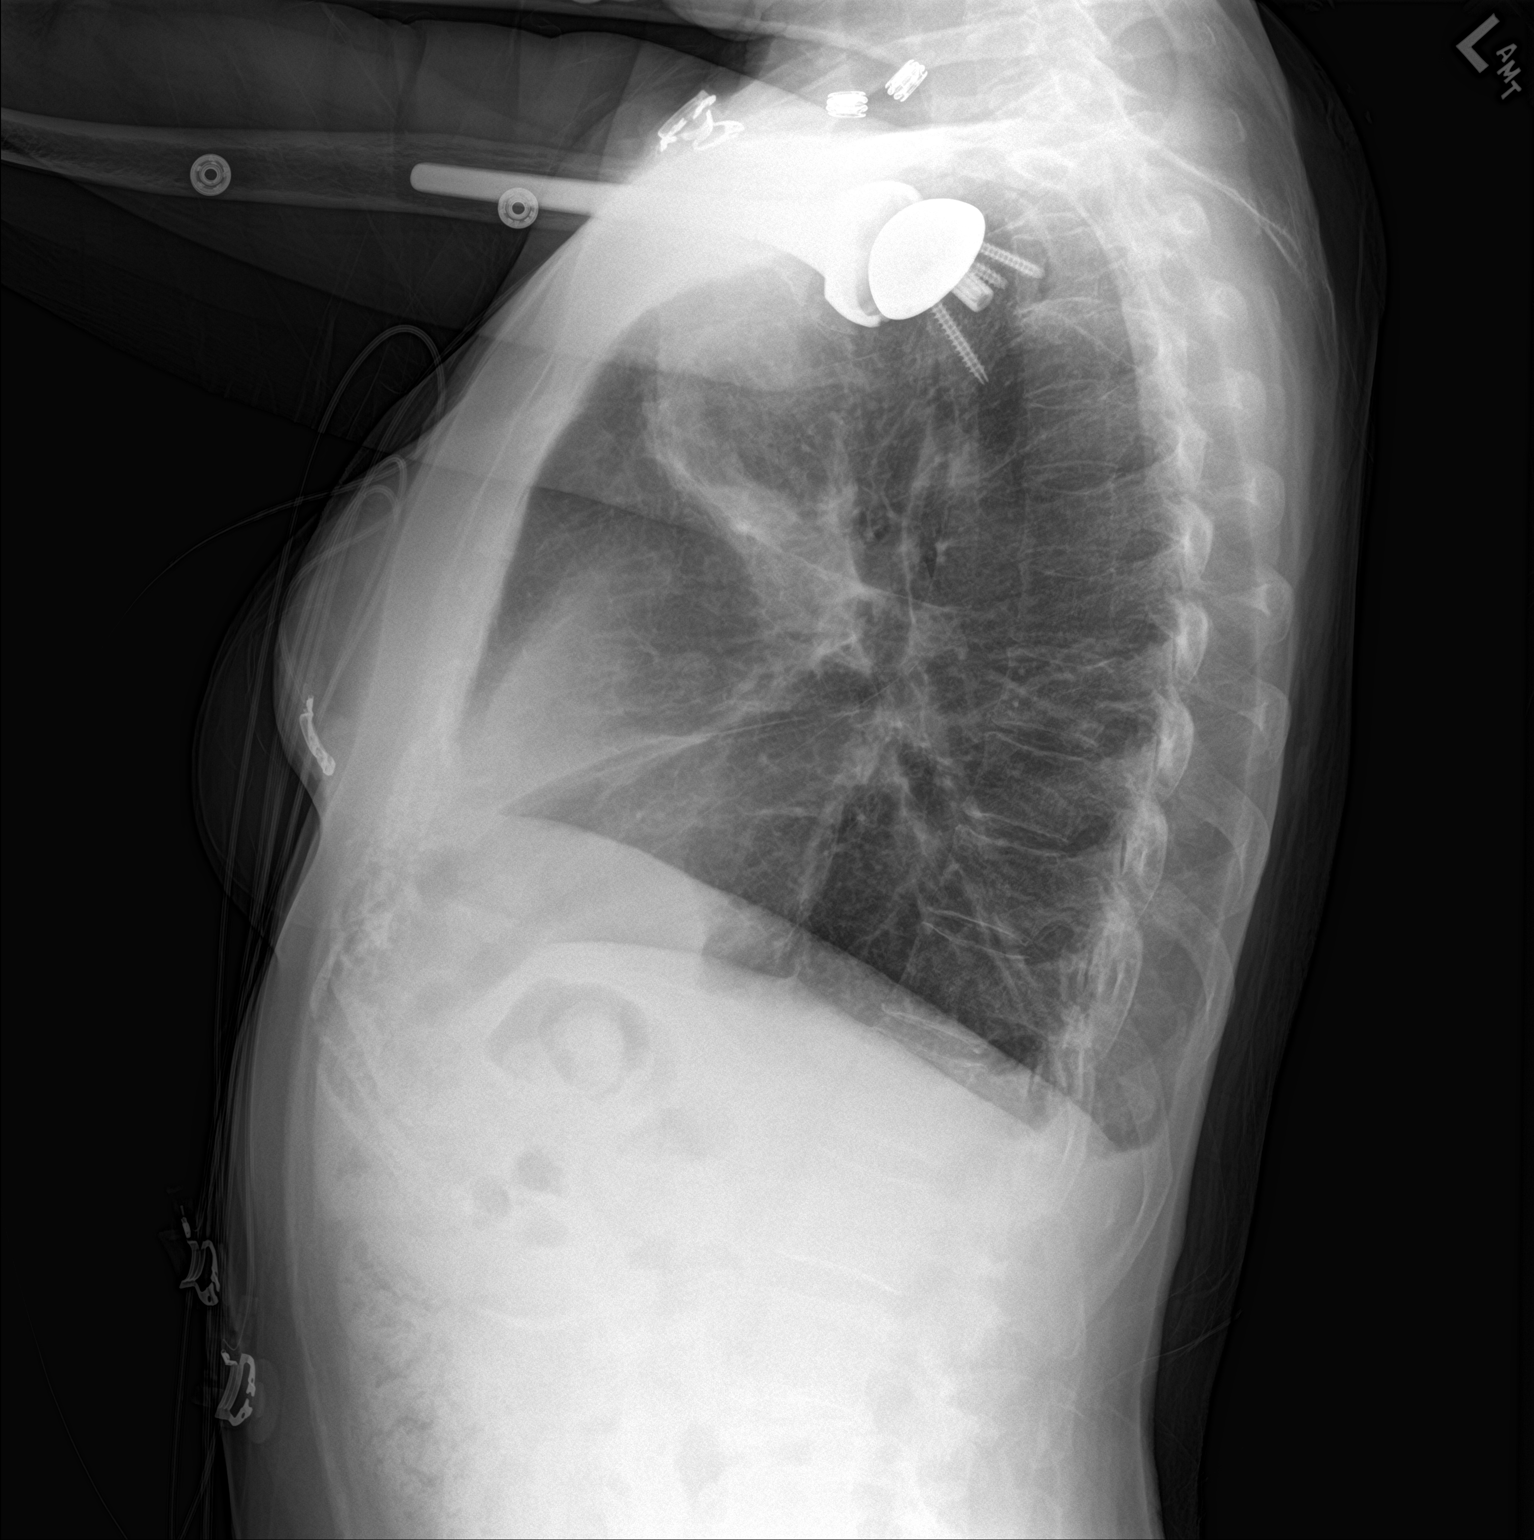

[2 of 2 positions shown; findings below may reference images not displayed]

FINDINGS: Normal cardiomediastinal silhouette. Mild scarring versus
atelectasis RIGHT lung base. RIGHT chest tube removed. No
pneumothorax. LEFT lung clear. LEFT shoulder replacement.
IMPRESSION: Mild scarring.  RIGHT chest tube removed.  No pneumothorax.

## 2017-11-13 ENCOUNTER — Inpatient Hospital Stay: Payer: Medicare Other | Admitting: Oncology

## 2017-12-11 ENCOUNTER — Inpatient Hospital Stay: Payer: Medicare Other | Attending: Oncology | Admitting: Oncology

## 2017-12-11 ENCOUNTER — Telehealth: Payer: Self-pay

## 2017-12-11 VITALS — BP 116/62 | HR 85 | Temp 98.4°F | Resp 17 | Ht 62.5 in | Wt 132.9 lb

## 2017-12-11 DIAGNOSIS — Z853 Personal history of malignant neoplasm of breast: Secondary | ICD-10-CM | POA: Insufficient documentation

## 2017-12-11 DIAGNOSIS — Z8572 Personal history of non-Hodgkin lymphomas: Secondary | ICD-10-CM | POA: Insufficient documentation

## 2017-12-11 NOTE — Telephone Encounter (Signed)
Printed avs and calender of upcoming appointment. Per 10/28 los 

## 2017-12-11 NOTE — Progress Notes (Signed)
  [place] OFFICE PROGRESS NOTE   Diagnosis: Breast cancer, CDH1 dictation  INTERVAL HISTORY:   Lindsey Rose returns as scheduled.  She feels well.  She has not yet relocated to [place].  She has decided against a gastrectomy.  She plans to schedule an endoscopy with Dr. Cristina Gong this year.  She reports intermittent discomfort in the right shoulder.  She has noted a knot near the left shoulder surgical site for the past 3-4 months.  The lesion is not growing.  Objective:  Vital signs in last 24 hours:  Blood pressure 116/62, pulse 85, temperature 98.4 F (36.9 C), temperature source Oral, resp. rate 17, height 5' 2.5" (1.588 m), weight 132 lb 14.4 oz (60.3 kg), SpO2 100 %.    HEENT: Neck without mass Lymphatics: No cervical, supraclavicular, axillary, or inguinal nodes Resp: Distant breath sounds with inspiratory rales at the posterior base bilaterally, no respiratory distress Cardio: Regular rate and rhythm GI: No hepatosplenomegaly, nontender, no mass Vascular: No leg edema Breast: Status post bilateral mastectomy with implants in place.  No evidence for chest wall tumor recurrence. Musculoskeletal: No pain with motion right shoulder, the area of discomfort is at the right scapula.  No mass or tenderness.  1.5 cm soft round cutaneous nodule at the left upper arm near the shoulder scar.     Medications: I have reviewed the patient's current medications.   Assessment/Plan: 1. Bilateral invasive lobular breast cancer diagnosed in 04/1999, status post bilateral mastectomy with implants. She was treated with adjuvant AC chemotherapy, and began tamoxifen in 08/1999. She completed 5 years of tamoxifen, and began Femara in 07/2004. She completed 5 years of Femara in 07/2009.  2.Status post left shoulder surgery with a decreased range of motion at the left shoulder. 3.Chronic arthralgias. 4.Status post right total knee replacement 5. Ongoing tobacco use-we  referred her to the lung cancer screening program on 09/03/2015 6. History of diverticulitis  7. Family history of breast cancer and gastric cancer, she is confirmed to have a CDH1 mutation  Negative upper endoscopy November 2018 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 Wedge resection of a mildly hypermetabolic right upper lobe nodule 08/25/2016-atypical lymphoid proliferation. B-cell clonality by PCR is positive. 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    Disposition: [name] is in clinical remission from breast cancer.  She will schedule an upper endoscopy with Dr. Cristina Gong for this year. The soft round cutaneous lesion at the left upper arm is likely a cyst, though this could represent progression of the MALT lymphoma. She will return for an office visit and reexamination of the left arm in 3 months.  She will contact us in the interim if this lesion enlarges or for new symptoms.  She plans to see orthopedics to evaluate the right shoulder/scapular discomfort.  15 minutes were spent with the patient today.  The majority of the time was used for counseling and coordination of care.  Betsy Coder, MD  12/11/2017  12:39 PM

## 2018-01-15 ENCOUNTER — Telehealth: Payer: Self-pay | Admitting: *Deleted

## 2018-01-15 NOTE — Telephone Encounter (Signed)
Called patient to offer her an appointment in the next 2 weeks with Dr. Benay Spice. She reports the pain is better and does not feel she needs to be seen. She will call if it returns.

## 2018-01-15 NOTE — Telephone Encounter (Signed)
Call from Dr. Cristina Gong that he saw patient for increased LUQ pain which is worse when she bends over. Upon exam, he noted significant point tenderness at 10th rib and wanted Dr. Benay Spice aware in case he wants to evaluate with xray/scan. He does not feel her pain is a GI issue. Patient will most likely be calling as well. Next visit 03/13/2018.

## 2018-02-22 ENCOUNTER — Telehealth: Payer: Self-pay | Admitting: *Deleted

## 2018-02-22 NOTE — Telephone Encounter (Signed)
Called to report the "knot" MD had noted at last visit at her left shoulder has increased in size to that of a large marble and the "knot" she had at left rib cage (anteriolateral) is larger as well. States this one is larger than a large marble. Ribcage mass is painful. Both are fairly soft. Expresses she is very worried and asking for Dr. Benay Spice to see her as soon as he is able. Informed her that RN will relay her call to MD for advice and call her back.

## 2018-02-26 ENCOUNTER — Telehealth: Payer: Self-pay | Admitting: *Deleted

## 2018-02-26 NOTE — Telephone Encounter (Signed)
Called patient to see if she wanted to see Dr. Benay Spice tomorrow at 4pm. She reports no, she went to ED and had CT scan and was told it was not cancer. She can wait to her regular appointment.

## 2018-03-13 ENCOUNTER — Inpatient Hospital Stay: Payer: Medicare Other | Attending: Oncology | Admitting: Oncology

## 2018-03-13 VITALS — BP 105/83 | HR 82 | Temp 98.1°F | Resp 18 | Ht 62.5 in | Wt 130.4 lb

## 2018-03-13 DIAGNOSIS — Z803 Family history of malignant neoplasm of breast: Secondary | ICD-10-CM

## 2018-03-13 DIAGNOSIS — Z170 Estrogen receptor positive status [ER+]: Secondary | ICD-10-CM | POA: Diagnosis not present

## 2018-03-13 DIAGNOSIS — Z720 Tobacco use: Secondary | ICD-10-CM | POA: Insufficient documentation

## 2018-03-13 DIAGNOSIS — Z8572 Personal history of non-Hodgkin lymphomas: Secondary | ICD-10-CM | POA: Diagnosis not present

## 2018-03-13 DIAGNOSIS — Z853 Personal history of malignant neoplasm of breast: Secondary | ICD-10-CM

## 2018-03-13 DIAGNOSIS — Z9221 Personal history of antineoplastic chemotherapy: Secondary | ICD-10-CM | POA: Diagnosis not present

## 2018-03-13 DIAGNOSIS — Z96651 Presence of right artificial knee joint: Secondary | ICD-10-CM

## 2018-03-13 DIAGNOSIS — Z800 Family history of malignant neoplasm of digestive organs: Secondary | ICD-10-CM

## 2018-03-13 NOTE — Progress Notes (Signed)
  [place] OFFICE PROGRESS NOTE   Diagnosis: Breast cancer, [facility] 1 mutation  INTERVAL HISTORY:   Lindsey Rose returns for a scheduled visit.  She reports no change in the cutaneous nodule near the left shoulder.  She feels well.  She "pulled a muscle "at the left subcostal region and was evaluated at [facility].  A CT of the chest 02/23/2018 revealed no acute change.  She underwent an upper endoscopy by Dr. Janeece Riggers 02/16/2018.  There was minimal antral erythema.  Biopsies from the stomach revealed no Helicobacter.  There is minimal chronic inactive gastritis.  Objective:  Vital signs in last 24 hours:  Blood pressure 105/83, pulse 82, temperature 98.1 F (36.7 C), temperature source Oral, resp. rate 18, height 5' 2.5" (1.588 m), weight 130 lb 6.4 oz (59.1 kg), SpO2 100 %.    HEENT: Neck without mass Lymphatics: No cervical, supraclavicular, axillary, or inguinal nodes Resp: Lungs clear bilaterally Cardio: Regular rate and rhythm GI: No hepatosplenomegaly, no mass, no apparent hernia Vascular: No leg edema  Skin: Soft mobile 1.5 cm cutaneous nodule at the medial left shoulder Breast: Status post bilateral mastectomy with implants in place.  No evidence for chest wall tumor recurrence. Lab Results:  Lab Results  Component Value Date   WBC 13.5 (H) 11/10/2016   HGB 14.2 11/10/2016   HCT 43.4 11/10/2016   MCV 90.6 11/10/2016   PLT 232 11/10/2016   NEUTROABS 6.8 (H) 11/10/2016    CMP  Lab Results  Component Value Date   NA 137 08/28/2016   K 3.8 08/28/2016   CL 107 08/28/2016   CO2 26 08/28/2016   GLUCOSE 101 (H) 08/28/2016   BUN 14 08/28/2016   CREATININE 0.68 08/28/2016   CALCIUM 8.5 (L) 08/28/2016   PROT 5.3 (L) 08/27/2016   ALBUMIN 2.5 (L) 08/27/2016   AST 19 08/27/2016   ALT 13 (L) 08/27/2016   ALKPHOS 67 08/27/2016   BILITOT 0.2 (L) 08/27/2016   GFRNONAA >60 08/28/2016   GFRAA >60 08/28/2016    Medications: I have reviewed the patient's current  medications.   Assessment/Plan: 1. Bilateral invasive lobular breast cancer diagnosed in 04/1999, status post bilateral mastectomy with implants. She was treated with adjuvant AC chemotherapy, and began tamoxifen in 08/1999. She completed 5 years of tamoxifen, and began Femara in 07/2004. She completed 5 years of Femara in 07/2009.  2.Status post left shoulder surgery with a decreased range of motion at the left shoulder. 3.Chronic arthralgias. 4.Status post right total knee replacement 5. Ongoing tobacco use-we referred her to the lung cancer screening program on 09/03/2015 6. History of diverticulitis  7. 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 Wedge resection of a mildly hypermetabolic right upper lobe nodule 08/25/2016-atypical lymphoid proliferation. B-cell clonality by PCR is positive. 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    Disposition: Lindsey Rose remains in clinical remission from breast cancer and MALT lymphoma.  She will return for an office visit in 6 months.  She will continue endoscopic surveillance with Dr. Cristina Gong.  The round cutaneous lesion near the left shoulder is likely a benign finding.  Lindsey Coder, MD  03/13/2018  12:51 PM

## 2018-03-13 NOTE — Patient Instructions (Signed)
Please provide copy of your Advanced Directive/Living Will to have scanned into chart.

## 2018-03-14 ENCOUNTER — Telehealth: Payer: Self-pay | Admitting: Oncology

## 2018-03-14 NOTE — Telephone Encounter (Signed)
Scheduled appt per 1/28 los - sent reminder letter in the mail with appt date and time - f/ui in 6 months.

## 2018-08-01 ENCOUNTER — Telehealth: Payer: Self-pay | Admitting: Nurse Practitioner

## 2018-08-01 NOTE — Telephone Encounter (Signed)
Called patient regarding rescheduling an appointment, July appointments have now been rescheduled for 06/23 with providers approval.

## 2018-08-07 ENCOUNTER — Inpatient Hospital Stay: Payer: Medicare Other | Attending: Oncology | Admitting: Nurse Practitioner

## 2018-08-07 ENCOUNTER — Other Ambulatory Visit: Payer: Self-pay

## 2018-08-07 ENCOUNTER — Encounter: Payer: Self-pay | Admitting: Nurse Practitioner

## 2018-08-07 VITALS — BP 130/68 | HR 78 | Temp 98.2°F | Resp 18 | Ht 62.5 in | Wt 124.0 lb

## 2018-08-07 DIAGNOSIS — Z853 Personal history of malignant neoplasm of breast: Secondary | ICD-10-CM | POA: Diagnosis present

## 2018-08-07 DIAGNOSIS — Z8572 Personal history of non-Hodgkin lymphomas: Secondary | ICD-10-CM

## 2018-08-07 NOTE — Progress Notes (Addendum)
  [place] OFFICE PROGRESS NOTE   Diagnosis:  Breast cancer, [facility] 1 mutation  INTERVAL HISTORY:   Lindsey Rose returns prior to scheduled follow-up due to concern regarding weight loss.  Her appetite is stable.  No fevers or sweats.  Stable joint pains.  No shortness of breath or cough.  She denies abdominal pain.  She developed diarrhea in January after stopping her probiotic.  She resumed the probiotic and the diarrhea resolved.  No nausea or vomiting.  No urinary symptoms.  No dysphagia.  She denies bleeding.  She has been "stressed" recently due to considering a move.  Objective:  Vital signs in last 24 hours:  Blood pressure 130/68, pulse 78, temperature 98.2 F (36.8 C), temperature source Oral, resp. rate 18, height 5' 2.5" (1.588 m), weight 124 lb (56.2 kg), SpO2 98 %.    HEENT: No mass lesion within the oral cavity.  Neck without mass. Lymphatics: No palpable cervical, supraclavicular, axillary or inguinal lymph nodes. Resp: Lungs clear bilaterally. Cardio: Regular rate and rhythm. GI: Abdomen soft and nontender.  No hepatomegaly. Vascular: No leg edema.  Skin: Soft mobile approximate 1-1.5 cm cutaneous nodule at the medial left shoulder. Breast: Status post bilateral mastectomy with implants in place.  No evidence for chest wall recurrence.   Lab Results:  Lab Results  Component Value Date   WBC 13.5 (H) 11/10/2016   HGB 14.2 11/10/2016   HCT 43.4 11/10/2016   MCV 90.6 11/10/2016   PLT 232 11/10/2016   NEUTROABS 6.8 (H) 11/10/2016    Imaging:  No results found.  Medications: I have reviewed the patient's current medications.  Assessment/Plan: 1. Bilateral invasive lobular breast cancer diagnosed in 04/1999, status post bilateral mastectomy with implants. She was treated with adjuvant AC chemotherapy, and began tamoxifen in 08/1999. She completed 5 years of tamoxifen, and began Femara in 07/2004. She completed 5 years of Femara in 07/2009.   2.Status post left shoulder surgery with a decreased range of motion at the left shoulder. 3.Chronic arthralgias. 4.Status post right total knee replacement 5. Ongoing tobacco use-we referred her to the lung cancer screening program on 09/03/2015 6. History of diverticulitis  7. 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 Wedge resection of a mildly hypermetabolic right upper lobe nodule 08/25/2016-atypical lymphoid proliferation. B-cell clonality by PCR is positive. 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   Disposition: Lindsey Rose appears stable.  She remains in clinical remission from breast cancer and MALT lymphoma.    She has lost about 6 pounds over the past 6 months.  Appetite is unchanged.  Etiology of weight loss unclear.  Plan to continue to monitor.  We will see her back in 3 months with a repeat weight.  She will contact the office in the interim with any problems.  Patient seen with Dr. Benay Spice.  Ned Card ANP/GNP-BC   08/07/2018  3:34 PM This was a shared visit with Ned Card.  Lindsey Rose was interviewed and examined.  She is in remission from breast cancer and the MALT lymphoma.  We have a low clinical suspicion for a progressive malignancy.  Julieanne Manson, MD

## 2018-09-13 ENCOUNTER — Ambulatory Visit: Payer: Medicare Other | Admitting: Oncology

## 2018-11-08 ENCOUNTER — Encounter (INDEPENDENT_AMBULATORY_CARE_PROVIDER_SITE_OTHER): Payer: Self-pay

## 2018-11-08 ENCOUNTER — Other Ambulatory Visit: Payer: Self-pay

## 2018-11-08 ENCOUNTER — Inpatient Hospital Stay: Payer: Medicare Other | Attending: Oncology | Admitting: Oncology

## 2018-11-08 VITALS — BP 117/60 | HR 86 | Temp 97.8°F | Resp 17 | Ht 62.5 in | Wt 116.8 lb

## 2018-11-08 DIAGNOSIS — Z87891 Personal history of nicotine dependence: Secondary | ICD-10-CM

## 2018-11-08 DIAGNOSIS — Z853 Personal history of malignant neoplasm of breast: Secondary | ICD-10-CM

## 2018-11-08 DIAGNOSIS — Z800 Family history of malignant neoplasm of digestive organs: Secondary | ICD-10-CM | POA: Insufficient documentation

## 2018-11-08 DIAGNOSIS — F17210 Nicotine dependence, cigarettes, uncomplicated: Secondary | ICD-10-CM | POA: Diagnosis not present

## 2018-11-08 DIAGNOSIS — Z9221 Personal history of antineoplastic chemotherapy: Secondary | ICD-10-CM | POA: Diagnosis not present

## 2018-11-08 DIAGNOSIS — Z8579 Personal history of other malignant neoplasms of lymphoid, hematopoietic and related tissues: Secondary | ICD-10-CM | POA: Diagnosis not present

## 2018-11-08 DIAGNOSIS — Z9013 Acquired absence of bilateral breasts and nipples: Secondary | ICD-10-CM | POA: Diagnosis not present

## 2018-11-08 DIAGNOSIS — R634 Abnormal weight loss: Secondary | ICD-10-CM | POA: Insufficient documentation

## 2018-11-08 DIAGNOSIS — Z23 Encounter for immunization: Secondary | ICD-10-CM | POA: Insufficient documentation

## 2018-11-08 DIAGNOSIS — Z803 Family history of malignant neoplasm of breast: Secondary | ICD-10-CM | POA: Insufficient documentation

## 2018-11-08 DIAGNOSIS — M2550 Pain in unspecified joint: Secondary | ICD-10-CM | POA: Insufficient documentation

## 2018-11-08 MED ORDER — INFLUENZA VAC A&B SA ADJ QUAD 0.5 ML IM PRSY
0.5000 mL | PREFILLED_SYRINGE | Freq: Once | INTRAMUSCULAR | Status: AC
  Administered 2018-11-08: 0.5 mL via INTRAMUSCULAR

## 2018-11-08 NOTE — Progress Notes (Signed)
  [facility] OFFICE PROGRESS NOTE   Diagnosis: Breast cancer  INTERVAL HISTORY:   Lindsey Rose returns as scheduled.  She feels well.  She feels the cystic lesion of the left shoulder is larger.  No pain or nausea.  She has a good appetite.  She continues smoking.  She relates weight loss to "stress ".  Objective:  Vital signs in last 24 hours:  Blood pressure 117/60, pulse 86, temperature 97.8 F (36.6 C), temperature source Temporal, resp. rate 17, height 5' 2.5" (1.588 m), weight 116 lb 12.8 oz (53 kg), SpO2 95 %.    HEENT: Neck without mass Lymphatics: No cervical, supraclavicular, axillary, or inguinal nodes Resp: Lungs clear bilaterally Cardio: Regular rate and rhythm GI: No hepatosplenomegaly, no mass, nontender Vascular: No leg edema Musculoskeletal: Approximately 2 cm soft smooth cutaneous lesion overlying the left shoulder Breast: Status post bilateral mastectomy with implants in place, no evidence for chest wall tumor recurrence    Lab Results:  Lab Results  Component Value Date   WBC 13.5 (H) 11/10/2016   HGB 14.2 11/10/2016   HCT 43.4 11/10/2016   MCV 90.6 11/10/2016   PLT 232 11/10/2016   NEUTROABS 6.8 (H) 11/10/2016    CMP  Lab Results  Component Value Date   NA 137 08/28/2016   K 3.8 08/28/2016   CL 107 08/28/2016   CO2 26 08/28/2016   GLUCOSE 101 (H) 08/28/2016   BUN 14 08/28/2016   CREATININE 0.68 08/28/2016   CALCIUM 8.5 (L) 08/28/2016   PROT 5.3 (L) 08/27/2016   ALBUMIN 2.5 (L) 08/27/2016   AST 19 08/27/2016   ALT 13 (L) 08/27/2016   ALKPHOS 67 08/27/2016   BILITOT 0.2 (L) 08/27/2016   GFRNONAA >60 08/28/2016   GFRAA >60 08/28/2016    Medications: I have reviewed the patient's current medications.   Assessment/Plan: 1. Bilateral invasive lobular breast cancer diagnosed in 04/1999, status post bilateral mastectomy with implants. She was treated with adjuvant AC chemotherapy, and began tamoxifen in 08/1999. She  completed 5 years of tamoxifen, and began Femara in 07/2004. She completed 5 years of Femara in 07/2009.  2.Status post left shoulder surgery with a decreased range of motion at the left shoulder. 3.Chronic arthralgias. 4.Status post right total knee replacement 5. Ongoing tobacco use-we referred her to the lung cancer screening program on 09/03/2015 6. History of diverticulitis  7. 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 Wedge resection of a mildly hypermetabolic right upper lobe nodule 08/25/2016-atypical lymphoid proliferation. B-cell clonality by PCR is positive. 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     Disposition: Lindsey Rose appears unchanged, but she continues to lose weight.  I have a low clinical suspicion for recurrence of breast cancer or development of a new malignancy.  The lesion at the left shoulder is likely a benign cyst. She will endoscopic follow-up with Dr. Cristina Gong.  She will return for an office visit in 3 months.  Lindsey Rose received an influenza vaccine today.  Betsy Coder, MD  11/08/2018  4:13 PM

## 2018-11-09 ENCOUNTER — Telehealth: Payer: Self-pay | Admitting: Oncology

## 2018-11-09 MED ORDER — PEGFILGRASTIM-JMDB 6 MG/0.6ML ~~LOC~~ SOSY
PREFILLED_SYRINGE | SUBCUTANEOUS | Status: AC
  Filled 2018-11-09: qty 0.6

## 2018-11-09 NOTE — Telephone Encounter (Signed)
Called and spoke with patient.confirmed appt  °

## 2019-01-14 ENCOUNTER — Telehealth: Payer: Self-pay | Admitting: Nurse Practitioner

## 2019-01-14 NOTE — Telephone Encounter (Signed)
Returned patient's phone call regarding rescheduling 12/28 appointment, per patient's request appointment has moved to 12/07.

## 2019-01-21 ENCOUNTER — Encounter: Payer: Self-pay | Admitting: Nurse Practitioner

## 2019-01-21 ENCOUNTER — Other Ambulatory Visit: Payer: Self-pay

## 2019-01-21 ENCOUNTER — Telehealth: Payer: Self-pay | Admitting: Nurse Practitioner

## 2019-01-21 ENCOUNTER — Inpatient Hospital Stay: Payer: Medicare Other | Attending: Oncology | Admitting: Nurse Practitioner

## 2019-01-21 VITALS — BP 122/64 | HR 79 | Temp 98.3°F | Resp 18 | Ht 62.0 in | Wt 113.0 lb

## 2019-01-21 DIAGNOSIS — Z800 Family history of malignant neoplasm of digestive organs: Secondary | ICD-10-CM | POA: Diagnosis not present

## 2019-01-21 DIAGNOSIS — Z9221 Personal history of antineoplastic chemotherapy: Secondary | ICD-10-CM | POA: Insufficient documentation

## 2019-01-21 DIAGNOSIS — F17210 Nicotine dependence, cigarettes, uncomplicated: Secondary | ICD-10-CM | POA: Diagnosis not present

## 2019-01-21 DIAGNOSIS — M898X1 Other specified disorders of bone, shoulder: Secondary | ICD-10-CM | POA: Diagnosis not present

## 2019-01-21 DIAGNOSIS — M300 Polyarteritis nodosa: Secondary | ICD-10-CM | POA: Diagnosis not present

## 2019-01-21 DIAGNOSIS — M25512 Pain in left shoulder: Secondary | ICD-10-CM | POA: Diagnosis not present

## 2019-01-21 DIAGNOSIS — Z803 Family history of malignant neoplasm of breast: Secondary | ICD-10-CM | POA: Diagnosis not present

## 2019-01-21 DIAGNOSIS — Z148 Genetic carrier of other disease: Secondary | ICD-10-CM | POA: Insufficient documentation

## 2019-01-21 DIAGNOSIS — Z853 Personal history of malignant neoplasm of breast: Secondary | ICD-10-CM | POA: Diagnosis present

## 2019-01-21 DIAGNOSIS — R634 Abnormal weight loss: Secondary | ICD-10-CM | POA: Insufficient documentation

## 2019-01-21 DIAGNOSIS — M2550 Pain in unspecified joint: Secondary | ICD-10-CM | POA: Diagnosis not present

## 2019-01-21 DIAGNOSIS — Z9013 Acquired absence of bilateral breasts and nipples: Secondary | ICD-10-CM | POA: Diagnosis not present

## 2019-01-21 NOTE — Telephone Encounter (Signed)
Scheduled appt per 12/7 los - gave patient AVS and calender per los.

## 2019-01-21 NOTE — Progress Notes (Addendum)
  [place] OFFICE PROGRESS NOTE   Diagnosis: Breast cancer  INTERVAL HISTORY:   Lindsey Rose returns prior to scheduled follow-up for evaluation of a cystic lesion at the left upper arm.  She feels the lesion has increased in size.  She has been having pain at the left scapula over the past 2 months.  She continues to lose weight.   No fevers or sweats.  No change in bowel habits.  No nausea or vomiting.  No bleeding.  Objective:  Vital signs in last 24 hours:  Blood pressure 122/64, pulse 79, temperature 98.3 F (36.8 C), temperature source Temporal, resp. rate 18, height 5\' 2"  (1.575 m), weight 113 lb (51.3 kg), SpO2 95 %.    HEENT: Neck without mass. Lymphatics: No palpable cervical, supraclavicular, axillary or inguinal lymph nodes. Resp: Distant breath sounds.  No respiratory distress. Cardio: Regular rate and rhythm. GI: Abdomen soft and nontender.  No hepatomegaly. Vascular: No leg edema. Neuro: Alert and oriented. Skin: Erythematous superficial abrasions in a linear distribution left back (appears consistent with scratch marks) Musculoskeletal: 3 cm smooth rounded cutaneous lesion overlying the left shoulder.   Lab Results:  Lab Results  Component Value Date   WBC 13.5 (H) 11/10/2016   HGB 14.2 11/10/2016   HCT 43.4 11/10/2016   MCV 90.6 11/10/2016   PLT 232 11/10/2016   NEUTROABS 6.8 (H) 11/10/2016    Imaging:  No results found.  Medications: I have reviewed the patient's current medications.  Assessment/Plan: 1. Bilateral invasive lobular breast cancer diagnosed in 04/1999, status post bilateral mastectomy with implants. She was treated with adjuvant AC chemotherapy, and began tamoxifen in 08/1999. She completed 5 years of tamoxifen, and began Femara in 07/2004. She completed 5 years of Femara in 07/2009.  2.Status post left shoulder surgery with a decreased range of motion at the left shoulder. 3.Chronic arthralgias. 4.Status post  right total knee replacement 5. Ongoing tobacco use-we referred her to the lung cancer screening program on 09/03/2015 6. History of diverticulitis  7. 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 Wedge resection of a mildly hypermetabolic right upper lobe nodule 08/25/2016-atypical lymphoid proliferation. B-cell clonality by PCR is positive. 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  Disposition: Lindsey Rose appears unchanged but continues to experience weight loss.  The lesion at the left shoulder appears larger.  We are referring her for CT scans chest/abdomen/pelvis and left shoulder.  We will see her back on 01/29/2019 to review the results.  She will contact the office in the interim with any problems.  Patient seen with Dr. Benay Spice.   Ned Card ANP/GNP-BC   01/21/2019  2:55 PM This was a shared visit with Ned Card.  Lindsey Rose was interviewed and examined.  The lesion at the left shoulder appears slightly larger and more firm.  She will be referred for CTs to evaluate the left shoulder lesion and look for evidence of a new primary cancer or metastatic disease.  Julieanne Manson, MD

## 2019-01-28 ENCOUNTER — Ambulatory Visit (INDEPENDENT_AMBULATORY_CARE_PROVIDER_SITE_OTHER): Payer: Medicare Other

## 2019-01-28 ENCOUNTER — Ambulatory Visit (HOSPITAL_COMMUNITY): Payer: Medicare Other

## 2019-01-28 ENCOUNTER — Other Ambulatory Visit: Payer: Self-pay

## 2019-01-28 DIAGNOSIS — M898X1 Other specified disorders of bone, shoulder: Secondary | ICD-10-CM

## 2019-01-28 DIAGNOSIS — Z853 Personal history of malignant neoplasm of breast: Secondary | ICD-10-CM

## 2019-01-28 DIAGNOSIS — R634 Abnormal weight loss: Secondary | ICD-10-CM | POA: Diagnosis not present

## 2019-01-29 ENCOUNTER — Telehealth: Payer: Self-pay | Admitting: Nurse Practitioner

## 2019-01-29 ENCOUNTER — Inpatient Hospital Stay: Payer: Medicare Other | Admitting: Nurse Practitioner

## 2019-01-29 ENCOUNTER — Telehealth: Payer: Self-pay

## 2019-01-29 NOTE — Telephone Encounter (Signed)
TC to pt per Ned Card NP to let her know that her  shoulder CT with no evidence of cancer, CTs of chest and abdomen negative except a small area in rt. Lung that could be same lymphoma she had before or benign inflammation, I let her know that they will review next visit and probably repeat chest CT 3 months. Patient verbalized understanding no further problems or concerns at this time.

## 2019-01-29 NOTE — Telephone Encounter (Signed)
Scheduled appt per 12/15 sch message - pt already aware ,

## 2019-02-05 ENCOUNTER — Telehealth: Payer: Self-pay | Admitting: Oncology

## 2019-02-05 NOTE — Telephone Encounter (Signed)
Faxed office notes to [facility]

## 2019-02-11 ENCOUNTER — Ambulatory Visit: Payer: Medicare Other | Admitting: Nurse Practitioner

## 2019-02-13 ENCOUNTER — Other Ambulatory Visit: Payer: Self-pay

## 2019-02-13 ENCOUNTER — Telehealth: Payer: Self-pay | Admitting: Oncology

## 2019-02-13 ENCOUNTER — Inpatient Hospital Stay (HOSPITAL_BASED_OUTPATIENT_CLINIC_OR_DEPARTMENT_OTHER): Payer: Medicare Other | Admitting: Nurse Practitioner

## 2019-02-13 ENCOUNTER — Encounter: Payer: Self-pay | Admitting: Nurse Practitioner

## 2019-02-13 VITALS — BP 116/64 | HR 77 | Temp 97.8°F | Resp 18 | Ht 62.0 in | Wt 111.7 lb

## 2019-02-13 DIAGNOSIS — R634 Abnormal weight loss: Secondary | ICD-10-CM | POA: Diagnosis not present

## 2019-02-13 DIAGNOSIS — M898X1 Other specified disorders of bone, shoulder: Secondary | ICD-10-CM

## 2019-02-13 DIAGNOSIS — Z853 Personal history of malignant neoplasm of breast: Secondary | ICD-10-CM | POA: Diagnosis not present

## 2019-02-13 NOTE — Telephone Encounter (Signed)
Scheduled per los. Gave avs and calendar  

## 2019-02-13 NOTE — Progress Notes (Addendum)
[facility] OFFICE PROGRESS NOTE   Diagnosis: Breast cancer  INTERVAL HISTORY:   Lindsey Rose returns as scheduled.  Appetite continues to be poor.  No fever.  No cough.  No shortness of breath.  She denies sweats.  She continues to have pain at the left scapula.  No nausea or vomiting.  No dysphagia.  She continues to smoke.  Objective:  Vital signs in last 24 hours:  Blood pressure 116/64, pulse 77, temperature 97.8 F (36.6 C), temperature source Temporal, resp. rate 18, height 5\' 2"  (1.575 m), weight 111 lb 11.2 oz (50.7 kg), SpO2 97 %.    HEENT: Neck without mass. Lymphatics: No palpable cervical, supraclavicular or axillary lymph nodes. GI: Abdomen soft and nontender.  No hepatosplenomegaly. Vascular: No leg edema. Musculoskeletal: 3 cm smooth rounded cutaneous lesion overlying the left shoulder.  Lab Results:  Lab Results  Component Value Date   WBC 13.5 (H) 11/10/2016   HGB 14.2 11/10/2016   HCT 43.4 11/10/2016   MCV 90.6 11/10/2016   PLT 232 11/10/2016   NEUTROABS 6.8 (H) 11/10/2016    Imaging:  No results found.  Medications: I have reviewed the patient's current medications.  Assessment/Plan: 1. Bilateral invasive lobular breast cancer diagnosed in 04/1999, status post bilateral mastectomy with implants. She was treated with adjuvant AC chemotherapy, and began tamoxifen in 08/1999. She completed 5 years of tamoxifen, and began Femara in 07/2004. She completed 5 years of Femara in 07/2009.  2.Status post left shoulder surgery with a decreased range of motion at the left shoulder. 3.Chronic arthralgias. 4.Status post right total knee replacement 5. Ongoing tobacco use-we referred her to the lung cancer screening program on 09/03/2015 6. History of diverticulitis  7. Family history of breast cancer and gastric cancer, she is confirmed to have a CDH1 mutation  Negative upper endoscopy November 2018  Negative upper endoscopy  02/16/2018 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 Wedge resection of a mildly hypermetabolic right upper lobe nodule 08/25/2016-atypical lymphoid proliferation. B-cell clonality by PCR is positive. 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 10.  01/28/2019 CT chest/abdomen/pelvis-new poorly defined 2.6 x 2.1 cm lesion right lung apex 11.  01/28/2019 CT left shoulder-no evidence of metastatic disease.  No evidence of loosening of the left shoulder arthroplasty.  Old nonunion fracture at the base of the acromium with secondary degenerative changes at the pseudoarticulation.  Scapula otherwise normal.  Disposition: Lindsey Rose appears unchanged.  Recent CT scans showed a new right lung lesion, appearance similar to that of the MALT lymphoma in the right upper lobe on the study from 07/13/2016.  Dr. Benay Spice recommends a repeat noncontrast chest CT at a 69-month interval.  Lindsey Rose agrees with this plan.  She continues to have pain at the left scapula.  The cutaneous lesion overlying the left shoulder is stable on exam today.  We are referring her to orthopedics.  Weight is fairly stable as compared to 3 weeks ago.  She will weigh herself weekly at home and contact the office with continued weight loss.  She will return for follow-up in 6 weeks.  She will contact the office in the interim as outlined above or with any other problems.  Patient seen with Dr. Benay Spice.  CT images reviewed on the computer with Ms. Gering and her husband.  25 minutes were spent face-to-face at today's visit with the majority of that time involved in counseling/coordination of care.    Lattie Haw  Marcello Moores ANP/GNP-BC   02/13/2019  11:32 AM  This was shared visit with Ned Card.  Lindsey Rose was interviewed and examined.  We reviewed the CT images. The right lung density may reflect a new area of MALT.  I doubt this explains the weight loss. No other evidence of a  malignancy.   She will f/u with primary provider for the weight loss.  The left shoulder mass is likely a benign lesion, potentially related to shoulder surgery. She requested an orthopedic referral.  Julieanne Manson, MD

## 2019-02-15 HISTORY — PX: COLON SURGERY: SHX602

## 2019-02-18 ENCOUNTER — Telehealth: Payer: Self-pay | Admitting: *Deleted

## 2019-02-18 NOTE — Telephone Encounter (Signed)
Referral faxed to [facility] - release [id number]

## 2019-03-28 ENCOUNTER — Other Ambulatory Visit: Payer: Self-pay

## 2019-03-28 ENCOUNTER — Inpatient Hospital Stay: Payer: Medicare Other | Attending: Oncology | Admitting: Oncology

## 2019-03-28 VITALS — BP 101/62 | HR 81 | Temp 98.2°F | Resp 17 | Ht 62.0 in | Wt 111.8 lb

## 2019-03-28 DIAGNOSIS — Z853 Personal history of malignant neoplasm of breast: Secondary | ICD-10-CM | POA: Insufficient documentation

## 2019-03-28 DIAGNOSIS — C884 Extranodal marginal zone B-cell lymphoma of mucosa-associated lymphoid tissue [MALT-lymphoma]: Secondary | ICD-10-CM | POA: Insufficient documentation

## 2019-03-28 DIAGNOSIS — Z9221 Personal history of antineoplastic chemotherapy: Secondary | ICD-10-CM | POA: Diagnosis not present

## 2019-03-28 DIAGNOSIS — R911 Solitary pulmonary nodule: Secondary | ICD-10-CM | POA: Diagnosis not present

## 2019-03-28 DIAGNOSIS — Z96612 Presence of left artificial shoulder joint: Secondary | ICD-10-CM | POA: Insufficient documentation

## 2019-03-28 NOTE — Progress Notes (Signed)
[facility] OFFICE PROGRESS NOTE   Diagnosis: Breast cancer, non-Hodgkin's lymphoma  INTERVAL HISTORY:   Lindsey Rose returns for a scheduled visit.  She has not seen a new orthopedist to evaluate the left shoulder.  She reports a referral has been made since she was not able to be seen at [facility].  She has discomfort at the left anterior costal margin.  She continues to have intermittent diarrhea.  No dyspnea or cough.  Objective:  Vital signs in last 24 hours:  Blood pressure 101/62, pulse 81, temperature 98.2 F (36.8 C), temperature source Temporal, resp. rate 17, height 5\' 2"  (1.575 m), weight 111 lb 12.8 oz (50.7 kg), SpO2 97 %.    HEENT: Neck without mass Lymphatics: No cervical, supraclavicular, axillary, or inguinal nodes Breast: Status post bilateral mastectomy with implants in place.  No evidence for chest wall tumor recurrence. GI: No hepatosplenomegaly, no mass, nontender Vascular: No leg edema Musculoskeletal: No mass at the left costal margin.  Minimal tenderness. Skin: Mobile 1 cm cutaneous nodule at the left anterolateral chest medial to a brown mole and lateral to the breast implant   Lab Results:  Lab Results  Component Value Date   WBC 13.5 (H) 11/10/2016   HGB 14.2 11/10/2016   HCT 43.4 11/10/2016   MCV 90.6 11/10/2016   PLT 232 11/10/2016   NEUTROABS 6.8 (H) 11/10/2016    CMP  Lab Results  Component Value Date   NA 137 08/28/2016   K 3.8 08/28/2016   CL 107 08/28/2016   CO2 26 08/28/2016   GLUCOSE 101 (H) 08/28/2016   BUN 14 08/28/2016   CREATININE 0.68 08/28/2016   CALCIUM 8.5 (L) 08/28/2016   PROT 5.3 (L) 08/27/2016   ALBUMIN 2.5 (L) 08/27/2016   AST 19 08/27/2016   ALT 13 (L) 08/27/2016   ALKPHOS 67 08/27/2016   BILITOT 0.2 (L) 08/27/2016   GFRNONAA >60 08/28/2016   GFRAA >60 08/28/2016     Medications: I have reviewed the patient's current medications.   Assessment/Plan: 1.Bilateral invasive lobular  breast cancer diagnosed in 04/1999, status post bilateral mastectomy with implants. She was treated with adjuvant AC chemotherapy, and began tamoxifen in 08/1999. She completed 5 years of tamoxifen, and began Femara in 07/2004. She completed 5 years of Femara in 07/2009.  2.Status post left shoulder surgery with a decreased range of motion at the left shoulder. 3.Chronic arthralgias. 4.Status post right total knee replacement 5. Ongoing tobacco use-we referred her to the lung cancer screening program on 09/03/2015 6. History of diverticulitis  7. 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 Wedge resection of a mildly hypermetabolic right upper lobe nodule 08/25/2016-atypical lymphoid proliferation. B-cell clonality by PCR is positive. 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 10.  01/28/2019 CT chest/abdomen/pelvis-new poorly defined 2.6 x 2.1 cm lesion right lung apex 11.  01/28/2019 CT left shoulder-no evidence of metastatic disease.  No evidence of loosening of the left shoulder arthroplasty.  Old nonunion fracture at the base of the acromium with secondary degenerative changes at the pseudoarticulation.  Scapula otherwise normal.    Disposition: Lindsey Rose appears unchanged.  Her weight is stable.  We reviewed the December 2020 shoulder CT images with the radiologist.  The smooth palpable lesion overlying the left shoulder is likely a  benign postoperative finding.  She plans to follow-up with orthopedics.  She appears asymptomatic from the right lung lesion.  She will be scheduled for a repeat chest CT and office visit in approximately 1 month.  I encouraged her to obtain the COVID-19 vaccine.  Betsy Coder, MD  03/28/2019  12:05 PM

## 2019-04-02 ENCOUNTER — Telehealth: Payer: Self-pay | Admitting: Oncology

## 2019-04-02 NOTE — Telephone Encounter (Signed)
Scheduled per los. Called, no voicemail. Mailed printout  

## 2019-04-22 ENCOUNTER — Ambulatory Visit (HOSPITAL_COMMUNITY)
Admission: RE | Admit: 2019-04-22 | Discharge: 2019-04-22 | Disposition: A | Discharge status: Home / Self Care | Payer: Medicare Other | Source: Ambulatory Visit | Attending: Nurse Practitioner | Admitting: Nurse Practitioner

## 2019-04-22 ENCOUNTER — Other Ambulatory Visit: Payer: Self-pay

## 2019-04-22 DIAGNOSIS — R634 Abnormal weight loss: Secondary | ICD-10-CM | POA: Diagnosis present

## 2019-04-22 DIAGNOSIS — Z853 Personal history of malignant neoplasm of breast: Secondary | ICD-10-CM | POA: Insufficient documentation

## 2019-04-23 ENCOUNTER — Telehealth: Payer: Self-pay | Admitting: Oncology

## 2019-04-23 ENCOUNTER — Inpatient Hospital Stay: Payer: Medicare Other | Attending: Oncology | Admitting: Nurse Practitioner

## 2019-04-23 VITALS — BP 115/70 | HR 88 | Temp 98.0°F | Resp 20 | Ht 62.0 in | Wt 111.5 lb

## 2019-04-23 DIAGNOSIS — Z803 Family history of malignant neoplasm of breast: Secondary | ICD-10-CM | POA: Insufficient documentation

## 2019-04-23 DIAGNOSIS — Z853 Personal history of malignant neoplasm of breast: Secondary | ICD-10-CM

## 2019-04-23 DIAGNOSIS — Z800 Family history of malignant neoplasm of digestive organs: Secondary | ICD-10-CM | POA: Insufficient documentation

## 2019-04-23 DIAGNOSIS — C884 Extranodal marginal zone B-cell lymphoma of mucosa-associated lymphoid tissue [MALT-lymphoma]: Secondary | ICD-10-CM

## 2019-04-23 DIAGNOSIS — Z9013 Acquired absence of bilateral breasts and nipples: Secondary | ICD-10-CM | POA: Diagnosis not present

## 2019-04-23 DIAGNOSIS — Z96651 Presence of right artificial knee joint: Secondary | ICD-10-CM | POA: Insufficient documentation

## 2019-04-23 NOTE — Progress Notes (Addendum)
[facility] OFFICE PROGRESS NOTE   Diagnosis: Breast cancer, non-Hodgkin's lymphoma  INTERVAL HISTORY:   Lindsey Rose returns as scheduled.  She feels well overall.  She reports her appetite is better.  No change at the left shoulder.  She anticipates an appointment with orthopedics in the near future.  No fevers or sweats.  No shortness of breath or cough.  Bowels moving regularly.  Objective:  Vital signs in last 24 hours:  Blood pressure 115/70, pulse 88, temperature 98 F (36.7 C), temperature source Temporal, resp. rate 20, height 5\' 2"  (1.575 m), weight 111 lb 8 oz (50.6 kg), SpO2 96 %.    HEENT: No thrush or ulcers. Lymphatics: No palpable cervical, supraclavicular, axillary or inguinal lymph nodes. Resp: Lungs clear bilaterally. Cardio: Regular rate and rhythm. GI: Abdomen soft and nontender.  No hepatosplenomegaly. Vascular: No leg edema. Skin: Approximate 1 cm mobile cutaneous nodule left anterolateral chest wall.   Lab Results:  Lab Results  Component Value Date   WBC 13.5 (H) 11/10/2016   HGB 14.2 11/10/2016   HCT 43.4 11/10/2016   MCV 90.6 11/10/2016   PLT 232 11/10/2016   NEUTROABS 6.8 (H) 11/10/2016    Imaging:  CT Chest Wo Contrast  Result Date: 04/22/2019 CLINICAL DATA:  Malt lymphoma. Right apical lung lesion on prior imaging. EXAM: CT CHEST WITHOUT CONTRAST TECHNIQUE: Multidetector CT imaging of the chest was performed following the standard protocol without IV contrast. COMPARISON:  01/28/2019 FINDINGS: Cardiovascular: The heart size is normal. No substantial pericardial effusion. Coronary artery calcification is evident. Atherosclerotic calcification is noted in the wall of the thoracic aorta. Mediastinum/Nodes: No mediastinal lymphadenopathy. No evidence for gross hilar lymphadenopathy although assessment is limited by the lack of intravenous contrast on today's study. The esophagus has normal imaging features. There is no axillary  lymphadenopathy. Lungs/Pleura: Centrilobular emphsyema noted. Mixed attenuation lesion in the right lung apex (22/7) is stable at 2.1 cm. Stable appearance of right lung surgical changes. No new suspicious pulmonary nodule or mass. No focal airspace consolidation. No pleural effusion. Upper Abdomen: Unremarkable. Musculoskeletal: No worrisome lytic or sclerotic osseous abnormality. Status post left shoulder replacement. IMPRESSION: Stable 2.1 cm mixed attenuation lesion right lung apex. Continued surveillance recommended as neoplasm not excluded. Emphysema (ICD10-J43.9) and Aortic Atherosclerosis (ICD10-170.0) Electronically Signed   By: Misty Stanley M.D.   On: 04/22/2019 11:06    Medications: I have reviewed the patient's current medications.  Assessment/Plan: 1.Bilateral invasive lobular breast cancer diagnosed in 04/1999, status post bilateral mastectomy with implants. She was treated with adjuvant AC chemotherapy, and began tamoxifen in 08/1999. She completed 5 years of tamoxifen, and began Femara in 07/2004. She completed 5 years of Femara in 07/2009.  2.Status post left shoulder surgery with a decreased range of motion at the left shoulder. 3.Chronic arthralgias. 4.Status post right total knee replacement 5. Ongoing tobacco use-we referred her to the lung cancer screening program on 09/03/2015 6. History of diverticulitis  7. 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 Wedge resection of a mildly hypermetabolic right upper lobe nodule 08/25/2016-atypical lymphoid proliferation. B-cell clonality by PCR is positive. 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 10.01/28/2019 CT chest/abdomen/pelvis-new poorly defined 2.6 x 2.1 cm lesion right lung  apex  CT chest 04/22/2019-stable 2.1 cm mixed attenuation lesion right lung  apex. 11.01/28/2019 CT left shoulder-no evidence of metastatic disease. No evidence of loosening of the left shoulder arthroplasty. Old nonunion fracture at the base of the acromium with secondary degenerative changes at the pseudoarticulation. Scapula otherwise normal.  Disposition: Lindsey Rose appears stable.  We reviewed the chest CT from yesterday.  The lesion at the right lung apex is stable.  We discussed the possibility this is related to the MALT lymphoma.  We discussed a referral to radiation oncology.  She would like to continue observation for now.  Plan for a repeat noncontrast chest CT at a 74-month interval.  She will return for a follow-up visit in 4 months, a few days after the chest CT.  She will contact the office in the interim with any problems.  Patient seen with Dr. Benay Spice.  CT images reviewed on the computer with Ms. Henehan and her husband.    Lindsey Rose ANP/GNP-BC   04/23/2019  10:29 AM This was a shared visit with Lindsey Rose.  We reviewed the CT images with Lindsey Rose and her husband.  The right upper lung lesion is unchanged.  She prefers observation as opposed to a radiation oncology referral.  She will return for an office visit and repeat chest CT in 4 months.  She will call for new symptoms.  Julieanne Manson, MD

## 2019-04-23 NOTE — Telephone Encounter (Signed)
Scheduled per los. Gave avs and calendar  

## 2019-05-28 ENCOUNTER — Telehealth: Payer: Self-pay | Admitting: *Deleted

## 2019-05-28 NOTE — Telephone Encounter (Signed)
Her PCP wants to start her on a med to strengthen her bones. Would be an every 6 month injection. Asking if this is Ok w/Dr. Benay Spice? Per Dr. Benay Spice: OK to take bisphosphonate.

## 2019-08-21 ENCOUNTER — Ambulatory Visit (HOSPITAL_COMMUNITY)
Admission: RE | Admit: 2019-08-21 | Discharge: 2019-08-21 | Disposition: A | Discharge status: Home / Self Care | Payer: Medicare Other | Source: Ambulatory Visit | Attending: Nurse Practitioner | Admitting: Nurse Practitioner

## 2019-08-21 ENCOUNTER — Other Ambulatory Visit: Payer: Self-pay

## 2019-08-21 DIAGNOSIS — Z853 Personal history of malignant neoplasm of breast: Secondary | ICD-10-CM | POA: Insufficient documentation

## 2019-08-21 DIAGNOSIS — C884 Extranodal marginal zone B-cell lymphoma of mucosa-associated lymphoid tissue [MALT-lymphoma]: Secondary | ICD-10-CM | POA: Insufficient documentation

## 2019-08-22 ENCOUNTER — Inpatient Hospital Stay: Payer: Medicare Other | Attending: Oncology | Admitting: Oncology

## 2019-08-22 ENCOUNTER — Other Ambulatory Visit: Payer: Self-pay

## 2019-08-22 VITALS — BP 104/74 | HR 74 | Temp 97.7°F | Resp 16 | Ht 62.0 in | Wt 109.2 lb

## 2019-08-22 DIAGNOSIS — C884 Extranodal marginal zone b-cell lymphoma of mucosa-associated lymphoid tissue (malt-lymphoma) not having achieved remission: Secondary | ICD-10-CM

## 2019-08-22 DIAGNOSIS — Z853 Personal history of malignant neoplasm of breast: Secondary | ICD-10-CM | POA: Diagnosis present

## 2019-08-22 DIAGNOSIS — Z79810 Long term (current) use of selective estrogen receptor modulators (SERMs): Secondary | ICD-10-CM | POA: Insufficient documentation

## 2019-08-22 DIAGNOSIS — Z9013 Acquired absence of bilateral breasts and nipples: Secondary | ICD-10-CM | POA: Insufficient documentation

## 2019-08-22 DIAGNOSIS — Z9221 Personal history of antineoplastic chemotherapy: Secondary | ICD-10-CM | POA: Insufficient documentation

## 2019-08-22 NOTE — Progress Notes (Signed)
[facility] OFFICE PROGRESS NOTE   Diagnosis: Breast cancer, non-Hodgkin's lymphoma  INTERVAL HISTORY:   Lindsey Rose returns as scheduled.  No dyspnea or cough.  Good appetite.  She reports the orthopedic surgeon drain the cystic lesion at the left shoulder and it has recurred.  She continues to have discomfort at the left anterior costal margin.  Objective:  Vital signs in last 24 hours:  Blood pressure 104/74, pulse 74, temperature 97.7 F (36.5 C), temperature source Temporal, resp. rate 16, height 5\' 2"  (1.575 m), weight 109 lb 3.2 oz (49.5 kg), SpO2 98 %.    HEENT: Neck without mass Lymphatics: No cervical, supraclavicular, or inguinal nodes.  "Shotty "right greater than left axillary nodes Resp: Lungs with end inspiratory rales at the right posterior chest, no respiratory distress Cardio: Regular rate and rhythm GI: No hepatosplenomegaly, no mass, nontender Vascular: No leg edema Musculoskeletal: Soft round 3-4 cm lesion at the left shoulder Breast: Status post bilateral mastectomy with implants in place.  No evidence for chest wall tumor recurrence.   Lab Results:  Lab Results  Component Value Date   WBC 13.5 (H) 11/10/2016   HGB 14.2 11/10/2016   HCT 43.4 11/10/2016   MCV 90.6 11/10/2016   PLT 232 11/10/2016   NEUTROABS 6.8 (H) 11/10/2016    CMP  Lab Results  Component Value Date   NA 137 08/28/2016   K 3.8 08/28/2016   CL 107 08/28/2016   CO2 26 08/28/2016   GLUCOSE 101 (H) 08/28/2016   BUN 14 08/28/2016   CREATININE 0.68 08/28/2016   CALCIUM 8.5 (L) 08/28/2016   PROT 5.3 (L) 08/27/2016   ALBUMIN 2.5 (L) 08/27/2016   AST 19 08/27/2016   ALT 13 (L) 08/27/2016   ALKPHOS 67 08/27/2016   BILITOT 0.2 (L) 08/27/2016   GFRNONAA >60 08/28/2016   GFRAA >60 08/28/2016      Imaging: CT chest images from 08/21/2019 reviewed with Lindsey Rose Medications: I have reviewed the patient's current medications.   Assessment/Plan: 1.Bilateral  invasive lobular breast cancer diagnosed in 04/1999, status post bilateral mastectomy with implants. She was treated with adjuvant AC chemotherapy, and began tamoxifen in 08/1999. She completed 5 years of tamoxifen, and began Femara in 07/2004. She completed 5 years of Femara in 07/2009.  2.Status post left shoulder surgery with a decreased range of motion at the left shoulder. 3.Chronic arthralgias. 4.Status post right total knee replacement 5. Ongoing tobacco use-we referred her to the lung cancer screening program on 09/03/2015 6. History of diverticulitis  7. 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 Wedge resection of a mildly hypermetabolic right upper lobe nodule 08/25/2016-atypical lymphoid proliferation. B-cell clonality by PCR is positive. 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 10.01/28/2019 CT chest/abdomen/pelvis-new poorly defined 2.6 x 2.1 cm lesion right lung apex  CT chest 04/22/2019-stable 2.1 cm mixed attenuation lesion right lung apex. 11.01/28/2019 CT left shoulder-no evidence of metastatic disease. No evidence of loosening of the left shoulder arthroplasty. Old nonunion fracture at the base of the acromium with secondary degenerative changes at the pseudoarticulation. Scapula otherwise normal.    Disposition: Lindsey Rose is  in clinical remission from breast cancer.  A recent upper endoscopy and colonoscopy revealed no evidence of malignancy.  I reviewed the chest CT from yesterday.  The right lung lesion appears  unchanged.  We will follow up on the final report.  Lindsey Rose has lost 2-3 pounds over the past 6 months by our scale.  She reports a good appetite.  I have a low suspicion for a progressive malignancy.  She will return for an office visit and repeat chest CT in December.  Betsy Coder, MD  08/22/2019  10:38 AM

## 2019-08-23 ENCOUNTER — Telehealth: Payer: Self-pay

## 2019-08-23 NOTE — Telephone Encounter (Signed)
TC to Lindsey Rose per Dr Benay Spice to let her know her final CT report reveals slight enlargement of the right lung nodule that we reviewed today, there is an additional small inflammatory lesion in the right lung, let her know that Dr Benay Spice will continue to observe these areas, follow-up as scheduled. Lindsey Rose verbalized understanding.

## 2019-08-23 NOTE — Telephone Encounter (Signed)
Left voicemail for patient to call back [facility] 

## 2019-08-26 ENCOUNTER — Telehealth: Payer: Self-pay | Admitting: Oncology

## 2019-08-26 NOTE — Telephone Encounter (Signed)
Scheduled appts per 7/8 los. Left voicemail with appt date and time.

## 2020-02-10 ENCOUNTER — Other Ambulatory Visit: Payer: Self-pay

## 2020-02-10 ENCOUNTER — Ambulatory Visit (HOSPITAL_COMMUNITY)
Admission: RE | Admit: 2020-02-10 | Discharge: 2020-02-10 | Disposition: A | Discharge status: Home / Self Care | Payer: Medicare Other | Source: Ambulatory Visit | Attending: Oncology | Admitting: Oncology

## 2020-02-10 ENCOUNTER — Encounter (HOSPITAL_COMMUNITY): Payer: Self-pay

## 2020-02-10 DIAGNOSIS — C884 Extranodal marginal zone B-cell lymphoma of mucosa-associated lymphoid tissue [MALT-lymphoma]: Secondary | ICD-10-CM | POA: Diagnosis present

## 2020-02-11 ENCOUNTER — Inpatient Hospital Stay: Payer: Medicare Other | Attending: Oncology | Admitting: Oncology

## 2020-02-11 ENCOUNTER — Other Ambulatory Visit: Payer: Self-pay

## 2020-02-11 VITALS — BP 99/64 | HR 98 | Temp 98.1°F | Resp 16 | Ht 62.0 in | Wt 108.8 lb

## 2020-02-11 DIAGNOSIS — Z9013 Acquired absence of bilateral breasts and nipples: Secondary | ICD-10-CM | POA: Insufficient documentation

## 2020-02-11 DIAGNOSIS — F17210 Nicotine dependence, cigarettes, uncomplicated: Secondary | ICD-10-CM | POA: Diagnosis not present

## 2020-02-11 DIAGNOSIS — Z9221 Personal history of antineoplastic chemotherapy: Secondary | ICD-10-CM | POA: Insufficient documentation

## 2020-02-11 DIAGNOSIS — C884 Extranodal marginal zone B-cell lymphoma of mucosa-associated lymphoid tissue [MALT-lymphoma]: Secondary | ICD-10-CM

## 2020-02-11 DIAGNOSIS — Z8579 Personal history of other malignant neoplasms of lymphoid, hematopoietic and related tissues: Secondary | ICD-10-CM | POA: Insufficient documentation

## 2020-02-11 DIAGNOSIS — M2550 Pain in unspecified joint: Secondary | ICD-10-CM | POA: Insufficient documentation

## 2020-02-11 DIAGNOSIS — Z853 Personal history of malignant neoplasm of breast: Secondary | ICD-10-CM | POA: Insufficient documentation

## 2020-02-11 NOTE — Progress Notes (Signed)
[place] OFFICE PROGRESS NOTE   Diagnosis: MALT  INTERVAL HISTORY:   Lindsey Rose returns for a scheduled visit.  She feels well.  Good appetite.  She no longer has diarrhea.  No complaint.  Objective:  Vital signs in last 24 hours:  Blood pressure 99/64, pulse 98, temperature 98.1 F (36.7 C), temperature source Tympanic, resp. rate 16, height 5\' 2"  (1.575 m), weight 108 lb 12.8 oz (49.4 kg), SpO2 97 %.    Lymphatics: No cervical, supraclavicular, or inguinal nodes.  "Shotty "left axillary node Resp: End inspiratory rales at the posterior base bilaterally, no respiratory distress Cardio: Regular rate and rhythm GI: No hepatosplenomegaly, no mass, nontender Vascular: No leg edema Breast: Status post bilateral mastectomy with implants in place, no evidence for chest wall tumor recurrence Musculoskeletal: Soft oval lesion at the left upper humerus/shoulder joint   Lab Results:  Lab Results  Component Value Date   WBC 13.5 (H) 11/10/2016   HGB 14.2 11/10/2016   HCT 43.4 11/10/2016   MCV 90.6 11/10/2016   PLT 232 11/10/2016   NEUTROABS 6.8 (H) 11/10/2016    CMP  Lab Results  Component Value Date   NA 137 08/28/2016   K 3.8 08/28/2016   CL 107 08/28/2016   CO2 26 08/28/2016   GLUCOSE 101 (H) 08/28/2016   BUN 14 08/28/2016   CREATININE 0.68 08/28/2016   CALCIUM 8.5 (L) 08/28/2016   PROT 5.3 (L) 08/27/2016   ALBUMIN 2.5 (L) 08/27/2016   AST 19 08/27/2016   ALT 13 (L) 08/27/2016   ALKPHOS 67 08/27/2016   BILITOT 0.2 (L) 08/27/2016   GFRNONAA >60 08/28/2016   GFRAA >60 08/28/2016    No results found for: CEA1  Lab Results  Component Value Date   INR 0.96 08/23/2016    Imaging:  CT CHEST WO CONTRAST  Result Date: 02/10/2020 CLINICAL DATA:  MALT lymphoma, follow-up right lung lesion EXAM: CT CHEST WITHOUT CONTRAST TECHNIQUE: Multidetector CT imaging of the chest was performed following the standard protocol without IV contrast. COMPARISON:   08/21/2019 FINDINGS: Cardiovascular: Heart is normal in size.  No pericardial effusion. No evidence of thoracic aortic aneurysm. Atherosclerotic calcifications of the aortic arch. Mild coronary atherosclerosis the LAD and left circumflex. Mediastinum/Nodes: No suspicious mediastinal or axillary lymphadenopathy. Hilar regions are suboptimally evaluated in the absence intravenous contrast administration. Visualized thyroid is unremarkable. Lungs/Pleura: Prior ground-glass opacity in the right lung apex has resolved. Prior subpleural ground-glass opacity in the anterior right middle lobe has resolved. 3 mm triangular subpleural nodule in the posterior left lower lobe (series 7/image 99), unchanged, benign. No new/suspicious pulmonary nodules. Status post right upper lobe wedge resection. Mild paraseptal emphysematous changes, upper lung predominant. No focal consolidation. Mild dependent atelectasis in the bilateral lower lobes. No pleural effusion or pneumothorax. Upper Abdomen: Visualized upper abdomen is grossly unremarkable, noting vascular calcifications. Musculoskeletal: Status post bilateral breast augmentation. Status post left shoulder arthroplasty. No focal osseous lesions. IMPRESSION: Status post right upper lobe wedge resection. Prior ground-glass opacities in the right upper and middle lobes have resolved. No evidence of recurrent or metastatic disease. Aortic Atherosclerosis (ICD10-I70.0) and Emphysema (ICD10-J43.9). Electronically Signed   By: Julian Hy M.D.   On: 02/10/2020 16:14    Medications: I have reviewed the patient's current medications.   Assessment/Plan: 1.Bilateral invasive lobular breast cancer diagnosed in 04/1999, status post bilateral mastectomy with implants. She was treated with adjuvant AC chemotherapy, and began tamoxifen in 08/1999. She completed 5 years of tamoxifen,  and began Femara in 07/2004. She completed 5 years of Femara in 07/2009.  2.Status post left  shoulder surgery with a decreased range of motion at the left shoulder. 3.Chronic arthralgias. 4.Status post right total knee replacement 5. Ongoing tobacco use-we referred her to the lung cancer screening program on 09/03/2015 6. History of diverticulitis  7. 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 Wedge resection of a mildly hypermetabolic right upper lobe nodule 08/25/2016-atypical lymphoid proliferation. B-cell clonality by PCR is positive. 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  01/28/2019 CT chest/abdomen/pelvis-new poorly defined 2.6 x 2.1 cm lesion right lung apex  CT chest 04/22/2019-stable 2.1 cm mixed attenuation lesion right lung apex.  CT chest 08/21/2019-interval growth of a subsolid right apical nodule, new groundglass attenuation nodule in the subpleural right middle lobe  CT chest 02/10/2020-previous groundglass opacities in the right upper lobe and middle lobes have resolved, no evidence of recurrent or metastatic disease, stable 3 mm triangular subpleural posterior left lower lobe nodule, no new nodules 11.01/28/2019 CT left shoulder-no evidence of metastatic disease. No evidence of loosening of the left shoulder arthroplasty. Old nonunion fracture at the base of the acromium with secondary degenerative changes at the pseudoarticulation. Scapula otherwise normal.      Disposition: Lindsey Rose is in clinical remission from breast cancer and the pulmonary MALT.  The new lung nodules from July of this year have  resolved.  There is no clinical or radiologic evidence of disease progression.  She will return for an office visit and surveillance chest CT in 9 months.  She reports that she has scheduled follow-up with Dr. Matthias Hughs for GI surveillance in the setting of the CDH1 mutation.    Thornton Papas, MD  02/11/2020  12:51 PM

## 2020-02-12 ENCOUNTER — Telehealth: Payer: Self-pay | Admitting: Oncology

## 2020-02-12 NOTE — Telephone Encounter (Signed)
Scheduled appointments per 12/28 los. Mailed updated calendar to patient with appointments date and times.

## 2020-11-06 ENCOUNTER — Other Ambulatory Visit: Payer: Self-pay

## 2020-11-06 DIAGNOSIS — C884 Extranodal marginal zone B-cell lymphoma of mucosa-associated lymphoid tissue [MALT-lymphoma]: Secondary | ICD-10-CM

## 2020-11-06 NOTE — Progress Notes (Signed)
Labs entered per phlebotomy for 11/11/2020 visit

## 2020-11-11 ENCOUNTER — Inpatient Hospital Stay: Payer: Medicare Other | Admitting: Nurse Practitioner

## 2020-11-11 ENCOUNTER — Other Ambulatory Visit: Payer: Medicare Other

## 2020-11-11 ENCOUNTER — Inpatient Hospital Stay: Payer: Medicare Other

## 2020-11-18 ENCOUNTER — Ambulatory Visit (HOSPITAL_BASED_OUTPATIENT_CLINIC_OR_DEPARTMENT_OTHER)
Admission: RE | Admit: 2020-11-18 | Discharge: 2020-11-18 | Disposition: A | Discharge status: Home / Self Care | Payer: Medicare Other | Source: Ambulatory Visit | Attending: Oncology | Admitting: Oncology

## 2020-11-18 ENCOUNTER — Other Ambulatory Visit: Payer: Self-pay

## 2020-11-18 DIAGNOSIS — C884 Extranodal marginal zone b-cell lymphoma of mucosa-associated lymphoid tissue (malt-lymphoma) not having achieved remission: Secondary | ICD-10-CM

## 2020-11-19 ENCOUNTER — Encounter: Payer: Self-pay | Admitting: Nurse Practitioner

## 2020-11-19 ENCOUNTER — Inpatient Hospital Stay (HOSPITAL_BASED_OUTPATIENT_CLINIC_OR_DEPARTMENT_OTHER): Payer: Medicare Other | Admitting: Nurse Practitioner

## 2020-11-19 ENCOUNTER — Inpatient Hospital Stay: Payer: Medicare Other | Attending: Nurse Practitioner

## 2020-11-19 VITALS — BP 108/65 | HR 79 | Temp 97.1°F | Resp 16 | Wt 109.2 lb

## 2020-11-19 DIAGNOSIS — Z9221 Personal history of antineoplastic chemotherapy: Secondary | ICD-10-CM | POA: Insufficient documentation

## 2020-11-19 DIAGNOSIS — Z9013 Acquired absence of bilateral breasts and nipples: Secondary | ICD-10-CM | POA: Insufficient documentation

## 2020-11-19 DIAGNOSIS — C884 Extranodal marginal zone B-cell lymphoma of mucosa-associated lymphoid tissue [MALT-lymphoma]: Secondary | ICD-10-CM | POA: Diagnosis not present

## 2020-11-19 DIAGNOSIS — F17210 Nicotine dependence, cigarettes, uncomplicated: Secondary | ICD-10-CM | POA: Diagnosis not present

## 2020-11-19 DIAGNOSIS — Z800 Family history of malignant neoplasm of digestive organs: Secondary | ICD-10-CM | POA: Insufficient documentation

## 2020-11-19 DIAGNOSIS — M2550 Pain in unspecified joint: Secondary | ICD-10-CM | POA: Diagnosis not present

## 2020-11-19 DIAGNOSIS — Z8572 Personal history of non-Hodgkin lymphomas: Secondary | ICD-10-CM | POA: Insufficient documentation

## 2020-11-19 DIAGNOSIS — Z803 Family history of malignant neoplasm of breast: Secondary | ICD-10-CM | POA: Insufficient documentation

## 2020-11-19 DIAGNOSIS — Z853 Personal history of malignant neoplasm of breast: Secondary | ICD-10-CM | POA: Insufficient documentation

## 2020-11-19 LAB — BASIC METABOLIC PANEL - CANCER CENTER ONLY
Anion gap: 6 (ref 5–15)
BUN: 19 mg/dL (ref 8–23)
CO2: 29 mmol/L (ref 22–32)
Calcium: 9 mg/dL (ref 8.9–10.3)
Chloride: 102 mmol/L (ref 98–111)
Creatinine: 0.81 mg/dL (ref 0.44–1.00)
GFR, Estimated: >60 mL/min (ref >60)
Glucose, Bld: 133 mg/dL — ABNORMAL HIGH (ref 70–99)
Potassium: 3.8 mmol/L (ref 3.5–5.1)
Sodium: 137 mmol/L (ref 135–145)

## 2020-11-19 LAB — CBC WITH DIFFERENTIAL (CANCER CENTER ONLY)
Abs Immature Granulocytes: 0.02 10*3/uL (ref 0.00–0.07)
Basophils Absolute: 0.1 10*3/uL (ref 0.0–0.1)
Basophils Relative: 1 %
Eosinophils Absolute: 0.4 10*3/uL (ref 0.0–0.5)
Eosinophils Relative: 6 %
HCT: 38.1 % (ref 36.0–46.0)
Hemoglobin: 12.3 g/dL (ref 12.0–15.0)
Immature Granulocytes: 0 %
Lymphocytes Relative: 32 %
Lymphs Abs: 2.4 10*3/uL (ref 0.7–4.0)
MCH: 30.2 pg (ref 26.0–34.0)
MCHC: 32.3 g/dL (ref 30.0–36.0)
MCV: 93.6 fL (ref 80.0–100.0)
Monocytes Absolute: 0.7 10*3/uL (ref 0.1–1.0)
Monocytes Relative: 10 %
Neutro Abs: 3.8 10*3/uL (ref 1.7–7.7)
Neutrophils Relative %: 51 %
Platelet Count: 219 10*3/uL (ref 150–400)
RBC: 4.07 MIL/uL (ref 3.87–5.11)
RDW: 14.6 % (ref 11.5–15.5)
WBC Count: 7.4 10*3/uL (ref 4.0–10.5)
nRBC: 0 % (ref 0.0–0.2)

## 2020-11-19 LAB — MAGNESIUM: Magnesium: 1.8 mg/dL (ref 1.7–2.4)

## 2020-11-19 NOTE — Progress Notes (Signed)
[place] OFFICE PROGRESS NOTE   Diagnosis: Breast cancer, non-Hodgkin's lymphoma  INTERVAL HISTORY:   Ms. Dedic returns as scheduled.  She feels well.  She has a good appetite.  She reports her weight is stable.  She denies pain at present.  No fever, cough, shortness of breath.  Objective:  Vital signs in last 24 hours:  Blood pressure 108/65, pulse 79, temperature (!) 97.1 F (36.2 C), temperature source Oral, resp. rate 16, weight 109 lb 3.2 oz (49.5 kg), SpO2 96 %.    Lymphatics: No palpable cervical, supraclavicular, axillary or inguinal lymph nodes. Resp: A few scattered wheezes.  No respiratory distress. Cardio: Regular rate and rhythm. GI: Abdomen soft and nontender.  No hepatosplenomegaly.  No mass. Vascular: No leg edema. Breast: Status post bilateral mastectomy with implants in place.  No evidence for chest wall tumor recurrence. Musculoskeletal: Soft oval lesions at the left upper humerus/shoulder joint.   Lab Results:  Lab Results  Component Value Date   WBC 7.4 11/19/2020   HGB 12.3 11/19/2020   HCT 38.1 11/19/2020   MCV 93.6 11/19/2020   PLT 219 11/19/2020   NEUTROABS 3.8 11/19/2020    Imaging:  No results found.  Medications: I have reviewed the patient's current medications.  Assessment/Plan: 1.Bilateral invasive lobular breast cancer diagnosed in 04/1999, status post bilateral mastectomy with implants.  She was treated with adjuvant AC chemotherapy, and began tamoxifen in 08/1999.  She completed 5 years of tamoxifen, and began Femara in 07/2004.  She completed 5 years of Femara in 07/2009.   2.  Status post left shoulder surgery with a decreased range of motion at the left shoulder. 3.  Chronic arthralgias. 4.  Status post right total knee replacement 5.  Ongoing tobacco use-we referred her to the lung cancer screening program on 09/03/2015 6.  History of diverticulitis  7. Family history of breast cancer and gastric cancer, she  is confirmed to have a CDH1 mutation Negative upper endoscopy November 2018 Negative upper endoscopy 02/16/2018 Upper endoscopy 06/27/2019 mild inflammation in the stomach, no mass, biopsies revealed chronic inactive gastritis Colonoscopy 06/27/2019 patent colocolonic anastomosis, biopsies to rule out colitis were negative Upper endoscopy 09/16/2020-normal larynx and esophagus; 2 cm hiatal hernia.  Normal stomach, biopsied, normal examined duodenum.  Stomach biopsy with chronic inactive gastritis. 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 Wedge resection of a mildly hypermetabolic right upper lobe nodule 08/25/2016-atypical lymphoid proliferation. B-cell clonality by PCR is positive. 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  01/28/2019 CT chest/abdomen/pelvis-new poorly defined 2.6 x 2.1 cm lesion right lung apex CT chest 04/22/2019-stable 2.1 cm mixed attenuation lesion right lung apex. CT chest 08/21/2019-interval growth of a subsolid right apical nodule, new groundglass attenuation nodule in the subpleural right middle lobe CT chest 02/10/2020-previous groundglass opacities in the right upper lobe and middle lobes have resolved, no evidence of recurrent or metastatic disease, stable 3 mm triangular subpleural posterior left lower lobe nodule, no new nodules 11.  01/28/2019 CT left shoulder-no evidence of metastatic disease.  No evidence of loosening of the left shoulder arthroplasty.  Old nonunion fracture at the base of the acromium with secondary degenerative changes at the pseudoarticulation.  Scapula otherwise normal.    Disposition: Ms. Hashimi remains in clinical remission from breast cancer and the pulmonary MALT.  She had a chest CT yesterday.  Final report has not been issued.  We will contact her once this is available.  Plan  at present is for a surveillance chest CT and follow-up visit in 9 months.  We will adjust accordingly  pending CT from yesterday.  She continues follow-up with Dr. Cristina Gong for GI surveillance in the setting of the CDH1 mutation.  Patient seen with Dr. Benay Spice.  Ned Card ANP/GNP-BC   11/19/2020  11:58 AM This was a shared visit with Ned Card.  We will follow-up on the CT from yesterday.  Ms. Hribar will continue follow-up with the cancer center.  I was present.  50% of today's visit.  I performed medical decision making.  Julieanne Manson, MD

## 2020-11-23 ENCOUNTER — Telehealth: Payer: Self-pay

## 2020-11-23 NOTE — Telephone Encounter (Signed)
Pt called results given. Pt verbalized understanding.

## 2020-11-23 NOTE — Telephone Encounter (Signed)
-----   Message from Ladell Pier, MD sent at 11/20/2020  3:59 PM EDT ----- Please call patient, CT is negative for cancer, follow-up as scheduled

## 2021-06-02 ENCOUNTER — Telehealth: Payer: Self-pay

## 2021-06-02 NOTE — Telephone Encounter (Signed)
Patient called in stated  she have knots on her hands and ft.  and she want to come in. She is schedule to come on 5/2 at 915 with Lisa. Patient have verbal understanding ?

## 2021-06-15 ENCOUNTER — Inpatient Hospital Stay: Payer: Medicare Other | Admitting: Nurse Practitioner

## 2021-07-01 ENCOUNTER — Inpatient Hospital Stay: Payer: Medicare Other | Attending: Nurse Practitioner | Admitting: Nurse Practitioner

## 2021-08-12 ENCOUNTER — Ambulatory Visit (HOSPITAL_BASED_OUTPATIENT_CLINIC_OR_DEPARTMENT_OTHER)
Admission: RE | Admit: 2021-08-12 | Discharge: 2021-08-12 | Disposition: A | Discharge status: Home / Self Care | Payer: Medicare Other | Source: Ambulatory Visit | Attending: Nurse Practitioner | Admitting: Nurse Practitioner

## 2021-08-12 DIAGNOSIS — C884 Extranodal marginal zone B-cell lymphoma of mucosa-associated lymphoid tissue [MALT-lymphoma]: Secondary | ICD-10-CM | POA: Diagnosis present

## 2021-08-12 DIAGNOSIS — Z853 Personal history of malignant neoplasm of breast: Secondary | ICD-10-CM | POA: Diagnosis present

## 2021-08-27 ENCOUNTER — Inpatient Hospital Stay: Payer: Medicare PPO | Attending: Oncology | Admitting: Oncology

## 2021-08-27 ENCOUNTER — Encounter: Payer: Self-pay | Admitting: *Deleted

## 2021-08-27 NOTE — Progress Notes (Signed)
Lindsey Rose was "no show" for OV today. Scheduling message sent to reschedule for 1 month.

## 2021-09-20 ENCOUNTER — Other Ambulatory Visit: Payer: Self-pay | Admitting: *Deleted

## 2021-09-20 ENCOUNTER — Inpatient Hospital Stay: Payer: Medicare PPO | Attending: Oncology | Admitting: Oncology

## 2021-09-20 VITALS — BP 107/75 | HR 80 | Temp 98.1°F | Resp 18 | Ht 62.0 in | Wt 103.8 lb

## 2021-09-20 DIAGNOSIS — C884 Extranodal marginal zone b-cell lymphoma of mucosa-associated lymphoid tissue (malt-lymphoma) not having achieved remission: Secondary | ICD-10-CM

## 2021-09-20 DIAGNOSIS — J449 Chronic obstructive pulmonary disease, unspecified: Secondary | ICD-10-CM | POA: Diagnosis not present

## 2021-09-20 DIAGNOSIS — Z8572 Personal history of non-Hodgkin lymphomas: Secondary | ICD-10-CM | POA: Diagnosis not present

## 2021-09-20 DIAGNOSIS — F17200 Nicotine dependence, unspecified, uncomplicated: Secondary | ICD-10-CM | POA: Diagnosis not present

## 2021-09-20 DIAGNOSIS — Z803 Family history of malignant neoplasm of breast: Secondary | ICD-10-CM | POA: Diagnosis not present

## 2021-09-20 DIAGNOSIS — Z853 Personal history of malignant neoplasm of breast: Secondary | ICD-10-CM | POA: Diagnosis present

## 2021-09-20 DIAGNOSIS — Z800 Family history of malignant neoplasm of digestive organs: Secondary | ICD-10-CM | POA: Diagnosis not present

## 2021-09-20 DIAGNOSIS — M2550 Pain in unspecified joint: Secondary | ICD-10-CM | POA: Diagnosis not present

## 2021-09-20 DIAGNOSIS — Z79899 Other long term (current) drug therapy: Secondary | ICD-10-CM | POA: Diagnosis not present

## 2021-09-20 DIAGNOSIS — R911 Solitary pulmonary nodule: Secondary | ICD-10-CM | POA: Diagnosis not present

## 2021-09-20 NOTE — Progress Notes (Signed)
Referral placed for patient to establish care with Dr Glennon Hamilton, pt with MALT lymphoma and [facility] 1 mutation and needs endoscopic surveillance for gastric cancers. Records faxed to [phone] and phone number of [phone]

## 2021-09-20 NOTE — Progress Notes (Signed)
[place] OFFICE PROGRESS NOTE   Diagnosis: Breast cancer, MALT lymphoma  INTERVAL HISTORY:   Lindsey Rose returns as scheduled.  She generally feels well.  She reports a few night sweats last week.  Good appetite, but she is losing weight.  She notes a "knot "at the right upper back.  She would like to establish with a gastroenterologist and [facility] since Dr. Cristina Rose has retired.  She continues smoking.  Objective:  Vital signs in last 24 hours:  Blood pressure 107/75, pulse 80, temperature 98.1 F (36.7 C), temperature source Oral, resp. rate 18, height '5\' 2"'$  (1.575 m), weight 103 lb 12.8 oz (47.1 kg), SpO2 98 %.    HEENT: Neck without mass Lymphatics: No cervical, supraclavicular, axillary, or inguinal nodes Resp: Lungs clear bilaterally Cardio: Regular rate and rhythm GI: No hepatosplenomegaly, no mass, mild tenderness in the lateral left mid abdomen Vascular: No leg edema  Skin: 1 cm oval mobile cutaneous lesion medial to the upper aspect of the right scapula Breast: Status post bilateral mastectomy with implants in place.  No evidence for chest wall tumor recurrence.  Lab Results:  Lab Results  Component Value Date   WBC 7.4 11/19/2020   HGB 12.3 11/19/2020   HCT 38.1 11/19/2020   MCV 93.6 11/19/2020   PLT 219 11/19/2020   NEUTROABS 3.8 11/19/2020    CMP  Lab Results  Component Value Date   NA 137 11/19/2020   K 3.8 11/19/2020   CL 102 11/19/2020   CO2 29 11/19/2020   GLUCOSE 133 (H) 11/19/2020   BUN 19 11/19/2020   CREATININE 0.81 11/19/2020   CALCIUM 9.0 11/19/2020   PROT 5.3 (L) 08/27/2016   ALBUMIN 2.5 (L) 08/27/2016   AST 19 08/27/2016   ALT 13 (L) 08/27/2016   ALKPHOS 67 08/27/2016   BILITOT 0.2 (L) 08/27/2016   GFRNONAA >60 11/19/2020   GFRAA >60 08/28/2016     Medications: I have reviewed the patient's current medications.   Assessment/Plan: Bilateral invasive lobular breast cancer diagnosed in 04/1999, status post  bilateral mastectomy with implants.  She was treated with adjuvant AC chemotherapy, and began tamoxifen in 08/1999.  She completed 5 years of tamoxifen, and began Femara in 07/2004.  She completed 5 years of Femara in 07/2009.   2.  Status post left shoulder surgery with a decreased range of motion at the left shoulder. 3.  Chronic arthralgias. 4.  Status post right total knee replacement 5.  Ongoing tobacco use-we referred her to the lung cancer screening program on 09/03/2015 6.  History of diverticulitis  7. Family history of breast cancer and gastric cancer, she is confirmed to have a CDH1 mutation Negative upper endoscopy November 2018 Negative upper endoscopy 02/16/2018 Upper endoscopy 06/27/2019 mild inflammation in the stomach, no mass, biopsies revealed chronic inactive gastritis Colonoscopy 06/27/2019 patent colocolonic anastomosis, biopsies to rule out colitis were negative Upper endoscopy 09/16/2020-normal larynx and esophagus; 2 cm hiatal hernia.  Normal stomach, biopsied, normal examined duodenum.  Stomach biopsy with chronic inactive gastritis. 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 Wedge resection of a mildly hypermetabolic right upper lobe nodule 08/25/2016-atypical lymphoid proliferation. B-cell clonality by PCR is positive. 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  01/28/2019 CT chest/abdomen/pelvis-new poorly defined 2.6 x 2.1 cm lesion right lung apex CT chest 04/22/2019-stable 2.1 cm mixed attenuation lesion right lung apex. CT chest 08/21/2019-interval growth of a subsolid right apical nodule, new groundglass attenuation nodule in  the subpleural right middle lobe CT chest 02/10/2020-previous groundglass opacities in the right upper lobe and middle lobes have resolved, no evidence of recurrent or metastatic disease, stable 3 mm triangular subpleural posterior left lower lobe nodule, no new nodules CT chest  11/18/2020-no evidence of malignancy, basilar subpleural groundglass and scattered reticular densities CT chest 08/12/2021-no evidence of malignancy, stable changes of interstitial lung disease, COPD 11.  01/28/2019 CT left shoulder-no evidence of metastatic disease.  No evidence of loosening of the left shoulder arthroplasty.  Old nonunion fracture at the base of the acromium with secondary degenerative changes at the pseudoarticulation.  Scapula otherwise normal.     Disposition: Lindsey Rose is in clinical remission from breast cancer and lymphoma.  We will plan for a restaging chest CT at a 1 year interval.  I will refer her to Lindsey Rose to establish with gastroenterology.  She needs endoscopic surveillance in the setting of a [facility] 1 mutation.  The nodule at the right upper back is likely benign finding.  She will return for a repeat exam in 3 months.  Lindsey Coder, MD  09/20/2021  9:07 AM

## 2021-10-11 ENCOUNTER — Telehealth: Payer: Self-pay

## 2021-10-11 NOTE — Telephone Encounter (Signed)
Patient called and stated she have a lump on her back, and she would like to schedule to come. I advice the patient to call her PCP for the lump on her back.

## 2021-11-04 ENCOUNTER — Telehealth: Payer: Self-pay

## 2021-11-04 NOTE — Telephone Encounter (Signed)
Lindsey Rose called and states she's having new symptoms, and she would like to coming in to see a provider. Symptoms are skin is red bruise  easily. Blister on her right leg. Tumor on the shoulder is growing. Right hand is numb and lose weight. Dr. Benay Spice is aware and he's working on getting the patient in for an appointment

## 2021-11-08 ENCOUNTER — Inpatient Hospital Stay: Payer: Medicare PPO | Attending: Oncology | Admitting: Oncology

## 2021-11-08 VITALS — BP 135/86 | HR 83 | Temp 98.1°F | Resp 18 | Ht 62.0 in | Wt 107.8 lb

## 2021-11-08 DIAGNOSIS — Z853 Personal history of malignant neoplasm of breast: Secondary | ICD-10-CM | POA: Diagnosis present

## 2021-11-08 DIAGNOSIS — Z803 Family history of malignant neoplasm of breast: Secondary | ICD-10-CM | POA: Diagnosis not present

## 2021-11-08 DIAGNOSIS — Z8572 Personal history of non-Hodgkin lymphomas: Secondary | ICD-10-CM | POA: Insufficient documentation

## 2021-11-08 DIAGNOSIS — Z9013 Acquired absence of bilateral breasts and nipples: Secondary | ICD-10-CM | POA: Insufficient documentation

## 2021-11-08 DIAGNOSIS — Z800 Family history of malignant neoplasm of digestive organs: Secondary | ICD-10-CM | POA: Diagnosis not present

## 2021-11-08 NOTE — Progress Notes (Signed)
**Lindsey Lindsey** Lindsey Lindsey   Diagnosis: Breast cancer, lymphoma  INTERVAL HISTORY:   Lindsey Lindsey returns prior to scheduled visit.  She feels the nodule at the right upper back has increased in size.  Her chief complaint is numbness and tingling in the right thumb and hand.  The tingling is intermittent and occurs when she turns her head to the left.  No neck pain.  Good appetite.  No other complaint.  Objective:  Vital signs in last 24 hours:  Blood pressure 135/86, pulse 83, temperature 98.1 F (36.7 C), temperature source Oral, resp. rate 18, height '5\' 2"'$  (1.575 m), weight 107 lb 12.8 oz (48.9 kg), SpO2 98 %.    HEENT: Neck without mass Lymphatics: No cervical, supraclavicular, or axillary nodes Resp: Lungs clear bilaterally Cardio: Regular rate and rhythm GI: No hepatosplenomegaly Vascular: No leg edema Neuro: The motor exam appears intact in the upper extremities bilaterally, 4/5 strength with extension at the right elbow.  Grip strength is intact bilaterally.  The deep tendon reflexes are intact at the triceps and biceps bilaterally.  No pain with motion at the neck Breast: Bilateral mastectomy, no evidence for chest wall tumor recurrence    Lab Results:  Lab Results  Component Value Date   WBC 7.4 11/19/2020   HGB 12.3 11/19/2020   HCT 38.1 11/19/2020   MCV 93.6 11/19/2020   PLT 219 11/19/2020   NEUTROABS 3.8 11/19/2020    CMP  Lab Results  Component Value Date   NA 137 11/19/2020   K 3.8 11/19/2020   CL 102 11/19/2020   CO2 29 11/19/2020   GLUCOSE 133 (H) 11/19/2020   BUN 19 11/19/2020   CREATININE 0.81 11/19/2020   CALCIUM 9.0 11/19/2020   PROT 5.3 (L) 08/27/2016   ALBUMIN 2.5 (L) 08/27/2016   AST 19 08/27/2016   ALT 13 (L) 08/27/2016   ALKPHOS 67 08/27/2016   BILITOT 0.2 (L) 08/27/2016   GFRNONAA >60 11/19/2020   GFRAA >60 08/28/2016     Medications: I have reviewed the patient's current  medications.   Assessment/Plan: Bilateral invasive lobular breast cancer diagnosed in 04/1999, status post bilateral mastectomy with implants.  She was treated with adjuvant AC chemotherapy, and began tamoxifen in 08/1999.  She completed 5 years of tamoxifen, and began Femara in 07/2004.  She completed 5 years of Femara in 07/2009.   2.  Status post left shoulder surgery with a decreased range of motion at the left shoulder. 3.  Chronic arthralgias. 4.  Status post right total knee replacement 5.  Ongoing tobacco use-we referred her to the lung cancer screening program on 09/03/2015 6.  History of diverticulitis  7. Family history of breast cancer and gastric cancer, she is confirmed to have a CDH1 mutation Negative upper endoscopy November 2018 Negative upper endoscopy 02/16/2018 Upper endoscopy 06/27/2019 mild inflammation in the stomach, no mass, biopsies revealed chronic inactive gastritis Colonoscopy 06/27/2019 patent colocolonic anastomosis, biopsies to rule out colitis were negative Upper endoscopy 09/16/2020-normal larynx and esophagus; 2 cm hiatal hernia.  Normal stomach, biopsied, normal examined duodenum.  Stomach biopsy with chronic inactive gastritis. 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 Wedge resection of a mildly hypermetabolic right upper lobe nodule 08/25/2016-atypical lymphoid proliferation. B-cell clonality by PCR is positive. 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  01/28/2019 CT chest/abdomen/pelvis-new poorly defined 2.6 x 2.1 cm lesion right lung apex CT chest 04/22/2019-stable 2.1 cm mixed attenuation lesion right lung  apex. CT chest 08/21/2019-interval growth of a subsolid right apical nodule, new groundglass attenuation nodule in the subpleural right middle lobe CT chest 02/10/2020-previous groundglass opacities in the right upper lobe and middle lobes have resolved, no evidence of recurrent or metastatic  disease, stable 3 mm triangular subpleural posterior left lower lobe nodule, no new nodules CT chest 11/18/2020-no evidence of malignancy, basilar subpleural groundglass and scattered reticular densities CT chest 08/12/2021-no evidence of malignancy, stable changes of interstitial lung disease, COPD 11.  01/28/2019 CT left shoulder-no evidence of metastatic disease.  No evidence of loosening of the left shoulder arthroplasty.  Old nonunion fracture at the base of the acromium with secondary degenerative changes at the pseudoarticulation.  Scapula otherwise normal.      Disposition: Lindsey Lindsey remains in clinical remission from breast cancer and MALT lymphoma.  She is scheduled for an upper endoscopy next month.  The etiology of the right hand numbness and tingling is unclear.  I suspect a cervical spine process.  She will be referred for an MRI of the cervical spine.  Lindsey Lindsey will return for follow-up visit as scheduled in November.  We will follow-up on the cervical spine MRI and see her sooner as indicated.  Betsy Coder, MD  11/08/2021  4:04 PM

## 2021-11-09 ENCOUNTER — Telehealth: Payer: Self-pay | Admitting: *Deleted

## 2021-11-09 NOTE — Telephone Encounter (Signed)
Left VM that insurance will only approve MRI at [facility] or [facility]. Left VM asking for preferred location and latest time she would be willing to have scan. Left VM again on 11/10/21 requesting her guidance on where/when to schedule MRI

## 2021-11-10 ENCOUNTER — Telehealth: Payer: Self-pay | Admitting: *Deleted

## 2021-11-10 NOTE — Telephone Encounter (Signed)
error 

## 2021-11-11 NOTE — Telephone Encounter (Signed)
Spoke w/patient who reports her MRI has been scheduled at [facility] location and she was told by radiology that it is authorized to be done there.

## 2021-11-16 ENCOUNTER — Ambulatory Visit (INDEPENDENT_AMBULATORY_CARE_PROVIDER_SITE_OTHER): Payer: Medicare PPO

## 2021-11-16 DIAGNOSIS — M5412 Radiculopathy, cervical region: Secondary | ICD-10-CM | POA: Diagnosis not present

## 2021-11-16 DIAGNOSIS — Z853 Personal history of malignant neoplasm of breast: Secondary | ICD-10-CM | POA: Diagnosis not present

## 2021-11-16 DIAGNOSIS — M542 Cervicalgia: Secondary | ICD-10-CM | POA: Diagnosis not present

## 2021-11-19 ENCOUNTER — Telehealth: Payer: Self-pay | Admitting: *Deleted

## 2021-11-19 NOTE — Telephone Encounter (Signed)
MD reviewed MRI Cspine report. Needs referral to Dr. Earnie Larsson with [facility]. Left VM for patient to return call on Monday to discuss.

## 2021-11-22 ENCOUNTER — Telehealth: Payer: Self-pay | Admitting: *Deleted

## 2021-11-22 DIAGNOSIS — C884 Extranodal marginal zone B-cell lymphoma of mucosa-associated lymphoid tissue [MALT-lymphoma]: Secondary | ICD-10-CM

## 2021-11-22 DIAGNOSIS — Z853 Personal history of malignant neoplasm of breast: Secondary | ICD-10-CM

## 2021-11-22 NOTE — Telephone Encounter (Signed)
Made Lindsey Rose aware of MRI results-degenerative changes causing spinal canal stenosis. Dr. Benay Spice will be referring her to Dr. Earnie Larsson. She agrees to referral. Faxed referral to [phone]

## 2021-11-29 ENCOUNTER — Encounter: Payer: Self-pay | Admitting: *Deleted

## 2021-12-10 ENCOUNTER — Other Ambulatory Visit: Payer: Self-pay | Admitting: Neurosurgery

## 2021-12-21 ENCOUNTER — Encounter (HOSPITAL_COMMUNITY): Payer: Self-pay

## 2021-12-21 ENCOUNTER — Encounter (HOSPITAL_COMMUNITY)
Admission: RE | Admit: 2021-12-21 | Discharge: 2021-12-21 | Disposition: A | Discharge status: Home / Self Care | Payer: Medicare PPO | Source: Ambulatory Visit | Attending: Neurosurgery | Admitting: Neurosurgery

## 2021-12-21 ENCOUNTER — Inpatient Hospital Stay: Payer: Medicare PPO | Attending: Oncology | Admitting: Oncology

## 2021-12-21 ENCOUNTER — Other Ambulatory Visit: Payer: Self-pay

## 2021-12-21 VITALS — BP 102/63 | HR 86 | Temp 97.8°F | Ht 61.5 in | Wt 104.5 lb

## 2021-12-21 VITALS — BP 97/58 | HR 85 | Temp 98.2°F | Resp 18 | Ht 62.0 in | Wt 104.6 lb

## 2021-12-21 DIAGNOSIS — Z800 Family history of malignant neoplasm of digestive organs: Secondary | ICD-10-CM | POA: Insufficient documentation

## 2021-12-21 DIAGNOSIS — C884 Extranodal marginal zone B-cell lymphoma of mucosa-associated lymphoid tissue [MALT-lymphoma]: Secondary | ICD-10-CM | POA: Diagnosis not present

## 2021-12-21 DIAGNOSIS — Z01818 Encounter for other preprocedural examination: Secondary | ICD-10-CM

## 2021-12-21 DIAGNOSIS — Z79899 Other long term (current) drug therapy: Secondary | ICD-10-CM | POA: Diagnosis not present

## 2021-12-21 DIAGNOSIS — Z8572 Personal history of non-Hodgkin lymphomas: Secondary | ICD-10-CM | POA: Diagnosis present

## 2021-12-21 DIAGNOSIS — Z01812 Encounter for preprocedural laboratory examination: Secondary | ICD-10-CM | POA: Insufficient documentation

## 2021-12-21 DIAGNOSIS — Z803 Family history of malignant neoplasm of breast: Secondary | ICD-10-CM | POA: Insufficient documentation

## 2021-12-21 DIAGNOSIS — Z853 Personal history of malignant neoplasm of breast: Secondary | ICD-10-CM | POA: Diagnosis present

## 2021-12-21 DIAGNOSIS — Z9013 Acquired absence of bilateral breasts and nipples: Secondary | ICD-10-CM | POA: Insufficient documentation

## 2021-12-21 HISTORY — DX: Polyarteritis nodosa: M30.0

## 2021-12-21 LAB — CBC
HCT: 36.3 % (ref 36.0–46.0)
Hemoglobin: 12.1 g/dL (ref 12.0–15.0)
MCH: 30.8 pg (ref 26.0–34.0)
MCHC: 33.3 g/dL (ref 30.0–36.0)
MCV: 92.4 fL (ref 80.0–100.0)
Platelets: 213 10*3/uL (ref 150–400)
RBC: 3.93 MIL/uL (ref 3.87–5.11)
RDW: 13.5 % (ref 11.5–15.5)
WBC: 7.5 10*3/uL (ref 4.0–10.5)
nRBC: 0 % (ref 0.0–0.2)

## 2021-12-21 LAB — TYPE AND SCREEN
ABO/RH(D): A NEG
Antibody Screen: NEGATIVE

## 2021-12-21 LAB — SURGICAL PCR SCREEN
MRSA, PCR: NEGATIVE
Staphylococcus aureus: NEGATIVE

## 2021-12-21 NOTE — Progress Notes (Signed)
[place] OFFICE PROGRESS NOTE   Diagnosis: MALT lymphoma, breast cancer  INTERVAL HISTORY:   Lindsey Rose returns as scheduled.  She continues to have pain at the right neck and numbness/weakness in the right hand.  An MRI of the spine revealed degenerative changes and mass effect on the spinal cord at C3-4 and C4-5.  She saw Dr. Annette Stable and is scheduled for cervical spine surgery next week. Lindsey Rose underwent an upper endoscopy 11/25/2021.  This revealed evidence of mild gastritis, a repeat endoscopy is planned for 1 year.  Objective:  Vital signs in last 24 hours:  Blood pressure (!) 97/58, pulse 85, temperature 98.2 F (36.8 C), temperature source Oral, resp. rate 18, height '5\' 2"'$  (1.575 m), weight 104 lb 9.6 oz (47.4 kg), SpO2 100 %.    HEENT: Neck without mass Lymphatics: No cervical, supraclavicular, axillary, or inguinal nodes Resp: Lungs clear bilaterally Cardio: Regular rate and rhythm GI: No hepatosplenomegaly Vascular: No leg edema Neuro: The motor exam appears intact in the hands bilaterally Skin: 1 cm mobile cutaneous nodule at the right upper back Breast: Status post bilateral mastectomy with implants in place.  No evidence for chest wall tumor recurrence.  Lab Results:  Lab Results  Component Value Date   WBC 7.4 11/19/2020   HGB 12.3 11/19/2020   HCT 38.1 11/19/2020   MCV 93.6 11/19/2020   PLT 219 11/19/2020   NEUTROABS 3.8 11/19/2020    CMP  Lab Results  Component Value Date   NA 137 11/19/2020   K 3.8 11/19/2020   CL 102 11/19/2020   CO2 29 11/19/2020   GLUCOSE 133 (H) 11/19/2020   BUN 19 11/19/2020   CREATININE 0.81 11/19/2020   CALCIUM 9.0 11/19/2020   PROT 5.3 (L) 08/27/2016   ALBUMIN 2.5 (L) 08/27/2016   AST 19 08/27/2016   ALT 13 (L) 08/27/2016   ALKPHOS 67 08/27/2016   BILITOT 0.2 (L) 08/27/2016   GFRNONAA >60 11/19/2020   GFRAA >60 08/28/2016    Medications: I have reviewed the patient's current  medications.   Assessment/Plan: Bilateral invasive lobular breast cancer diagnosed in 04/1999, status post bilateral mastectomy with implants.  She was treated with adjuvant AC chemotherapy, and began tamoxifen in 08/1999.  She completed 5 years of tamoxifen, and began Femara in 07/2004.  She completed 5 years of Femara in 07/2009.   2.  Status post left shoulder surgery with a decreased range of motion at the left shoulder. 3.  Chronic arthralgias. 4.  Status post right total knee replacement 5.  Ongoing tobacco use-we referred her to the lung cancer screening program on 09/03/2015 6.  History of diverticulitis  7. Family history of breast cancer and gastric cancer, she is confirmed to have a CDH1 mutation Negative upper endoscopy November 2018 Negative upper endoscopy 02/16/2018 Upper endoscopy 06/27/2019 mild inflammation in the stomach, no mass, biopsies revealed chronic inactive gastritis Colonoscopy 06/27/2019 patent colocolonic anastomosis, biopsies to rule out colitis were negative Upper endoscopy 09/16/2020-normal larynx and esophagus; 2 cm hiatal hernia.  Normal stomach, biopsied, normal examined duodenum.  Stomach biopsy with chronic inactive gastritis. 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 Wedge resection of a mildly hypermetabolic right upper lobe nodule 08/25/2016-atypical lymphoid proliferation. B-cell clonality by PCR is positive. 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  01/28/2019 CT chest/abdomen/pelvis-new poorly defined 2.6 x 2.1 cm lesion right lung apex CT chest 04/22/2019-stable 2.1 cm  mixed attenuation lesion right lung apex. CT chest 08/21/2019-interval growth of a subsolid right apical nodule, new groundglass attenuation nodule in the subpleural right middle lobe CT chest 02/10/2020-previous groundglass opacities in the right upper lobe and  middle lobes have resolved, no evidence of recurrent or metastatic disease, stable 3 mm triangular subpleural posterior left lower lobe nodule, no new nodules CT chest 11/18/2020-no evidence of malignancy, basilar subpleural groundglass and scattered reticular densities CT chest 08/12/2021-no evidence of malignancy, stable changes of interstitial lung disease, COPD 11.  01/28/2019 CT left shoulder-no evidence of metastatic disease.  No evidence of loosening of the left shoulder arthroplasty.  Old nonunion fracture at the base of the acromium with secondary degenerative changes at the pseudoarticulation.  Scapula otherwise normal. 12.  Right neck/upper back pain and right hand weakness/numbness-MRI cervical spine 11/16/2021-generative changes with spinal canal stenosis at C3-4, C4-5, and C6-7.  More pronounced mass effect on the cord at C3-4 and C4-5       Disposition: Lindsey Rose remains in clinical remission from breast cancer and lymphoma.  She will return for an office visit and surveillance chest CT in 6 months. She will proceed with cervical spine surgery by Dr. Trenton Gammon.  Betsy Coder, MD  12/21/2021  11:57 AM

## 2021-12-21 NOTE — Progress Notes (Addendum)
Surgical Instructions    Your procedure is scheduled on Tuesday November 14.  Report to [facility] Main Entrance "A" at 1005 A.M., then check in with the Admitting office.  Call this number if you have problems the morning of surgery:  [phone]   If you have any questions prior to your surgery date call [phone]: Open Monday-Friday 8am-4pm If you experience any cold or flu symptoms such as cough, fever, chills, shortness of breath, etc. between now and your scheduled surgery, please notify us at the above number     Remember:  Do not eat or drink anything after midnight the night before your surger    Take these medicines the morning of surgery with A SIP OF WATER:  acetaminophen (TYLENOL) if needed buPROPion (WELLBUTRIN XL)  colchicine   HYDROmorphone (DILAUDID)  pantoprazole (PROTONIX)   As of today, STOP taking any Aspirin (unless otherwise instructed by your surgeon),celecoxib (CELEBREX), Aleve, Naproxen, Ibuprofen, Motrin, Advil, Goody's, BC's, all herbal medications, fish oil, and all vitamins.           Do not wear jewelry or makeup. Do not wear lotions, powders, perfumes/cologne or deodorant. Do not shave 48 hours prior to surgery.  Men may shave face and neck. Do not bring valuables to the hospital. Do not wear nail polish, gel polish, artificial nails, or any other type of covering on natural nails (fingers and toes) If you have artificial nails or gel coating that need to be removed by a nail salon, please have this removed prior to surgery. Artificial nails or gel coating may interfere with anesthesia's ability to adequately monitor your vital signs.  [facility] is not responsible for any belongings or valuables.    Do NOT Smoke (Tobacco/Vaping)  24 hours prior to your procedure  If you use a CPAP at night, you may bring your mask for your overnight stay.   Contacts, glasses, hearing aids, dentures or partials may not be worn into surgery, please bring cases  for these belongings   For patients admitted to the hospital, discharge time will be determined by your treatment team.   Patients discharged the day of surgery will not be allowed to drive home, and someone needs to stay with them for 24 hours.   SURGICAL WAITING ROOM VISITATION Patients having surgery or a procedure may have no more than 2 support people in the waiting area - these visitors may rotate.   Children under the age of 72 must have an adult with them who is not the patient. If the patient needs to stay at the hospital during part of their recovery, the visitor guidelines for inpatient rooms apply. Pre-op nurse will coordinate an appropriate time for 1 support person to accompany patient in pre-op.  This support person may not rotate.   Please refer to [email or web address] for the visitor guidelines for Inpatients (after your surgery is over and you are in a regular room).    Special instructions:    Oral Hygiene is also important to reduce your risk of infection.  Remember - BRUSH YOUR TEETH THE MORNING OF SURGERY WITH YOUR REGULAR TOOTHPASTE   [facility]- Preparing For Surgery  Before surgery, you can play an important role. Because skin is not sterile, your skin needs to be as free of germs as possible. You can reduce the number of germs on your skin by washing with CHG (chlorahexidine gluconate) Soap before surgery.  CHG is an antiseptic cleaner which kills germs and bonds with the  skin to continue killing germs even after washing.     Please do not use if you have an allergy to CHG or antibacterial soaps. If your skin becomes reddened/irritated stop using the CHG.  Do not shave (including legs and underarms) for at least 48 hours prior to first CHG shower. It is OK to shave your face.  Please follow these instructions carefully.     Shower the NIGHT BEFORE SURGERY and the MORNING OF SURGERY with CHG Soap.   If you  chose to wash your hair, wash your hair first as usual with your normal shampoo. After you shampoo, rinse your hair and body thoroughly to remove the shampoo.  Then [employer] and genitals (private parts) with your normal soap and rinse thoroughly to remove soap.  After that Use CHG Soap as you would any other liquid soap. You can apply CHG directly to the skin and wash gently with a scrungie or a clean washcloth.   Apply the CHG Soap to your body ONLY FROM THE NECK DOWN.  Do not use on open wounds or open sores. Avoid contact with your eyes, ears, mouth and genitals (private parts). Wash Face and genitals (private parts)  with your normal soap.   Wash thoroughly, paying special attention to the area where your surgery will be performed.  Thoroughly rinse your body with warm water from the neck down.  DO NOT shower/wash with your normal soap after using and rinsing off the CHG Soap.  Pat yourself dry with a CLEAN TOWEL.  Wear CLEAN PAJAMAS to bed the night before surgery  Place CLEAN SHEETS on your bed the night before your surgery  DO NOT SLEEP WITH PETS.   Day of Surgery:  Take a shower with CHG soap. Wear Clean/Comfortable clothing the morning of surgery Do not apply any deodorants/lotions.   Remember to brush your teeth WITH YOUR REGULAR TOOTHPASTE.    If you received a COVID test during your pre-op visit, it is requested that you wear a mask when out in public, stay away from anyone that may not be feeling well, and notify your surgeon if you develop symptoms. If you have been in contact with anyone that has tested positive in the last 10 days, please notify your surgeon.    Please read over the following fact sheets that you were given.

## 2021-12-21 NOTE — Progress Notes (Signed)
PCP - Irving Burton Cardiologist - none  PPM/ICD - denies Device Orders -  Rep Notified -   Chest x-ray - 09/13/16 EKG - 08/23/16 Stress Test - none ECHO - 01/2011 Cardiac Cath - none  Sleep Study - 2021 CPAP - bipap  Fasting Blood Sugar - na Checks Blood Sugar _____ times a day  Last dose of GLP1 agonist-  na GLP1 instructions: na  Blood Thinner Instructions:na Aspirin Instructions:na  ERAS Protcol -no PRE-SURGERY Ensure or G2-   COVID TEST- na   Anesthesia review: no  Patient denies shortness of breath, fever, cough and chest pain at PAT appointment   All instructions explained to the patient, with a verbal understanding of the material. Patient agrees to go over the instructions while at home for a better understanding. Patient also instructed to wear a mask when out in public prior to surgery. The opportunity to ask questions was provided.

## 2021-12-21 NOTE — Progress Notes (Addendum)
Surgical Instructions    Your procedure is scheduled on Tuesday November 14.  Report to [facility] Main Entrance "A" at 1005 A.M., then check in with the Admitting office.  Call this number if you have problems the morning of surgery:  [phone]   If you have any questions prior to your surgery date call [phone]: Open Monday-Friday 8am-4pm If you experience any cold or flu symptoms such as cough, fever, chills, shortness of breath, etc. between now and your scheduled surgery, please notify us at the above number     Remember:  Do not eat or drink anything after midnight the night before your surgery    Take these medicines the morning of surgery with A SIP OF WATER:    As of today, STOP taking any Aspirin (unless otherwise instructed by your surgeon) Aleve, Naproxen, Ibuprofen, Motrin, Advil, Goody's, BC's, all herbal medications, fish oil, and all vitamins.           Do not wear jewelry or makeup. Do not wear lotions, powders, perfumes/cologne or deodorant. Do not shave 48 hours prior to surgery.  Men may shave face and neck. Do not bring valuables to the hospital. Do not wear nail polish, gel polish, artificial nails, or any other type of covering on natural nails (fingers and toes) If you have artificial nails or gel coating that need to be removed by a nail salon, please have this removed prior to surgery. Artificial nails or gel coating may interfere with anesthesia's ability to adequately monitor your vital signs.  [facility] is not responsible for any belongings or valuables.    Do NOT Smoke (Tobacco/Vaping)  24 hours prior to your procedure  If you use a CPAP at night, you may bring your mask for your overnight stay.   Contacts, glasses, hearing aids, dentures or partials may not be worn into surgery, please bring cases for these belongings   For patients admitted to the hospital, discharge time will be determined by your treatment team.   Patients discharged  the day of surgery will not be allowed to drive home, and someone needs to stay with them for 24 hours.   SURGICAL WAITING ROOM VISITATION Patients having surgery or a procedure may have no more than 2 support people in the waiting area - these visitors may rotate.   Children under the age of 21 must have an adult with them who is not the patient. If the patient needs to stay at the hospital during part of their recovery, the visitor guidelines for inpatient rooms apply. Pre-op nurse will coordinate an appropriate time for 1 support person to accompany patient in pre-op.  This support person may not rotate.   Please refer to [email or web address] for the visitor guidelines for Inpatients (after your surgery is over and you are in a regular room).    Special instructions:    Oral Hygiene is also important to reduce your risk of infection.  Remember - BRUSH YOUR TEETH THE MORNING OF SURGERY WITH YOUR REGULAR TOOTHPASTE   [facility]- Preparing For Surgery  Before surgery, you can play an important role. Because skin is not sterile, your skin needs to be as free of germs as possible. You can reduce the number of germs on your skin by washing with CHG (chlorahexidine gluconate) Soap before surgery.  CHG is an antiseptic cleaner which kills germs and bonds with the skin to continue killing germs even after washing.     Please do not use if  you have an allergy to CHG or antibacterial soaps. If your skin becomes reddened/irritated stop using the CHG.  Do not shave (including legs and underarms) for at least 48 hours prior to first CHG shower. It is OK to shave your face.  Please follow these instructions carefully.     Shower the NIGHT BEFORE SURGERY and the MORNING OF SURGERY with CHG Soap.   If you chose to wash your hair, wash your hair first as usual with your normal shampoo. After you shampoo, rinse your hair and body thoroughly to remove the  shampoo.  Then [employer] and genitals (private parts) with your normal soap and rinse thoroughly to remove soap.  After that Use CHG Soap as you would any other liquid soap. You can apply CHG directly to the skin and wash gently with a scrungie or a clean washcloth.   Apply the CHG Soap to your body ONLY FROM THE NECK DOWN.  Do not use on open wounds or open sores. Avoid contact with your eyes, ears, mouth and genitals (private parts). Wash Face and genitals (private parts)  with your normal soap.   Wash thoroughly, paying special attention to the area where your surgery will be performed.  Thoroughly rinse your body with warm water from the neck down.  DO NOT shower/wash with your normal soap after using and rinsing off the CHG Soap.  Pat yourself dry with a CLEAN TOWEL.  Wear CLEAN PAJAMAS to bed the night before surgery  Place CLEAN SHEETS on your bed the night before your surgery  DO NOT SLEEP WITH PETS.   Day of Surgery:  Take a shower with CHG soap. Wear Clean/Comfortable clothing the morning of surgery Do not apply any deodorants/lotions.   Remember to brush your teeth WITH YOUR REGULAR TOOTHPASTE.    If you received a COVID test during your pre-op visit, it is requested that you wear a mask when out in public, stay away from anyone that may not be feeling well, and notify your surgeon if you develop symptoms. If you have been in contact with anyone that has tested positive in the last 10 days, please notify your surgeon.    Please read over the following fact sheets that you were given.

## 2021-12-23 ENCOUNTER — Inpatient Hospital Stay (HOSPITAL_COMMUNITY): Admission: RE | Admit: 2021-12-23 | Source: Ambulatory Visit

## 2021-12-27 NOTE — Progress Notes (Signed)
Patient was called and informed that the surgery time for tomorrow was changed from 12:05 to 11:05 o'clock. Patient was instructed to be at the hospital at 09:05 o'clock. Patient verbalized understanding.

## 2021-12-28 ENCOUNTER — Other Ambulatory Visit: Payer: Self-pay

## 2021-12-28 ENCOUNTER — Encounter (HOSPITAL_COMMUNITY)
Admission: RE | Disposition: A | Discharge status: Home / Self Care | Payer: Self-pay | Source: Home / Self Care | Attending: Neurosurgery

## 2021-12-28 ENCOUNTER — Ambulatory Visit (HOSPITAL_COMMUNITY): Payer: Medicare PPO

## 2021-12-28 ENCOUNTER — Observation Stay (HOSPITAL_COMMUNITY)
Admission: RE | Admit: 2021-12-28 | Discharge: 2021-12-29 | Disposition: A | Discharge status: Home / Self Care | Payer: Medicare PPO | Attending: Neurosurgery | Admitting: Neurosurgery

## 2021-12-28 ENCOUNTER — Encounter (HOSPITAL_COMMUNITY): Payer: Self-pay | Admitting: Neurosurgery

## 2021-12-28 ENCOUNTER — Ambulatory Visit (HOSPITAL_COMMUNITY): Payer: Medicare PPO | Admitting: Certified Registered Nurse Anesthetist

## 2021-12-28 ENCOUNTER — Ambulatory Visit (HOSPITAL_BASED_OUTPATIENT_CLINIC_OR_DEPARTMENT_OTHER): Payer: Medicare PPO | Admitting: Certified Registered Nurse Anesthetist

## 2021-12-28 DIAGNOSIS — Z91040 Latex allergy status: Secondary | ICD-10-CM | POA: Diagnosis not present

## 2021-12-28 DIAGNOSIS — M4712 Other spondylosis with myelopathy, cervical region: Secondary | ICD-10-CM | POA: Diagnosis present

## 2021-12-28 DIAGNOSIS — M4802 Spinal stenosis, cervical region: Secondary | ICD-10-CM

## 2021-12-28 DIAGNOSIS — Z96651 Presence of right artificial knee joint: Secondary | ICD-10-CM | POA: Insufficient documentation

## 2021-12-28 DIAGNOSIS — Z853 Personal history of malignant neoplasm of breast: Secondary | ICD-10-CM | POA: Insufficient documentation

## 2021-12-28 DIAGNOSIS — F17210 Nicotine dependence, cigarettes, uncomplicated: Secondary | ICD-10-CM | POA: Insufficient documentation

## 2021-12-28 DIAGNOSIS — J449 Chronic obstructive pulmonary disease, unspecified: Secondary | ICD-10-CM | POA: Diagnosis not present

## 2021-12-28 DIAGNOSIS — F17200 Nicotine dependence, unspecified, uncomplicated: Secondary | ICD-10-CM

## 2021-12-28 DIAGNOSIS — G959 Disease of spinal cord, unspecified: Secondary | ICD-10-CM | POA: Diagnosis present

## 2021-12-28 DIAGNOSIS — Z96612 Presence of left artificial shoulder joint: Secondary | ICD-10-CM | POA: Diagnosis not present

## 2021-12-28 HISTORY — PX: ANTERIOR CERVICAL DECOMP/DISCECTOMY FUSION: SHX1161

## 2021-12-28 SURGERY — ANTERIOR CERVICAL DECOMPRESSION/DISCECTOMY FUSION 2 LEVELS
Anesthesia: General

## 2021-12-28 MED ORDER — LIDOCAINE 2% (20 MG/ML) 5 ML SYRINGE
INTRAMUSCULAR | Status: DC | PRN
  Administered 2021-12-28: 20 mg via INTRAVENOUS

## 2021-12-28 MED ORDER — HYDROMORPHONE HCL 2 MG PO TABS
4.0000 mg | ORAL_TABLET | Freq: Four times a day (QID) | ORAL | Status: DC | PRN
  Filled 2021-12-28: qty 2

## 2021-12-28 MED ORDER — SUGAMMADEX SODIUM 200 MG/2ML IV SOLN
INTRAVENOUS | Status: DC | PRN
  Administered 2021-12-28: 188.8 mg via INTRAVENOUS

## 2021-12-28 MED ORDER — ADULT MULTIVITAMIN W/MINERALS CH
1.0000 | ORAL_TABLET | Freq: Every day | ORAL | Status: DC
  Filled 2021-12-28: qty 1

## 2021-12-28 MED ORDER — ORAL CARE MOUTH RINSE: 15.0000 mL | Freq: Once | OROMUCOSAL | Status: DC

## 2021-12-28 MED ORDER — PANTOPRAZOLE SODIUM 40 MG PO TBEC
40.0000 mg | DELAYED_RELEASE_TABLET | Freq: Every day | ORAL | Status: DC | PRN
  Administered 2021-12-29: 40 mg via ORAL
  Filled 2021-12-28: qty 1

## 2021-12-28 MED ORDER — ACETAMINOPHEN 10 MG/ML IV SOLN
INTRAVENOUS | Status: DC | PRN
  Administered 2021-12-28: 1000 mg via INTRAVENOUS

## 2021-12-28 MED ORDER — ONDANSETRON HCL 4 MG/2ML IJ SOLN
INTRAMUSCULAR | Status: AC
  Filled 2021-12-28: qty 2

## 2021-12-28 MED ORDER — CHLORHEXIDINE GLUCONATE 0.12 % MT SOLN
15.0000 mL | Freq: Once | OROMUCOSAL | Status: AC
  Administered 2021-12-28: 15 mL via OROMUCOSAL
  Filled 2021-12-28: qty 15

## 2021-12-28 MED ORDER — VITAMIN C 500 MG PO TABS
250.0000 mg | ORAL_TABLET | Freq: Every day | ORAL | Status: DC
  Administered 2021-12-29: 250 mg via ORAL
  Filled 2021-12-28 (×2): qty 1

## 2021-12-28 MED ORDER — POLYETHYLENE GLYCOL 3350 17 G PO PACK: 17.0000 g | PACK | Freq: Every day | ORAL | Status: DC | PRN

## 2021-12-28 MED ORDER — KETAMINE HCL 50 MG/5ML IJ SOSY
PREFILLED_SYRINGE | INTRAMUSCULAR | Status: AC
  Filled 2021-12-28: qty 5

## 2021-12-28 MED ORDER — AMISULPRIDE (ANTIEMETIC) 5 MG/2ML IV SOLN: 10.0000 mg | Freq: Once | INTRAVENOUS | Status: DC | PRN

## 2021-12-28 MED ORDER — ONDANSETRON HCL 4 MG/2ML IJ SOLN: 4.0000 mg | Freq: Four times a day (QID) | INTRAMUSCULAR | Status: DC | PRN

## 2021-12-28 MED ORDER — ACETAMINOPHEN 10 MG/ML IV SOLN
INTRAVENOUS | Status: AC
  Filled 2021-12-28: qty 100

## 2021-12-28 MED ORDER — FENTANYL CITRATE (PF) 250 MCG/5ML IJ SOLN
INTRAMUSCULAR | Status: DC | PRN
  Administered 2021-12-28 (×5): 50 ug via INTRAVENOUS

## 2021-12-28 MED ORDER — LACTATED RINGERS IV SOLN: INTRAVENOUS | Status: DC

## 2021-12-28 MED ORDER — SODIUM CHLORIDE 0.9% FLUSH: 3.0000 mL | INTRAVENOUS | Status: DC | PRN

## 2021-12-28 MED ORDER — FENTANYL CITRATE (PF) 250 MCG/5ML IJ SOLN
INTRAMUSCULAR | Status: AC
  Filled 2021-12-28: qty 5

## 2021-12-28 MED ORDER — VITAMIN B-12 1000 MCG PO TABS
1000.0000 ug | ORAL_TABLET | Freq: Every day | ORAL | Status: DC
  Administered 2021-12-29: 1000 ug via ORAL
  Filled 2021-12-28 (×2): qty 1

## 2021-12-28 MED ORDER — BUPROPION HCL ER (XL) 300 MG PO TB24: 300.0000 mg | ORAL_TABLET | Freq: Every day | ORAL | Status: DC

## 2021-12-28 MED ORDER — MENTHOL 3 MG MT LOZG
1.0000 | LOZENGE | OROMUCOSAL | Status: DC | PRN
  Filled 2021-12-28: qty 9

## 2021-12-28 MED ORDER — CEFAZOLIN SODIUM-DEXTROSE 1-4 GM/50ML-% IV SOLN
1.0000 g | Freq: Three times a day (TID) | INTRAVENOUS | Status: AC
  Administered 2021-12-28 – 2021-12-29 (×2): 1 g via INTRAVENOUS
  Filled 2021-12-28 (×2): qty 50

## 2021-12-28 MED ORDER — PROPOFOL 10 MG/ML IV BOLUS
INTRAVENOUS | Status: DC | PRN
  Administered 2021-12-28: 80 mg via INTRAVENOUS

## 2021-12-28 MED ORDER — THROMBIN 5000 UNITS EX SOLR: OROMUCOSAL | Status: DC | PRN

## 2021-12-28 MED ORDER — HYDROCODONE-ACETAMINOPHEN 10-325 MG PO TABS
2.0000 | ORAL_TABLET | ORAL | Status: DC | PRN
  Administered 2021-12-28 – 2021-12-29 (×5): 2 via ORAL
  Filled 2021-12-28 (×5): qty 2

## 2021-12-28 MED ORDER — HYDROMORPHONE HCL 1 MG/ML IJ SOLN: 1.0000 mg | INTRAMUSCULAR | Status: DC | PRN

## 2021-12-28 MED ORDER — DEXAMETHASONE SODIUM PHOSPHATE 10 MG/ML IJ SOLN
INTRAMUSCULAR | Status: DC | PRN
  Administered 2021-12-28: 10 mg via INTRAVENOUS

## 2021-12-28 MED ORDER — FENTANYL CITRATE (PF) 100 MCG/2ML IJ SOLN
INTRAMUSCULAR | Status: AC
  Filled 2021-12-28: qty 2

## 2021-12-28 MED ORDER — ACETAMINOPHEN 325 MG PO TABS: 650.0000 mg | ORAL_TABLET | ORAL | Status: DC | PRN

## 2021-12-28 MED ORDER — CYCLOBENZAPRINE HCL 10 MG PO TABS
10.0000 mg | ORAL_TABLET | Freq: Three times a day (TID) | ORAL | Status: DC | PRN
  Administered 2021-12-28 – 2021-12-29 (×2): 10 mg via ORAL
  Filled 2021-12-28 (×2): qty 1

## 2021-12-28 MED ORDER — ROCURONIUM BROMIDE 10 MG/ML (PF) SYRINGE
PREFILLED_SYRINGE | INTRAVENOUS | Status: DC | PRN
  Administered 2021-12-28: 40 mg via INTRAVENOUS
  Administered 2021-12-28 (×2): 20 mg via INTRAVENOUS

## 2021-12-28 MED ORDER — PHENOL 1.4 % MT LIQD
1.0000 | OROMUCOSAL | Status: DC | PRN
  Administered 2021-12-29: 1 via OROMUCOSAL
  Filled 2021-12-28: qty 177

## 2021-12-28 MED ORDER — FLUTICASONE PROPIONATE 50 MCG/ACT NA SUSP
1.0000 | Freq: Every day | NASAL | Status: DC | PRN
  Filled 2021-12-28: qty 16

## 2021-12-28 MED ORDER — LIDOCAINE 2% (20 MG/ML) 5 ML SYRINGE
INTRAMUSCULAR | Status: AC
  Filled 2021-12-28: qty 5

## 2021-12-28 MED ORDER — FENTANYL CITRATE (PF) 100 MCG/2ML IJ SOLN
25.0000 ug | INTRAMUSCULAR | Status: DC | PRN
  Administered 2021-12-28: 50 ug via INTRAVENOUS
  Administered 2021-12-28: 25 ug via INTRAVENOUS

## 2021-12-28 MED ORDER — AMPHETAMINE-DEXTROAMPHETAMINE 10 MG PO TABS
20.0000 mg | ORAL_TABLET | Freq: Two times a day (BID) | ORAL | Status: DC
  Filled 2021-12-28: qty 2

## 2021-12-28 MED ORDER — ORAL CARE MOUTH RINSE: 15.0000 mL | Freq: Once | OROMUCOSAL | Status: AC

## 2021-12-28 MED ORDER — DEXAMETHASONE SODIUM PHOSPHATE 10 MG/ML IJ SOLN
INTRAMUSCULAR | Status: AC
  Filled 2021-12-28: qty 1

## 2021-12-28 MED ORDER — THROMBIN 5000 UNITS EX SOLR
CUTANEOUS | Status: AC
  Filled 2021-12-28: qty 15000

## 2021-12-28 MED ORDER — KETAMINE HCL 10 MG/ML IJ SOLN
INTRAMUSCULAR | Status: DC | PRN
  Administered 2021-12-28: 15 mg via INTRAVENOUS

## 2021-12-28 MED ORDER — THROMBIN 5000 UNITS EX SOLR
CUTANEOUS | Status: DC | PRN
  Administered 2021-12-28 (×2): 5000 [IU] via TOPICAL

## 2021-12-28 MED ORDER — HEMOSTATIC AGENTS (NO CHARGE) OPTIME
TOPICAL | Status: DC | PRN
  Administered 2021-12-28: 1 via TOPICAL

## 2021-12-28 MED ORDER — CHLORHEXIDINE GLUCONATE CLOTH 2 % EX PADS: 6.0000 | MEDICATED_PAD | Freq: Once | CUTANEOUS | Status: DC

## 2021-12-28 MED ORDER — SODIUM CHLORIDE 0.9 % IV SOLN: 250.0000 mL | INTRAVENOUS | Status: DC

## 2021-12-28 MED ORDER — ONDANSETRON HCL 4 MG PO TABS: 4.0000 mg | ORAL_TABLET | Freq: Four times a day (QID) | ORAL | Status: DC | PRN

## 2021-12-28 MED ORDER — ACETAMINOPHEN 500 MG PO TABS: 1000.0000 mg | ORAL_TABLET | Freq: Once | ORAL | Status: DC

## 2021-12-28 MED ORDER — ROCURONIUM BROMIDE 10 MG/ML (PF) SYRINGE
PREFILLED_SYRINGE | INTRAVENOUS | Status: AC
  Filled 2021-12-28: qty 10

## 2021-12-28 MED ORDER — CHOLECALCIFEROL 10 MCG (400 UNIT) PO TABS
2000.0000 [IU] | ORAL_TABLET | Freq: Every day | ORAL | Status: DC
  Filled 2021-12-28 (×2): qty 5

## 2021-12-28 MED ORDER — HYDROMORPHONE HCL 1 MG/ML IJ SOLN
INTRAMUSCULAR | Status: DC | PRN
  Administered 2021-12-28 (×2): .5 mg via INTRAVENOUS

## 2021-12-28 MED ORDER — HYDROMORPHONE HCL 1 MG/ML IJ SOLN
INTRAMUSCULAR | Status: AC
  Filled 2021-12-28: qty 1

## 2021-12-28 MED ORDER — ACETAMINOPHEN 650 MG RE SUPP: 650.0000 mg | RECTAL | Status: DC | PRN

## 2021-12-28 MED ORDER — CHLORHEXIDINE GLUCONATE 0.12 % MT SOLN: 15.0000 mL | Freq: Once | OROMUCOSAL | Status: DC

## 2021-12-28 MED ORDER — SODIUM CHLORIDE 0.9% FLUSH
3.0000 mL | Freq: Two times a day (BID) | INTRAVENOUS | Status: DC
  Administered 2021-12-28: 3 mL via INTRAVENOUS

## 2021-12-28 MED ORDER — ONDANSETRON HCL 4 MG/2ML IJ SOLN
INTRAMUSCULAR | Status: DC | PRN
  Administered 2021-12-28: 4 mg via INTRAVENOUS

## 2021-12-28 MED ORDER — BUPROPION HCL ER (XL) 150 MG PO TB24
450.0000 mg | ORAL_TABLET | Freq: Every day | ORAL | Status: DC
  Administered 2021-12-28 – 2021-12-29 (×2): 450 mg via ORAL
  Filled 2021-12-28 (×2): qty 3

## 2021-12-28 MED ORDER — 0.9 % SODIUM CHLORIDE (POUR BTL) OPTIME
TOPICAL | Status: DC | PRN
  Administered 2021-12-28: 1000 mL

## 2021-12-28 MED ORDER — HYDROCODONE-ACETAMINOPHEN 5-325 MG PO TABS: 1.0000 | ORAL_TABLET | ORAL | Status: DC | PRN

## 2021-12-28 MED ORDER — PHENYLEPHRINE HCL-NACL 20-0.9 MG/250ML-% IV SOLN
INTRAVENOUS | Status: DC | PRN
  Administered 2021-12-28: 10 ug/min via INTRAVENOUS

## 2021-12-28 MED ORDER — PROPOFOL 10 MG/ML IV BOLUS
INTRAVENOUS | Status: AC
  Filled 2021-12-28: qty 20

## 2021-12-28 MED ORDER — CEFAZOLIN SODIUM-DEXTROSE 2-4 GM/100ML-% IV SOLN
2.0000 g | INTRAVENOUS | Status: AC
  Administered 2021-12-28: 2 g via INTRAVENOUS
  Filled 2021-12-28: qty 100

## 2021-12-28 SURGICAL SUPPLY — 49 items
BAG COUNTER SPONGE SURGICOUNT (BAG) ×1 IMPLANT
BAG DECANTER FOR FLEXI CONT (MISCELLANEOUS) ×1 IMPLANT
BAND RUBBER #18 3X1/16 STRL (MISCELLANEOUS) ×2 IMPLANT
BENZOIN TINCTURE PRP APPL 2/3 (GAUZE/BANDAGES/DRESSINGS) ×1 IMPLANT
BUR MATCHSTICK NEURO 3.0 LAGG (BURR) ×1 IMPLANT
CAGE PEEK 6X14X11 (Cage) ×2 IMPLANT
CANISTER SUCT 3000ML PPV (MISCELLANEOUS) ×1 IMPLANT
DRAPE C-ARM 42X72 X-RAY (DRAPES) ×2 IMPLANT
DRAPE LAPAROTOMY 100X72 PEDS (DRAPES) ×1 IMPLANT
DRAPE MICROSCOPE SLANT 54X150 (MISCELLANEOUS) ×1 IMPLANT
DURAPREP 6ML APPLICATOR 50/CS (WOUND CARE) ×1 IMPLANT
ELECT COATED BLADE 2.86 ST (ELECTRODE) ×1 IMPLANT
ELECT REM PT RETURN 9FT ADLT (ELECTROSURGICAL) ×1
ELECTRODE REM PT RTRN 9FT ADLT (ELECTROSURGICAL) ×1 IMPLANT
GAUZE 4X4 16PLY ~~LOC~~+RFID DBL (SPONGE) IMPLANT
GAUZE SPONGE 4X4 12PLY STRL (GAUZE/BANDAGES/DRESSINGS) ×1 IMPLANT
GLOVE ECLIPSE 9.0 STRL (GLOVE) ×1 IMPLANT
GLOVE EXAM NITRILE XL STR (GLOVE) IMPLANT
GOWN STRL REUS W/ TWL LRG LVL3 (GOWN DISPOSABLE) IMPLANT
GOWN STRL REUS W/ TWL XL LVL3 (GOWN DISPOSABLE) ×1 IMPLANT
GOWN STRL REUS W/TWL 2XL LVL3 (GOWN DISPOSABLE) IMPLANT
GOWN STRL REUS W/TWL LRG LVL3 (GOWN DISPOSABLE)
GOWN STRL REUS W/TWL XL LVL3 (GOWN DISPOSABLE) ×1
HALTER HD/CHIN CERV TRACTION D (MISCELLANEOUS) ×1 IMPLANT
HEMOSTAT POWDER KIT SURGIFOAM (HEMOSTASIS) IMPLANT
HEMOSTAT SURGICEL 2X14 (HEMOSTASIS) IMPLANT
KIT BASIN OR (CUSTOM PROCEDURE TRAY) ×1 IMPLANT
KIT TURNOVER KIT B (KITS) ×1 IMPLANT
NDL SPNL 20GX3.5 QUINCKE YW (NEEDLE) ×1 IMPLANT
NEEDLE SPNL 20GX3.5 QUINCKE YW (NEEDLE) ×1 IMPLANT
NS IRRIG 1000ML POUR BTL (IV SOLUTION) ×1 IMPLANT
PACK LAMINECTOMY NEURO (CUSTOM PROCEDURE TRAY) ×1 IMPLANT
PAD ARMBOARD 7.5X6 YLW CONV (MISCELLANEOUS) ×3 IMPLANT
PLATE ELITE 42MM (Plate) IMPLANT
SCREW ST 13X4XST VA NS SPNE (Screw) IMPLANT
SCREW ST VAR 4 ATL (Screw) ×6 IMPLANT
SPACER SPNL 11X14X6XPEEK CVD (Cage) IMPLANT
SPCR SPNL 11X14X6XPEEK CVD (Cage) ×2 IMPLANT
SPONGE INTESTINAL PEANUT (DISPOSABLE) ×1 IMPLANT
SPONGE SURGIFOAM ABS GEL SZ50 (HEMOSTASIS) ×1 IMPLANT
STRIP CLOSURE SKIN 1/2X4 (GAUZE/BANDAGES/DRESSINGS) ×1 IMPLANT
SUT VIC AB 3-0 SH 8-18 (SUTURE) ×1 IMPLANT
SUT VIC AB 4-0 RB1 18 (SUTURE) ×1 IMPLANT
TAPE CLOTH 4X10 WHT NS (GAUZE/BANDAGES/DRESSINGS) ×1 IMPLANT
TAPE CLOTH SURG 4X10 WHT LF (GAUZE/BANDAGES/DRESSINGS) IMPLANT
TOWEL GREEN STERILE (TOWEL DISPOSABLE) ×1 IMPLANT
TOWEL GREEN STERILE FF (TOWEL DISPOSABLE) ×1 IMPLANT
TRAP SPECIMEN MUCUS 40CC (MISCELLANEOUS) ×1 IMPLANT
WATER STERILE IRR 1000ML POUR (IV SOLUTION) ×1 IMPLANT

## 2021-12-28 NOTE — Brief Op Note (Signed)
12/28/2021  12:32 PM  PATIENT:  Daphine Deutscher  71 y.o. female  PRE-OPERATIVE DIAGNOSIS:  Stenosis  POST-OPERATIVE DIAGNOSIS:  Stenosis  PROCEDURE:  Procedure(s): Anterior Cervical Diskectomy Fusion Cervical three-four, Cervical four-five (N/A)  SURGEON:  Surgeon(s) and Role:    * Earnie Larsson, MD - Primary    * Marcello Moores Dorcas Carrow, MD - Assisting  PHYSICIAN ASSISTANT:   ASSISTANTSMearl Latin   ANESTHESIA:   general  EBL:  50 mL   BLOOD ADMINISTERED:none  DRAINS: none   LOCAL MEDICATIONS USED:  NONE  SPECIMEN:  No Specimen  DISPOSITION OF SPECIMEN:  N/A  COUNTS:  YES  TOURNIQUET:  * No tourniquets in log *  DICTATION: .Dragon Dictation  PLAN OF CARE: Admit for overnight observation  PATIENT DISPOSITION:  PACU - hemodynamically stable.   Delay start of Pharmacological VTE agent (>24hrs) due to surgical blood loss or risk of bleeding: yes

## 2021-12-28 NOTE — H&P (Signed)
Lindsey Rose is an 71 y.o. female.   Chief Complaint: Neck pain HPI: 71 year old female with progressively worsening neck pain with radiation to her upper extremities right greater than left with associated numbness paresthesias and some weakness.  Work-up demonstrates evidence of extreme multilevel cervical disc degeneration with severe spinal stenosis at C4-5 with ongoing cord compression and cord signal abnormality.  Patient also with severe stenosis at C3-4.  Patient with autofusion at C5-6 and although she has some distraction at C6-7 this does not cause any compressive pathology at present.  Plan is for C3-4 and C4-5 anterior cervical decompression and fusion in hopes of improving her symptoms.  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[place])    Respiratory distress fall 2012   "not on ventilator"   Sleep apnea     Past Surgical History:  Procedure Laterality Date   ABDOMINAL HYSTERECTOMY  1976   "partial"   [place] BIOPSY  2001   right   CATARACT EXTRACTION, BILATERAL     01/2016   CHOLECYSTECTOMY  1971   COLON SURGERY  2021   EYE SURGERY     FRACTURE SURGERY     JOINT REPLACEMENT     Left shoulder and Right knee   MASTECTOMY  2001   Bilateral with lymph node removal on the Left   ORIF SHOULDER FRACTURE  06/2006   left   PORT-A-CATH REMOVAL  2001   right chest; "for chemo"   SHOULDER  SURGERY  10/2006   left; "put pins in it"   TOTAL KNEE ARTHROPLASTY  12/2006   right   TOTAL SHOULDER REPLACEMENT     VIDEO ASSISTED THORACOSCOPY (VATS)/WEDGE RESECTION Right 08/25/2016   Procedure: RIGHT VIDEO ASSISTED THORACOSCOPY (VATS)/RIGHT UPPER LOBE WEDGE RESECTION;  Surgeon: Melrose Nakayama, MD;  Location: [facility] OR;  Service: Thoracic;  Laterality: Right;    Family History  Problem Relation Age of Onset   Heart disease Father        MI at age 72   Gastric cancer Father 79   Hypertension Mother    Diabetes Mother    Colon polyps Mother        approx 2-3   Aneurysm Paternal Grandmother 50   Stroke Maternal Grandfather        d. 50s   Breast cancer Sister 78       negative for familial CDH1 mutation   Breast cancer Sister 33       negative for familial CDH1 mutation   Breast cancer Sister 56       s/p mastectomy   Other Sister        +CDH1 mutation   Colon polyps Sister  20+   Liver cancer Sister 18   Gastric cancer Paternal Aunt 29       s/p partial gastrectomy   Breast cancer Other 66       niece; negative for familial CDH1 mutation   Other Other 21       nephew d. due to congenital birth defect (affecting brain)   Aneurysm Maternal Uncle        multiple maternal uncles d. due to brain aneurysms, <50   Lymphoma Cousin 35       maternal 1st cousin   Breast cancer Cousin        maternal 1st cousin dx. in her 51s   Breast cancer Other        maternal great aunt (MGF's sister) d. 26s; s/p mastectomy   Diabetes Other    Social History:  reports that she has been smoking cigarettes. She has a 10.50 pack-year smoking history. She has never used smokeless tobacco. She reports current alcohol use of about 2.0 standard drinks of alcohol per week. She reports that she does not use drugs.  Allergies:  Allergies  Allergen Reactions   Ivp Dye [Iodinated Contrast Media] Hives and Shortness Of Breath   Naproxen Sodium Hives   Ciprofloxacin     Other  Reaction(s): Agitation   Metronidazole     Other Reaction(s): Other    Headache   Morphine Hives   Latex Other (See Comments)    Breaks out in area that she's touched with latex    Mometasone Furo-Formoterol Fum     Other Reaction(s): Other (See Comments)    Blisters on tongue   Oxycodone Hives    Medications Prior to Admission  Medication Sig Dispense Refill   acetaminophen (TYLENOL) 325 MG tablet Take 325 mg by mouth every 6 (six) hours as needed for mild pain.     amphetamine-dextroamphetamine (ADDERALL) 20 MG tablet Take 20 mg by mouth 2 (two) times daily.     Ascorbic Acid (VITAMIN C) 100 MG tablet Take 100 mg by mouth daily.     buPROPion (WELLBUTRIN XL) 150 MG 24 hr tablet Take 150 mg by mouth daily.     buPROPion (WELLBUTRIN XL) 300 MG 24 hr tablet Take 300 mg by mouth daily.     CALCIUM-MAGNESIUM-ZINC PO Take 1 tablet by mouth 2 (two) times daily.     celecoxib (CELEBREX) 100 MG capsule Take 100 mg by mouth 2 (two) times daily.     Cholecalciferol (VITAMIN D) 2000 units CAPS Take 2,000 Units by mouth daily.     colchicine 0.6 MG tablet Take 0.6 mg by mouth every other day.     cyanocobalamin 1000 MCG tablet Take 1,000 mcg by mouth daily.      HYDROmorphone (DILAUDID) 4 MG tablet Take 4 mg by mouth every 6 (six) hours as needed for moderate pain.     Multiple Vitamin (MULITIVITAMIN WITH MINERALS) TABS Take 1 tablet by mouth daily.     OVER THE COUNTER MEDICATION Take 1 tablet by mouth daily.     pantoprazole (PROTONIX) 40 MG tablet Take 40 mg by mouth daily as needed (acid reflux). Acid reflux  0   polyethylene glycol powder (GLYCOLAX/MIRALAX) powder Take 17 g by mouth daily as needed for mild constipation. constipation     Respiratory Therapy Supplies DEVI CPAP @@ hs     fluticasone (FLONASE) 50 MCG/ACT nasal spray Place 1 spray into both nostrils daily as needed for allergies.  No results found for this or any previous visit (from the past 48 hour(s)). No results  found.  Pertinent items noted in HPI and remainder of comprehensive ROS otherwise negative.  Blood pressure 118/83, pulse 89, temperature 98.4 F (36.9 C), temperature source Oral, resp. rate 18, height 5' 1.5" (1.562 m), weight 47.2 kg, SpO2 96 %.  Patient is awake and alert.  She is oriented and appropriate.  Speech is fluent.  Judgment insight are intact.  Cranial nerve function normal by.  Motor examination reveals mild weakness of her intrinsic function in her right hand otherwise motor strength intact.  Sensory examination with decrease sensation pinprick light touch in her right upper extremity diffusely.  Patient also with some distal numbness and paresthesias in her left hand.  Gait is unsteady.  Posture is normal.  Reflexes are hyperactive.  No evidence of long track signs.  Examination head ears eyes nose throat is unremarked.  Chest and abdomen are benign.  Extremities are free from injury or deformity. Assessment/Plan Cervical stenosis with myelopathy.  Plan C3-4, C4-5 anterior cervical discectomy and fusion with interbody cages, local harvested autograft, and anterior plate instrumentation.  Risks and benefits of been explained.  Patient wishes to proceed.  Mallie Mussel A Sharanya Templin 12/28/2021, 10:06 AM

## 2021-12-28 NOTE — Op Note (Signed)
Date of procedure: 12/28/2021  Date of dictation: Same  Service: Neurosurgery  Preoperative diagnosis: C3-4, C4-5 spondylosis with stenosis and myelopathy  Postoperative diagnosis: Same  Procedure Name: C3-4, C4-5 anterior cervical discectomy with interbody fusion utilizing interbody cage, with harvested autograft, and anterior plate instrumentation  Surgeon:Valetta Mulroy A.Tvisha Schwoerer, M.D.  Asst. Surgeon: Marcello Moores, MD; Reinaldo Meeker, NP  Anesthesia: General  Indication: 71 year old female with severe neck and upper extremity pain right greater than left.  Work-up demonstrates evidence of severe cervical spondylosis with severe spinal stenosis and ongoing cord compression with cord signal change at C3-4 and probably at C4-5.  Patient with autofusion of C5-6.  She has spondylosis and some early stenosis at C6-7 which is noncompressive.  Patient presents now for C3-4 and C4-5 anterior cervical decompression and fusion in hopes of improving her symptoms.  Operative note: After induction of anesthesia, patient positioned supine with neck slightly extended and held placed halter traction.  Patient's anterior cervical region prepped draped sterilely.  Incision made overlying C4.  Dissection performed on the right.  Retractor placed.  Fluoroscopy used.  Levels confirmed.  Disc bases incised.  Discectomy was then performed using various instruments to remove all elements of the disc down to level posterior annulus.  The microscope was then brought into the field used throughout the remainder of the discectomy.  Remaining aspects of annulus and osteophytes removed using high-speed drill down to level of the posterior longitudinal ligament.  Posterior logical was then elevated and resected.  Underlying thecal sac was identified.  A wide central decompression then performed undercutting the bodies of C3 and C4.  Decompression then proceeded which neural foramina.  Wide anterior foraminotomies performed on the course exiting C4  nerve roots bilaterally.  At this point a very thorough decompression of been achieved.  There was no evidence of injury to the thecal sac or nerve roots.  Procedures then repeated at C4-5 again without complications.  Wound was then irrigated.  [employer] anatomic peek cages and then packed with locally harvested autograft.  Cage was then impacted into place at both levels where they were recessed slightly from the anterior cortical margin.  Atlantis anterior cervical plate was then placed over the C3, C4 and C5 levels.  This is then attached under fluoroscopic guidance using 13 mm variable angle screws to each at all 3 levels.  All screws given final tightening found to be solidly within the bone.  Locking screws engaged all levels.  Final images reveal good position of the cages and the proper operative level with normal alignment of spine.  Wound was then irrigated 1 final time.  Hemostasis was assured with bipolar cautery.  Wound was then closed in layers with Vicryl sutures.  Steri-Strips and sterile dressing were applied.  No apparent complications.  Patient tolerated the procedure well and she returns to the recovery room.

## 2021-12-28 NOTE — Anesthesia Procedure Notes (Addendum)
Procedure Name: Intubation Date/Time: 12/28/2021 10:58 AM  Performed by: Maude Leriche, CRNAPre-anesthesia Checklist: Patient identified, Emergency Drugs available, Suction available and Patient being monitored Patient Re-evaluated:Patient Re-evaluated prior to induction Oxygen Delivery Method: Circle system utilized Preoxygenation: Pre-oxygenation with 100% oxygen Induction Type: IV induction Ventilation: Mask ventilation without difficulty Laryngoscope Size: Miller and 2 Grade View: Grade I Tube type: Oral Tube size: 7.0 mm Number of attempts: 1 Placement Confirmation: ETT inserted through vocal cords under direct vision, positive ETCO2 and breath sounds checked- equal and bilateral Secured at: 20 cm Tube secured with: Tape Dental Injury: Teeth and Oropharynx as per pre-operative assessment

## 2021-12-28 NOTE — Transfer of Care (Signed)
Immediate Anesthesia Transfer of Care Note  Patient: Lindsey Rose  Procedure(s) Performed: Anterior Cervical Diskectomy Fusion Cervical three-four, Cervical four-five  Patient Location: PACU  Anesthesia Type:General  Level of Consciousness: awake, alert , and oriented  Airway & Oxygen Therapy: Patient Spontanous Breathing and Patient connected to face mask oxygen  Post-op Assessment: Report given to RN, Post -op Vital signs reviewed and stable, Patient moving all extremities X 4, and Patient able to stick tongue midline  Post vital signs: Reviewed  Last Vitals:  Vitals Value Taken Time  BP 144/92 12/28/21 1251  Temp 36.6 C 12/28/21 1245  Pulse 93 12/28/21 1255  Resp 15 12/28/21 1255  SpO2 95 % 12/28/21 1255  Vitals shown include unvalidated device data.  Last Pain:  Vitals:   12/28/21 0928  TempSrc:   PainSc: 0-No pain         Complications: No notable events documented.

## 2021-12-28 NOTE — Anesthesia Preprocedure Evaluation (Signed)
Anesthesia Evaluation  Patient identified by MRN, date of birth, ID band Patient awake    Reviewed: Allergy & Precautions, NPO status , Patient's Chart, lab work & pertinent test results  Airway Mallampati: II  TM Distance: >3 FB Neck ROM: Full    Dental  (+) Dental Advisory Given   Pulmonary sleep apnea , COPD, Current Smoker and Patient abstained from smoking.   breath sounds clear to auscultation       Cardiovascular negative cardio ROS  Rhythm:Regular Rate:Normal     Neuro/Psych  Neuromuscular disease    GI/Hepatic Neg liver ROS,GERD  ,,  Endo/Other  negative endocrine ROS    Renal/GU negative Renal ROS     Musculoskeletal  (+) Arthritis ,    Abdominal   Peds  Hematology  (+) Blood dyscrasia, anemia   Anesthesia Other Findings   Reproductive/Obstetrics                              Lab Results  Component Value Date   WBC 7.5 12/21/2021   HGB 12.1 12/21/2021   HCT 36.3 12/21/2021   MCV 92.4 12/21/2021   PLT 213 12/21/2021   Lab Results  Component Value Date   CREATININE 0.81 11/19/2020   BUN 19 11/19/2020   NA 137 11/19/2020   K 3.8 11/19/2020   CL 102 11/19/2020   CO2 29 11/19/2020    Anesthesia Physical Anesthesia Plan  ASA: 3  Anesthesia Plan: General   Post-op Pain Management: Tylenol PO (pre-op)*   Induction: Intravenous  PONV Risk Score and Plan: 2 and Dexamethasone, Ondansetron and Treatment may vary due to age or medical condition  Airway Management Planned: Oral ETT  Additional Equipment: None  Intra-op Plan:   Post-operative Plan: Extubation in OR  Informed Consent: I have reviewed the patients History and Physical, chart, labs and discussed the procedure including the risks, benefits and alternatives for the proposed anesthesia with the patient or authorized representative who has indicated his/her understanding and acceptance.     Dental  advisory given  Plan Discussed with: CRNA  Anesthesia Plan Comments:          Anesthesia Quick Evaluation

## 2021-12-28 NOTE — Anesthesia Postprocedure Evaluation (Signed)
Anesthesia Post Note  Patient: Lindsey Rose  Procedure(s) Performed: Anterior Cervical Diskectomy Fusion Cervical three-four, Cervical four-five     Patient location during evaluation: PACU Anesthesia Type: General Level of consciousness: awake and alert Pain management: pain level controlled Vital Signs Assessment: post-procedure vital signs reviewed and stable Respiratory status: spontaneous breathing, nonlabored ventilation, respiratory function stable and patient connected to nasal cannula oxygen Cardiovascular status: blood pressure returned to baseline and stable Postop Assessment: no apparent nausea or vomiting Anesthetic complications: no   No notable events documented.  Last Vitals:  Vitals:   12/28/21 1345 12/28/21 1422  BP: 139/70 139/78  Pulse: 78 85  Resp: 18 18  Temp: 36.6 C 36.6 C  SpO2: 95% 98%    Last Pain:  Vitals:   12/28/21 1345  TempSrc:   PainSc: 6                  Tiajuana Amass

## 2021-12-29 ENCOUNTER — Encounter (HOSPITAL_COMMUNITY): Payer: Self-pay | Admitting: Neurosurgery

## 2021-12-29 DIAGNOSIS — M4712 Other spondylosis with myelopathy, cervical region: Secondary | ICD-10-CM | POA: Diagnosis not present

## 2021-12-29 NOTE — Plan of Care (Signed)

## 2021-12-29 NOTE — Evaluation (Signed)
Occupational Therapy Evaluation Patient Details Name: Lindsey Rose MRN: [id number] DOB: 1950-03-30 Today's Date: 12/29/2021   History of Present Illness Pt is a 71 y.o. female s/p ACDF C3-5. PMH significant for ADHD, anemia, anxiety, arthritis, breast cancer, depression, diverticulosis, GERD, lung nodule, polyarteritis nodosa, L shoulder replacement, and R knee replacement.   Clinical Impression   PTA, pt lived with her husband who has recently been assisting with home management tasks. Upon eval, pt with improved RUE numbness, ROM, and pain. Pt performing UB Adl with up to min A and LB ADL with up to supervision. Pt educated and demonstrating use of compensatory techniques for bed mobility, UB dressing, LB dressing, toileting, shower transfers, stair navigation, grooming, and car transfer. Reviewing movement/exercise recommendations of walking and frequency. All questions answered. Husband present and reporting able to assist as needed at home. Recommend discharge home with no OT follow up at this time. OT to sign off. Thank you for this order; please re-consult if change in status.      Recommendations for follow up therapy are one component of a multi-disciplinary discharge planning process, led by the attending physician.  Recommendations may be updated based on patient status, additional functional criteria and insurance authorization.   Follow Up Recommendations  No OT follow up     Assistance Recommended at Discharge Intermittent Supervision/Assistance  Patient can return home with the following A little help with bathing/dressing/bathroom;Assistance with cooking/housework;Assist for transportation;Help with stairs or ramp for entrance    Functional Status Assessment  Patient has had a recent decline in their functional status and demonstrates the ability to make significant improvements in function in a reasonable and predictable amount of time.  Equipment Recommendations  None  recommended by OT    Recommendations for Other Services       Precautions / Restrictions Precautions Precautions: Cervical Precaution Booklet Issued: Yes (comment) Precaution Comments: All education provided within context of ADL and pt with recall at end of session using teach back method. Required Braces or Orthoses: Cervical Brace Cervical Brace: Soft collar Restrictions Weight Bearing Restrictions: No      Mobility Bed Mobility Overal bed mobility: Modified Independent             General bed mobility comments: reviewing log roll. Pt's HOB can also be elevated to nearlyb 90 degrees    Transfers Overall transfer level: Needs assistance Equipment used: None Transfers: Sit to/from Stand Sit to Stand: Supervision           General transfer comment: initially for rise.      Balance Overall balance assessment: Mild deficits observed, not formally tested                                         ADL either performed or assessed with clinical judgement   ADL Overall ADL's : Needs assistance/impaired Eating/Feeding: Modified independent;Sitting   Grooming: Supervision/safety;Standing   Upper Body Bathing: Sitting;Set up   Lower Body Bathing: Supervison/ safety;Sit to/from stand   Upper Body Dressing : Minimal assistance;Sitting Upper Body Dressing Details (indicate cue type and reason): Min A to bring tank top over head without forward flexion of neck. Reviewing alternative methods, and pt able to verbalize understanding and describe in her own words Lower Body Dressing: Supervision/safety;Sit to/from stand Lower Body Dressing Details (indicate cue type and reason): supervision for safety Toilet Transfer: [place]  Manipulation and Hygiene: Supervision/safety   Tub/ Shower Transfer: Gaffer;Shower seat;Ambulation;Supervision/safety Tub/Shower Transfer Details (indicate cue type and reason): Supervision  for safety;reviewing compensatory techniques Functional mobility during ADLs: Supervision/safety;Min guard General ADL Comments: Initially min guard A for functional mobility due to decreased balance with guarding; as pt became more comfortable walking with normalized gait pattern and arm swing, progressing to supervision.     Vision Baseline Vision/History: 1 Wears glasses Ability to See in Adequate Light: 0 Adequate Patient Visual Report: No change from baseline Vision Assessment?: No apparent visual deficits Additional Comments: reading handout     Perception Perception Perception Tested?: No   Praxis Praxis Praxis tested?: Not tested    Pertinent Vitals/Pain Pain Assessment Pain Assessment: Faces Faces Pain Scale: Hurts a little bit Pain Location: throat Pain Descriptors / Indicators: Discomfort, Tender, Aching Pain Intervention(s): Limited activity within patient's tolerance, Monitored during session     Hand Dominance     Extremity/Trunk Assessment Upper Extremity Assessment Upper Extremity Assessment: Generalized weakness (Pt reporting resolved numbness/tingling/feels the same on both sides. ROM WFL, decreased grip strength)   Lower Extremity Assessment Lower Extremity Assessment: Generalized weakness   Cervical / Trunk Assessment Cervical / Trunk Assessment: Neck Surgery   Communication Communication Communication: No difficulties   Cognition Arousal/Alertness: Awake/alert Behavior During Therapy: WFL for tasks assessed/performed Overall Cognitive Status: Within Functional Limits for tasks assessed                                 General Comments: Maintaining precautions throughout     General Comments  VSS.  Husband present and supportive    Exercises     Shoulder Instructions      Home Living Family/patient expects to be discharged to:: Private residence Living Arrangements: Spouse/significant other Available Help at Discharge:  Family Type of Home: House Home Access: Stairs to enter [employer] of Steps: small threshold   Home Layout: One level;Other (Comment) (One step down into screened in porch)     Bathroom Shower/Tub: Occupational psychologist: Standard     Home Equipment: Shower seat - built in          Prior Functioning/Environment Prior Level of Function : Independent/Modified Independent;Driving;Needs assist             Mobility Comments: no AD ADLs Comments: Pt reports husband had recently been helping with home maintenance        OT Problem List: Decreased strength;Decreased activity tolerance;Impaired balance (sitting and/or standing);Pain      OT Treatment/Interventions:      OT Goals(Current goals can be found in the care plan section) Acute Rehab OT Goals Patient Stated Goal: go home OT Goal Formulation: With patient  OT Frequency:      Co-evaluation              AM-PAC OT "6 Clicks" Daily Activity     Outcome Measure Help from another person eating meals?: None Help from another person taking care of personal grooming?: A Little Help from another person toileting, which includes using toliet, bedpan, or urinal?: A Little Help from another person bathing (including washing, rinsing, drying)?: A Little Help from another person to put on and taking off regular upper body clothing?: A Little Help from another person to put on and taking off regular lower body clothing?: A Little 6 Click Score: 19   End of Session Equipment Utilized During Treatment: Cervical  collar Nurse Communication: Mobility status  Activity Tolerance: Patient tolerated treatment well Patient left: in bed;with call bell/phone within reach;with family/visitor present;with nursing/sitter in room  OT Visit Diagnosis: Unsteadiness on feet (R26.81);Muscle weakness (generalized) (M62.81);Other abnormalities of gait and mobility (R26.89);Pain Pain - part of body:  (throat)                 Time: 4114-6431 OT Time Calculation (min): 22 min Charges:  OT General Charges $OT Visit: 1 Visit OT Evaluation $OT Eval Low Complexity: 1 Low  Shanda Howells, OTR/L [facility] Acute Rehabilitation Office: [phone]   Magnus Ivan 12/29/2021, 8:46 AM

## 2021-12-29 NOTE — Progress Notes (Signed)
Patient alert and oriented, mae's well, voiding adequate amount of urine, swallowing without difficulty, no c/o pain at time of discharge. Patient discharged home with family. Script and discharged instructions given to patient. Patient and family stated understanding of instructions given. Patient has an appointment with Dr. Pool in 2 weeks 

## 2021-12-29 NOTE — Discharge Instructions (Addendum)
Wound Care Keep incision covered and dry for two days.  If you shower, cover incision with plastic wrap.  Do not put any creams, lotions, or ointments on incision. Leave steri-strips on back.  They will fall off by themselves. Activity Walk each and every day, increasing distance each day. No lifting greater than 5 lbs.  Avoid excessive neck motion. No driving for 2 weeks; may ride as a passenger locally. If provided with back brace, wear when out of bed.  It is not necessary to wear brace in bed. Diet Resume your normal diet.   Call Your Doctor If Any of These Occur Redness, drainage, or swelling at the wound.  Temperature greater than 101 degrees. Severe pain not relieved by pain medication. Incision starts to come apart. Follow Up Appt Call  [phone]) for problems.  If you have any hardware placed in your spine, you will need an x-ray before your appointment.

## 2021-12-29 NOTE — Discharge Summary (Signed)
Physician Discharge Summary  Patient ID: Lindsey Rose MRN: [id number] DOB/AGE: Jul 07, 1950 71 y.o.  Admit date: 12/28/2021 Discharge date: 12/29/2021  Admission Diagnoses:  Discharge Diagnoses:  Principal Problem:   Cervical myelopathy [facility])   Discharged Condition: good  Hospital Course: Patient admitted to hospital where she underwent uncomplicated two-level anterior cervical decompression and fusion.  Postoperatively doing well.  Preoperative neck and upper extremity pain numbness and weakness are much improved.  Standing and walking and voiding without difficulty.  Swallowing well.  Voice reasonably strong.  Consults:   Significant Diagnostic Studies:   Treatments:   Discharge Exam: Blood pressure (!) 143/92, pulse 94, temperature 98.5 F (36.9 C), temperature source Oral, resp. rate 16, height 5' 1.5" (1.562 m), weight 47.2 kg, SpO2 96 %. Awake and alert.  Oriented and appropriate.  Motor and sensory function intact.  Wound clean and dry.  Chest and abdomen benign.  Disposition: Discharge disposition: 01-Home or Self Care        Allergies as of 12/29/2021       Reactions   Ivp Dye [iodinated Contrast Media] Hives, Shortness Of Breath   Naproxen Sodium Hives   Ciprofloxacin    Other Reaction(s): Agitation   Metronidazole    Other Reaction(s): Other    Headache   Morphine Hives   Latex Other (See Comments)   Breaks out in area that she's touched with latex    Mometasone Furo-formoterol Fum    Other Reaction(s): Other (See Comments)    Blisters on tongue   Oxycodone Hives        Medication List     TAKE these medications    acetaminophen 325 MG tablet Commonly known as: TYLENOL Take 325 mg by mouth every 6 (six) hours as needed for mild pain.   amphetamine-dextroamphetamine 20 MG tablet Commonly known as: ADDERALL Take 20 mg by mouth 2 (two) times daily.   buPROPion 150 MG 24 hr tablet Commonly known as: WELLBUTRIN XL Take 150 mg by mouth  daily.   buPROPion 300 MG 24 hr tablet Commonly known as: WELLBUTRIN XL Take 300 mg by mouth daily.   CALCIUM-MAGNESIUM-ZINC PO Take 1 tablet by mouth 2 (two) times daily.   celecoxib 100 MG capsule Commonly known as: CELEBREX Take 100 mg by mouth 2 (two) times daily.   colchicine 0.6 MG tablet Take 0.6 mg by mouth every other day.   cyanocobalamin 1000 MCG tablet Take 1,000 mcg by mouth daily.   fluticasone 50 MCG/ACT nasal spray Commonly known as: FLONASE Place 1 spray into both nostrils daily as needed for allergies.   HYDROmorphone 4 MG tablet Commonly known as: DILAUDID Take 4 mg by mouth every 6 (six) hours as needed for moderate pain.   multivitamin with minerals Tabs tablet Take 1 tablet by mouth daily.   OVER THE COUNTER MEDICATION Take 1 tablet by mouth daily.   pantoprazole 40 MG tablet Commonly known as: PROTONIX Take 40 mg by mouth daily as needed (acid reflux). Acid reflux   polyethylene glycol powder 17 GM/SCOOP powder Commonly known as: GLYCOLAX/MIRALAX Take 17 g by mouth daily as needed for mild constipation. constipation   Respiratory Therapy Supplies Devi CPAP @@ hs   vitamin C 100 MG tablet Take 100 mg by mouth daily.   Vitamin D 50 MCG (2000 UT) Caps Take 2,000 Units by mouth daily.         SignedCooper Render Caoimhe Rose 12/29/2021, 9:44 AM

## 2022-05-24 ENCOUNTER — Other Ambulatory Visit: Payer: Self-pay

## 2022-05-24 ENCOUNTER — Inpatient Hospital Stay
Admission: RE | Admit: 2022-05-24 | Discharge: 2022-05-24 | Disposition: A | Discharge status: Home / Self Care | Payer: Self-pay | Source: Ambulatory Visit | Attending: Nurse Practitioner | Admitting: Nurse Practitioner

## 2022-05-24 ENCOUNTER — Telehealth: Payer: Self-pay

## 2022-05-24 DIAGNOSIS — C884 Extranodal marginal zone B-cell lymphoma of mucosa-associated lymphoid tissue [MALT-lymphoma]: Secondary | ICD-10-CM

## 2022-05-24 NOTE — Telephone Encounter (Signed)
Lindsey Rose has informed us that she recently underwent a CT scan at [facility] and would like to cancel her upcoming CT scan appointment at [facility]. Dr. Truett Perna has approved the cancellation. I have reached out to [facility] to facilitate sharing the films via [employer].

## 2022-06-23 ENCOUNTER — Inpatient Hospital Stay: Payer: Medicare PPO | Attending: Oncology | Admitting: Oncology

## 2022-06-28 ENCOUNTER — Other Ambulatory Visit (HOSPITAL_BASED_OUTPATIENT_CLINIC_OR_DEPARTMENT_OTHER)

## 2022-06-28 ENCOUNTER — Telehealth: Payer: Self-pay | Admitting: *Deleted

## 2022-06-28 NOTE — Telephone Encounter (Signed)
Lindsey Rose reports she needs to move her May appointment to June due to being out of town. Already had her CT chest included in abd/pelvis in April at [place]. Reschedule completed.

## 2022-07-08 ENCOUNTER — Ambulatory Visit: Admitting: Oncology

## 2022-08-02 ENCOUNTER — Inpatient Hospital Stay: Payer: Medicare PPO | Attending: Oncology | Admitting: Oncology

## 2022-08-02 VITALS — BP 119/76 | HR 84 | Temp 98.1°F | Resp 18 | Ht 61.0 in | Wt 102.3 lb

## 2022-08-02 DIAGNOSIS — C884 Extranodal marginal zone B-cell lymphoma of mucosa-associated lymphoid tissue [MALT-lymphoma]: Secondary | ICD-10-CM | POA: Diagnosis not present

## 2022-08-02 DIAGNOSIS — Z853 Personal history of malignant neoplasm of breast: Secondary | ICD-10-CM | POA: Insufficient documentation

## 2022-08-02 DIAGNOSIS — Z8572 Personal history of non-Hodgkin lymphomas: Secondary | ICD-10-CM | POA: Diagnosis present

## 2022-08-02 NOTE — Progress Notes (Signed)
[facility] OFFICE PROGRESS NOTE   Diagnosis: MALT lymphoma, breast cancer  INTERVAL HISTORY:   Lindsey Rose returns as scheduled.  She feels well.  She underwent a cervical discectomy procedure in November 2023.  She reports resolution of neck pain and arm numbness/weakness.  She has chronic low back pain and is followed in a pain clinic.  No other complaint.  Objective:  Vital signs in last 24 hours:  Blood pressure 119/76, pulse 84, temperature 98.1 F (36.7 C), temperature source Oral, resp. rate 18, height 5\' 1"  (1.549 m), weight 102 lb 4.8 oz (46.4 kg), SpO2 98 %.  Lymphatics: No cervical, supraclavicular, axillary, or inguinal nodes Resp: Lungs clear bilaterally Cardio: Regular rate and rhythm GI: No hepatosplenomegaly, nontender Vascular: No leg edema Breast: Bilateral mastectomy with implants in place.  No evidence for chest wall tumor recurrence  Lab Results:  Lab Results  Component Value Date   WBC 7.5 12/21/2021   HGB 12.1 12/21/2021   HCT 36.3 12/21/2021   MCV 92.4 12/21/2021   PLT 213 12/21/2021   NEUTROABS 3.8 11/19/2020    CMP  Lab Results  Component Value Date   NA 137 11/19/2020   K 3.8 11/19/2020   CL 102 11/19/2020   CO2 29 11/19/2020   GLUCOSE 133 (H) 11/19/2020   BUN 19 11/19/2020   CREATININE 0.81 11/19/2020   CALCIUM 9.0 11/19/2020   PROT 5.3 (L) 08/27/2016   ALBUMIN 2.5 (L) 08/27/2016   AST 19 08/27/2016   ALT 13 (L) 08/27/2016   ALKPHOS 67 08/27/2016   BILITOT 0.2 (L) 08/27/2016   GFRNONAA >60 11/19/2020   GFRAA >60 08/28/2016    Medications: I have reviewed the patient's current medications.   Assessment/Plan: Bilateral invasive lobular breast cancer diagnosed in 04/1999, status post bilateral mastectomy with implants.  She was treated with adjuvant AC chemotherapy, and began tamoxifen in 08/1999.  She completed 5 years of tamoxifen, and began Femara in 07/2004.  She completed 5 years of Femara in 07/2009.   2.   Status post left shoulder surgery with a decreased range of motion at the left shoulder. 3.  Chronic arthralgias. 4.  Status post right total knee replacement 5.  Ongoing tobacco use-we referred her to the lung cancer screening program on 09/03/2015 6.  History of diverticulitis  7. Family history of breast cancer and gastric cancer, she is confirmed to have a CDH1 mutation Negative upper endoscopy November 2018 Negative upper endoscopy 02/16/2018 Upper endoscopy 06/27/2019 mild inflammation in the stomach, no mass, biopsies revealed chronic inactive gastritis Colonoscopy 06/27/2019 patent colocolonic anastomosis, biopsies to rule out colitis were negative Upper endoscopy 09/16/2020-normal larynx and esophagus; 2 cm hiatal hernia.  Normal stomach, biopsied, normal examined duodenum.  Stomach biopsy with chronic inactive gastritis. 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 Wedge resection of a mildly hypermetabolic right upper lobe nodule 08/25/2016-atypical lymphoid proliferation. B-cell clonality by PCR is positive. 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  01/28/2019 CT chest/abdomen/pelvis-new poorly defined 2.6 x 2.1 cm lesion right lung apex CT chest 04/22/2019-stable 2.1 cm mixed attenuation lesion right lung apex. CT chest 08/21/2019-interval growth of a subsolid right apical nodule, new groundglass attenuation nodule in the subpleural right middle lobe CT chest 02/10/2020-previous groundglass opacities in the right upper lobe and middle lobes have resolved, no evidence of recurrent or metastatic disease, stable 3 mm triangular subpleural posterior left lower lobe nodule, no  new nodules CT chest 11/18/2020-no evidence of malignancy, basilar subpleural groundglass and scattered reticular densities CT chest 08/12/2021-no evidence of malignancy, stable changes of  interstitial lung disease, COPD CTs 04/08/2022-findings, emphysema 11.  01/28/2019 CT left shoulder-no evidence of metastatic disease.  No evidence of loosening of the left shoulder arthroplasty.  Old nonunion fracture at the base of the acromium with secondary degenerative changes at the pseudoarticulation.  Scapula otherwise normal. 12.  Right neck/upper back pain and right hand weakness/numbness-MRI cervical spine 11/16/2021-generative changes with spinal canal stenosis at C3-4, C4-5, and C6-7.  More pronounced mass effect on the cord at C3-4 and C4-5 12/28/2021-C3-4, C4-5 cervical discectomy        Disposition: Lindsey Rose is in clinical remission from breast cancer and MALT lymphoma.  She continues endoscopic surveillance with gastroenterology based on the [facility] 1 mutation.  She is also followed by gastroenterology for weight loss.  Her weight has stabilized over the past several months.  She will return for an office visit in 6 months.  Thornton Papas, MD  08/02/2022  11:44 AM

## 2023-01-31 ENCOUNTER — Inpatient Hospital Stay: Payer: Medicare PPO | Attending: Oncology | Admitting: Oncology

## 2023-01-31 VITALS — BP 143/78 | HR 82 | Temp 98.1°F | Resp 18 | Ht 61.0 in | Wt 101.7 lb

## 2023-01-31 DIAGNOSIS — Z8572 Personal history of non-Hodgkin lymphomas: Secondary | ICD-10-CM | POA: Insufficient documentation

## 2023-01-31 DIAGNOSIS — Z853 Personal history of malignant neoplasm of breast: Secondary | ICD-10-CM | POA: Diagnosis not present

## 2023-01-31 DIAGNOSIS — C884 Extranodal marginal zone b-cell lymphoma of mucosa-associated lymphoid tissue (malt-lymphoma) not having achieved remission: Secondary | ICD-10-CM

## 2023-01-31 NOTE — Progress Notes (Signed)
[facility] OFFICE PROGRESS NOTE   Diagnosis: Breast cancer, MALT lymphoma  INTERVAL HISTORY:   Ms. Keatley returns as scheduled.  She generally feels well.  She continues to have a poor appetite.  She is followed by gastroenterology and rheumatology.  She reports arthritis pain.  She underwent endoscopy and colonoscopy in November (we do not have the report available today).  Objective:  Vital signs in last 24 hours:  Blood pressure (!) 143/78, pulse 82, temperature 98.1 F (36.7 C), temperature source Temporal, resp. rate 18, height 5\' 1"  (1.549 m), weight 101 lb 11.2 oz (46.1 kg), SpO2 98%.    Lymphatics: No cervical, supraclavicular, axillary, or inguinal nodes Resp: Lungs clear bilaterally with a prolonged aspiratory phase, no respiratory distress Cardio: Regular rate and rhythm GI: No hepatosplenomegaly, no mass, nontender Vascular: No leg edema : Status post bilateral mastectomy with implants in place.  No evidence for chest wall tumor recurrence   Lab Results:  Lab Results  Component Value Date   WBC 7.5 12/21/2021   HGB 12.1 12/21/2021   HCT 36.3 12/21/2021   MCV 92.4 12/21/2021   PLT 213 12/21/2021   NEUTROABS 3.8 11/19/2020    CMP  Lab Results  Component Value Date   NA 137 11/19/2020   K 3.8 11/19/2020   CL 102 11/19/2020   CO2 29 11/19/2020   GLUCOSE 133 (H) 11/19/2020   BUN 19 11/19/2020   CREATININE 0.81 11/19/2020   CALCIUM 9.0 11/19/2020   PROT 5.3 (L) 08/27/2016   ALBUMIN 2.5 (L) 08/27/2016   AST 19 08/27/2016   ALT 13 (L) 08/27/2016   ALKPHOS 67 08/27/2016   BILITOT 0.2 (L) 08/27/2016   GFRNONAA >60 11/19/2020   GFRAA >60 08/28/2016     Medications: I have reviewed the patient's current medications.   Assessment/Plan: Bilateral invasive lobular breast cancer diagnosed in 04/1999, status post bilateral mastectomy with implants.  She was treated with adjuvant AC chemotherapy, and began tamoxifen in 08/1999.  She completed  5 years of tamoxifen, and began Femara in 07/2004.  She completed 5 years of Femara in 07/2009.   2.  Status post left shoulder surgery with a decreased range of motion at the left shoulder. 3.  Chronic arthralgias. 4.  Status post right total knee replacement 5.  Ongoing tobacco use-we referred her to the lung cancer screening program on 09/03/2015 6.  History of diverticulitis  7. Family history of breast cancer and gastric cancer, she is confirmed to have a CDH1 mutation Negative upper endoscopy November 2018 Negative upper endoscopy 02/16/2018 Upper endoscopy 06/27/2019 mild inflammation in the stomach, no mass, biopsies revealed chronic inactive gastritis Colonoscopy 06/27/2019 patent colocolonic anastomosis, biopsies to rule out colitis were negative Upper endoscopy 09/16/2020-normal larynx and esophagus; 2 cm hiatal hernia.  Normal stomach, biopsied, normal examined duodenum.  Stomach biopsy with chronic inactive gastritis. 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 Wedge resection of a mildly hypermetabolic right upper lobe nodule 08/25/2016-atypical lymphoid proliferation. B-cell clonality by PCR is positive. 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  01/28/2019 CT chest/abdomen/pelvis-new poorly defined 2.6 x 2.1 cm lesion right lung apex CT chest 04/22/2019-stable 2.1 cm mixed attenuation lesion right lung apex. CT chest 08/21/2019-interval growth of a subsolid right apical nodule, new groundglass attenuation nodule in the subpleural right middle lobe CT chest 02/10/2020-previous groundglass opacities in the right upper lobe and middle lobes have resolved, no evidence  of recurrent or metastatic disease, stable 3 mm triangular subpleural posterior left lower lobe nodule, no new nodules CT chest 11/18/2020-no evidence of malignancy, basilar subpleural groundglass and  scattered reticular densities CT chest 08/12/2021-no evidence of malignancy, stable changes of interstitial lung disease, COPD CTs 04/08/2022-no acute findings findings, emphysema 11.  01/28/2019 CT left shoulder-no evidence of metastatic disease.  No evidence of loosening of the left shoulder arthroplasty.  Old nonunion fracture at the base of the acromium with secondary degenerative changes at the pseudoarticulation.  Scapula otherwise normal. 12.  Right neck/upper back pain and right hand weakness/numbness-MRI cervical spine 11/16/2021-generative changes with spinal canal stenosis at C3-4, C4-5, and C6-7.  More pronounced mass effect on the cord at C3-4 and C4-5 12/28/2021-C3-4, C4-5 cervical discectomy        Disposition: Ms. Mcgatha remains in clinical remission from lymphoma and breast cancer.  She continues follow-up with gastroenterology for surveillance in the setting of a [facility] 1 mutation pancreatic insufficiency, and weight loss.  I encouraged her to increase calorie intake.  She is followed by rheumatology for arthritis and pain.  She will return for an office visit in 6 months.  Thornton Papas, MD  01/31/2023  11:44 AM

## 2023-08-01 ENCOUNTER — Inpatient Hospital Stay: Attending: Oncology | Admitting: Oncology

## 2023-08-01 VITALS — BP 133/80 | HR 79 | Temp 97.9°F | Resp 18 | Ht 61.0 in | Wt 100.8 lb

## 2023-08-01 DIAGNOSIS — Z8572 Personal history of non-Hodgkin lymphomas: Secondary | ICD-10-CM | POA: Diagnosis present

## 2023-08-01 DIAGNOSIS — Z853 Personal history of malignant neoplasm of breast: Secondary | ICD-10-CM | POA: Insufficient documentation

## 2023-08-01 DIAGNOSIS — C884 Extranodal marginal zone b-cell lymphoma of mucosa-associated lymphoid tissue (malt-lymphoma) not having achieved remission: Secondary | ICD-10-CM | POA: Diagnosis not present

## 2023-08-01 NOTE — Progress Notes (Signed)
 [facility] OFFICE PROGRESS NOTE   Diagnosis: MALT lymphoma, breast cancer  INTERVAL HISTORY:   Lindsey Rose returns as scheduled.  She feels well.  Good appetite.  She continues to take Dilaudid  for arthritis pain.  She underwent an upper endoscopy in November (we do not have this report available today).  Objective:  Vital signs in last 24 hours:  Blood pressure 133/80, pulse 79, temperature 97.9 F (36.6 C), temperature source Temporal, resp. rate 18, height 5' 1 (1.549 m), weight 100 lb 12.8 oz (45.7 kg), SpO2 98%.   Lymphatics: No cervical, supraclavicular, axillary, or inguinal nodes Resp: Lungs clear bilaterally Cardio: Regular rate and rhythm GI: No mass, nontender, no hepatosplenomegaly  Vascular: Leg edema Breast: Bilateral mastectomy.  No evidence for local tumor recurrence   Lab Results:  Lab Results  Component Value Date   WBC 7.5 12/21/2021   HGB 12.1 12/21/2021   HCT 36.3 12/21/2021   MCV 92.4 12/21/2021   PLT 213 12/21/2021   NEUTROABS 3.8 11/19/2020    CMP  Lab Results  Component Value Date   NA 137 11/19/2020   K 3.8 11/19/2020   CL 102 11/19/2020   CO2 29 11/19/2020   GLUCOSE 133 (H) 11/19/2020   BUN 19 11/19/2020   CREATININE 0.81 11/19/2020   CALCIUM 9.0 11/19/2020   PROT 5.3 (L) 08/27/2016   ALBUMIN 2.5 (L) 08/27/2016   AST 19 08/27/2016   ALT 13 (L) 08/27/2016   ALKPHOS 67 08/27/2016   BILITOT 0.2 (L) 08/27/2016   GFRNONAA >60 11/19/2020   GFRAA >60 08/28/2016    No results found for: CEA1, CEA, CAN199, CA125  Lab Results  Component Value Date   INR 0.96 08/23/2016   LABPROT 12.8 08/23/2016    Imaging:  No results found.  Medications: I have reviewed the patient's current medications.   Assessment/Plan: Bilateral invasive lobular breast cancer diagnosed in 04/1999, status post bilateral mastectomy with implants.  She was treated with adjuvant AC chemotherapy, and began tamoxifen in 08/1999.  She  completed 5 years of tamoxifen, and began Femara in 07/2004.  She completed 5 years of Femara in 07/2009.   2.  Status post left shoulder surgery with a decreased range of motion at the left shoulder. 3.  Chronic arthralgias. 4.  Status post right total knee replacement 5.  Ongoing tobacco use-we referred her to the lung cancer screening program on 09/03/2015 6.  History of diverticulitis  7. Family history of breast cancer and gastric cancer, she is confirmed to have a CDH1 mutation Negative upper endoscopy November 2018 Negative upper endoscopy 02/16/2018 Upper endoscopy 06/27/2019 mild inflammation in the stomach, no mass, biopsies revealed chronic inactive gastritis Colonoscopy 06/27/2019 patent colocolonic anastomosis, biopsies to rule out colitis were negative Upper endoscopy 09/16/2020-normal larynx and esophagus; 2 cm hiatal hernia.  Normal stomach, biopsied, normal examined duodenum.  Stomach biopsy with chronic inactive gastritis. 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 Wedge resection of a mildly hypermetabolic right upper lobe nodule 08/25/2016-atypical lymphoid proliferation. B-cell clonality by PCR is positive. 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  01/28/2019 CT chest/abdomen/pelvis-new poorly defined 2.6 x 2.1 cm lesion right lung apex CT chest 04/22/2019-stable 2.1 cm mixed attenuation lesion right lung apex. CT chest 08/21/2019-interval growth of a subsolid right apical nodule, new groundglass attenuation nodule in the subpleural right middle lobe CT chest 02/10/2020-previous groundglass opacities in the right upper lobe and  middle lobes have resolved, no evidence of recurrent or metastatic disease, stable 3 mm triangular subpleural posterior left lower lobe nodule, no new nodules CT chest 11/18/2020-no evidence of malignancy, basilar subpleural  groundglass and scattered reticular densities CT chest 08/12/2021-no evidence of malignancy, stable changes of interstitial lung disease, COPD CTs 04/08/2022-no acute findings findings, emphysema 11.  01/28/2019 CT left shoulder-no evidence of metastatic disease.  No evidence of loosening of the left shoulder arthroplasty.  Old nonunion fracture at the base of the acromium with secondary degenerative changes at the pseudoarticulation.  Scapula otherwise normal. 12.  Right neck/upper back pain and right hand weakness/numbness-MRI cervical spine 11/16/2021-generative changes with spinal canal stenosis at C3-4, C4-5, and C6-7.  More pronounced mass effect on the cord at C3-4 and C4-5 12/28/2021-C3-4, C4-5 cervical discectomy        Disposition: Lindsey Rose is in clinical remission from breast cancer and the pulmonary lymphoma.  She will return for an office visit and surveillance chest CT in 6 months.  She continues endoscopic surveillance with Dr. Rosiland Cooks.  Coni Deep, MD  08/01/2023  3:05 PM

## 2024-01-31 ENCOUNTER — Inpatient Hospital Stay: Attending: Oncology | Admitting: Oncology

## 2024-01-31 ENCOUNTER — Ambulatory Visit (HOSPITAL_BASED_OUTPATIENT_CLINIC_OR_DEPARTMENT_OTHER): Admission: RE | Admit: 2024-01-31 | Discharge: 2024-01-31 | Attending: Oncology | Admitting: Oncology

## 2024-01-31 ENCOUNTER — Encounter: Payer: Self-pay | Admitting: *Deleted

## 2024-01-31 VITALS — BP 106/67 | HR 81 | Temp 97.7°F | Resp 16 | Wt 101.0 lb

## 2024-01-31 DIAGNOSIS — I700 Atherosclerosis of aorta: Secondary | ICD-10-CM | POA: Insufficient documentation

## 2024-01-31 DIAGNOSIS — Z853 Personal history of malignant neoplasm of breast: Secondary | ICD-10-CM | POA: Insufficient documentation

## 2024-01-31 DIAGNOSIS — R918 Other nonspecific abnormal finding of lung field: Secondary | ICD-10-CM | POA: Diagnosis not present

## 2024-01-31 DIAGNOSIS — C884 Extranodal marginal zone b-cell lymphoma of mucosa-associated lymphoid tissue (malt-lymphoma) not having achieved remission: Secondary | ICD-10-CM | POA: Diagnosis not present

## 2024-01-31 DIAGNOSIS — J439 Emphysema, unspecified: Secondary | ICD-10-CM | POA: Insufficient documentation

## 2024-01-31 DIAGNOSIS — Z15068 Genetic susceptibility to other malignant neoplasm of digestive system: Secondary | ICD-10-CM | POA: Insufficient documentation

## 2024-01-31 DIAGNOSIS — Z8579 Personal history of other malignant neoplasms of lymphoid, hematopoietic and related tissues: Secondary | ICD-10-CM | POA: Insufficient documentation

## 2024-01-31 DIAGNOSIS — F17210 Nicotine dependence, cigarettes, uncomplicated: Secondary | ICD-10-CM | POA: Diagnosis not present

## 2024-01-31 NOTE — Progress Notes (Signed)
 Referral for New pt appt faxed to [facility] at [phone]

## 2024-01-31 NOTE — Progress Notes (Signed)
  [facility] OFFICE PROGRESS NOTE   Diagnosis: MALT lymphoma of the lung, breast cancer  INTERVAL HISTORY:   Lindsey Rose returns as scheduled.  She feels well.  Good appetite.  No difficulty with swallowing.  No nausea.  She continues smoking.  She underwent an upper endoscopy 12/19/2023 (the EGD report is not available today).  Objective:  Vital signs in last 24 hours:  Blood pressure 106/67, pulse 81, temperature 97.7 F (36.5 C), temperature source Temporal, resp. rate 16, weight 101 lb (45.8 kg), SpO2 96%.    Lymphatics: No cervical, supraclavicular, axillary, or inguinal nodes Resp: Distant breath sounds, no respiratory distress Cardio: Regular rate and rhythm GI: No hepatosplenomegaly, no mass, nontender Vascular: No leg edema Breast: Bilateral mastectomy with implants in place.  No evidence for chest wall tumor recurrence.    Lab Results:  Lab Results  Component Value Date   WBC 7.5 12/21/2021   HGB 12.1 12/21/2021   HCT 36.3 12/21/2021   MCV 92.4 12/21/2021   PLT 213 12/21/2021   NEUTROABS 3.8 11/19/2020    CMP  Lab Results  Component Value Date   NA 137 11/19/2020   K 3.8 11/19/2020   CL 102 11/19/2020   CO2 29 11/19/2020   GLUCOSE 133 (H) 11/19/2020   BUN 19 11/19/2020   CREATININE 0.81 11/19/2020   CALCIUM 9.0 11/19/2020   PROT 5.3 (L) 08/27/2016   ALBUMIN 2.5 (L) 08/27/2016   AST 19 08/27/2016   ALT 13 (L) 08/27/2016   ALKPHOS 67 08/27/2016   BILITOT 0.2 (L) 08/27/2016   GFRNONAA >60 11/19/2020   GFRAA >60 08/28/2016    No results found for: CEA1, CEA, CAN199, CA125  Lab Results  Component Value Date   INR 0.96 08/23/2016   LABPROT 12.8 08/23/2016    Imaging:  No results found.  Medications: I have reviewed the patient's current medications.   Assessment/Plan: Bilateral invasive lobular breast cancer diagnosed in 04/1999, status post bilateral mastectomy with implants.  She was treated with adjuvant AC  chemotherapy, and began tamoxifen in 08/1999.  She completed 5 years of tamoxifen, and began Femara in 07/2004.  She completed 5 years of Femara in 07/2009.   2.  Status post left shoulder surgery with a decreased range of motion at the left shoulder. 3.  Chronic arthralgias. 4.  Status post right total knee replacement 5.  Ongoing tobacco use-we referred her to the lung cancer screening program on 09/03/2015 6.  History of diverticulitis  7. Family history of breast cancer and gastric cancer, she is confirmed to have a CDH1 mutation Negative upper endoscopy November 2018 Negative upper endoscopy 02/16/2018 Upper endoscopy 06/27/2019 mild inflammation in the stomach, no mass, biopsies revealed chronic inactive gastritis Colonoscopy 06/27/2019 patent colocolonic anastomosis, biopsies to rule out colitis were negative Upper endoscopy 09/16/2020-normal larynx and esophagus; 2 cm hiatal hernia.  Normal stomach, biopsied, normal examined duodenum.  Stomach biopsy with chronic inactive gastritis. 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 Wedge resection of a mildly hypermetabolic right upper lobe nodule 08/25/2016-atypical lymphoid proliferation. B-cell clonality by PCR is positive. 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  01/28/2019 CT chest/abdomen/pelvis-new poorly defined 2.6 x 2.1 cm lesion right lung apex CT chest 04/22/2019-stable 2.1 cm mixed attenuation lesion right lung apex. CT chest 08/21/2019-interval growth of a subsolid right apical nodule, new groundglass attenuation nodule in the subpleural right middle lobe CT chest 02/10/2020-previous groundglass  opacities in the right upper lobe and middle lobes have resolved, no evidence of recurrent or metastatic disease, stable 3 mm triangular subpleural posterior left lower lobe nodule, no new nodules CT chest 11/18/2020-no  evidence of malignancy, basilar subpleural groundglass and scattered reticular densities CT chest 08/12/2021-no evidence of malignancy, stable changes of interstitial lung disease, COPD CTs 04/08/2022-no acute findings findings, emphysema 11.  01/28/2019 CT left shoulder-no evidence of metastatic disease.  No evidence of loosening of the left shoulder arthroplasty.  Old nonunion fracture at the base of the acromium with secondary degenerative changes at the pseudoarticulation.  Scapula otherwise normal. 12.  Right neck/upper back pain and right hand weakness/numbness-MRI cervical spine 11/16/2021-generative changes with spinal canal stenosis at C3-4, C4-5, and C6-7.  More pronounced mass effect on the cord at C3-4 and C4-5 12/28/2021-C3-4, C4-5 cervical discectomy       Disposition: Lindsey Rose has a remote history of breast cancer.  She remains in clinical remission.  She has a history of MALT lymphoma of the right lung diagnosed in 2018.  She will have a surveillance chest CT today.  She continues to smoke.  She should continue surveillance chest imaging for cancer screening.  I recommended she discontinue smoking.  She has a CDH1 mutation and remains at risk for developing gastric cancer.  She will continue endoscopic surveillance with Dr. Amadeo.  Lindsey Rose lives a significant distance from our office.  She requests a transfer to [facility] oncology in [place].  We will make a referral.  I am available to see her as needed.  Arley Hof, MD  01/31/2024  12:15 PM

## 2024-02-06 ENCOUNTER — Telehealth: Payer: Self-pay | Admitting: *Deleted

## 2024-02-06 NOTE — Telephone Encounter (Signed)
 Received fax from [facility] that they have been trying to reach patient for appointment without success. This RN left VM with phone # [phone] to make appointment with [facility]'s VM. Also sent MyChart message.

## 2024-02-20 ENCOUNTER — Ambulatory Visit: Payer: Self-pay | Admitting: Oncology

## 2024-02-22 ENCOUNTER — Telehealth: Payer: Self-pay | Admitting: *Deleted

## 2024-02-22 NOTE — Telephone Encounter (Signed)
 Per Dr. Cloretta: 01/31/24 CT chest shows new nodular focus in left lung. Could be inflammatory. She will need f/u with a repeat CT and possibly a PET. LVM for Lindsey Rose to call office re: CT results. Faxed MD note and copy of report to [facility] [phone]) and email sent to [email or web address] to have 12/17 images sent to [facility].  Called reading room and requested direct comparison of 01/31/24 to prior outside images from 05/24/22, which have been uploaded.

## 2024-02-23 ENCOUNTER — Telehealth: Payer: Self-pay | Admitting: *Deleted

## 2024-02-23 NOTE — Telephone Encounter (Signed)
 Informed Lindsey Rose that the CT chest shows new nodular focus in left lung. Could be inflammatory. She will need f/u with a repeat CT and possibly a PET. Informed her that images and reports sent to her new [facility] office. She did report a recent URI and agrees to follow up as suggested.
